# Patient Record
Sex: Female | Born: 1965
Health system: Southern US, Community
[De-identification: ages and names within clinical notes are randomized; demographics above are authoritative.]

## PROBLEM LIST (undated history)

## (undated) DIAGNOSIS — E039 Hypothyroidism, unspecified: Secondary | ICD-10-CM

## (undated) DIAGNOSIS — K219 Gastro-esophageal reflux disease without esophagitis: Secondary | ICD-10-CM

## (undated) HISTORY — DX: Hypothyroidism, unspecified: E03.9

---

## 1988-07-07 HISTORY — PX: CERVICAL CONE BIOPSY: SUR198

## 1997-07-07 HISTORY — PX: TUBAL LIGATION: SHX77

## 2003-07-08 HISTORY — PX: BREAST BIOPSY: SHX20

## 2007-01-06 ENCOUNTER — Ambulatory Visit: Payer: Self-pay | Admitting: Obstetrics and Gynecology

## 2009-01-22 LAB — HM COLONOSCOPY: HM Colonoscopy: NORMAL

## 2009-03-29 ENCOUNTER — Ambulatory Visit: Payer: Self-pay | Admitting: Sports Medicine

## 2009-04-10 ENCOUNTER — Ambulatory Visit: Payer: Self-pay | Admitting: Obstetrics and Gynecology

## 2009-11-13 ENCOUNTER — Ambulatory Visit: Payer: Self-pay | Admitting: Obstetrics and Gynecology

## 2009-11-16 ENCOUNTER — Ambulatory Visit: Payer: Self-pay | Admitting: Obstetrics and Gynecology

## 2010-07-07 HISTORY — PX: NOVASURE ABLATION: SHX5394

## 2011-02-10 ENCOUNTER — Ambulatory Visit: Payer: Self-pay | Admitting: Obstetrics and Gynecology

## 2012-02-17 ENCOUNTER — Ambulatory Visit: Payer: Self-pay | Admitting: Obstetrics and Gynecology

## 2013-05-12 LAB — TSH: TSH: 2.19 u[IU]/mL (ref 0.41–5.90)

## 2013-05-25 ENCOUNTER — Ambulatory Visit: Payer: Self-pay | Admitting: Gastroenterology

## 2013-11-14 ENCOUNTER — Encounter: Payer: Self-pay | Admitting: Podiatry

## 2013-11-14 ENCOUNTER — Ambulatory Visit (INDEPENDENT_AMBULATORY_CARE_PROVIDER_SITE_OTHER): Payer: BC Managed Care – PPO

## 2013-11-14 ENCOUNTER — Ambulatory Visit (INDEPENDENT_AMBULATORY_CARE_PROVIDER_SITE_OTHER): Payer: BC Managed Care – PPO | Admitting: Podiatry

## 2013-11-14 DIAGNOSIS — M79609 Pain in unspecified limb: Secondary | ICD-10-CM

## 2013-11-14 DIAGNOSIS — M79673 Pain in unspecified foot: Secondary | ICD-10-CM

## 2013-11-14 DIAGNOSIS — M6740 Ganglion, unspecified site: Secondary | ICD-10-CM

## 2013-11-14 DIAGNOSIS — M722 Plantar fascial fibromatosis: Secondary | ICD-10-CM

## 2013-11-14 DIAGNOSIS — M67472 Ganglion, left ankle and foot: Secondary | ICD-10-CM

## 2013-11-14 NOTE — Progress Notes (Signed)
   Subjective:    Patient ID: Bethany Bailey, female    DOB: Mar 11, 1966, 48 y.o.   MRN: [id number]  HPI Comments: i did have pain on the top of my left foot. It doesn't hurt today. The pain in random. The pain has been off and on for 3 weeks. Its basically stopped hurting. i dont do anything for my foot   Foot Pain      Review of Systems  All other systems reviewed and are negative.      Objective:   Physical Exam: I reviewed her past medical history medications allergies surgeries social history and review of systems. Pulses are strongly palpable bilateral. Deep tendon reflexes are intact bilateral muscle strength is 5 over 5 dorsiflexors plantar flexors inverters everters all intrinsic musculature is intact. Orthopedic evaluation demonstrates all joints distal to the ankle a full range of motion without crepitus. Has a small dorsal exostosis to the base of the first metatarsal with an overlying ganglion cyst. This is more than likely associated with past trauma and irritation due to shoe gear. She also has a history of plantar fasciitis with mild tenderness on palpation. Radiographic evaluation does demonstrate dorsal spurring and soft tissue increase in density overlying the area in question.        Assessment & Plan:  Assessment: Plantar fasciitis bilateral. Dorsal tarsal exostosis with overlying ganglion left foot.  Plan: Scant today for orthotics. Injection dexamethasone and local anesthetic to the point of maximal tenderness left foot.

## 2013-11-15 DIAGNOSIS — K2970 Gastritis, unspecified, without bleeding: Secondary | ICD-10-CM | POA: Insufficient documentation

## 2013-11-15 DIAGNOSIS — R12 Heartburn: Secondary | ICD-10-CM | POA: Insufficient documentation

## 2013-11-15 DIAGNOSIS — K449 Diaphragmatic hernia without obstruction or gangrene: Secondary | ICD-10-CM | POA: Insufficient documentation

## 2013-11-22 ENCOUNTER — Encounter: Payer: Self-pay | Admitting: General Surgery

## 2013-11-24 ENCOUNTER — Encounter: Payer: Self-pay | Admitting: *Deleted

## 2013-11-24 NOTE — Progress Notes (Signed)
Sent pt post card letting her know orthotics are here. 

## 2013-12-12 ENCOUNTER — Encounter: Payer: Self-pay | Admitting: General Surgery

## 2013-12-12 ENCOUNTER — Ambulatory Visit (INDEPENDENT_AMBULATORY_CARE_PROVIDER_SITE_OTHER): Payer: BC Managed Care – PPO | Admitting: General Surgery

## 2013-12-12 VITALS — BP 126/76 | HR 76 | Resp 12 | Ht 65.0 in | Wt 193.0 lb

## 2013-12-12 DIAGNOSIS — R1031 Right lower quadrant pain: Secondary | ICD-10-CM

## 2013-12-12 NOTE — Patient Instructions (Addendum)
Patient may take three 200mg  ibuprofen  three times daily. Patient to call and let us know how you are doing in two weeks.

## 2013-12-12 NOTE — Progress Notes (Signed)
aPatient ID: Verne Grain, female   DOB: August 16, 1965, 48 y.o.   MRN: [id number]  Chief Complaint  Patient presents with  . Other    inguinal hernia    HPI Bethany Bailey is a 48 y.o. female here today for a evaluation of an bilateral inguinal hernia. Patient states she lifted some heavy material on about one year ago. She states she feel sharp burning pain occasionally in her left groin area. In the right inguinal area she states she feel pain daily.   The patient reports since  August 2014 she's had a constant throbbing pain in the right groin, especially when she lifts her leg. Once it develops it willing her throughout the day. On rare occasions the discomfort has been present when arising in the morning. She is not appreciated an increase in frequency or severity. This does not interfere with her daily activities. No triggering event is recalled.  She occasionally has some burning discomfort in the left groin, but this is intermittent and does not appear to be related to those symptoms in the right groin. No difficulty with bowel bladder function. No change in appetite.  HPI  Past Medical History  Diagnosis Date  . Thyroid disease   . Gastritis   . Hypothyroid     Past Surgical History  Procedure Laterality Date  . Tubal ligation  1999  . Novasure ablation  2012  . Colonoscopy      more than five years ago    No family history on file.  Social History History  Substance Use Topics  . Smoking status: Never Smoker   . Smokeless tobacco: Never Used  . Alcohol Use: Yes    Allergies  Allergen Reactions  . Aspirin Other (See Comments)    Ears ring  . Erythromycin Nausea And Vomiting  . Sulfa Antibiotics Rash    Current Outpatient Prescriptions  Medication Sig Dispense Refill  . Black Cohosh 175 MG CAPS Take by mouth.      . cholecalciferol (VITAMIN D) 400 UNITS TABS tablet Take 400 Units by mouth daily.      . cyclobenzaprine (FLEXERIL) 10 MG tablet Take 1 tablet by mouth  daily.      Marland Kitchen ESTRACE VAGINAL 0.1 MG/GM vaginal cream       . levothyroxine (SYNTHROID, LEVOTHROID) 100 MCG tablet Take 100 mcg by mouth daily before breakfast.      . Omega-3 Fatty Acids (FISH OIL) 1000 MG CAPS Take 1 capsule by mouth daily.      . Ranitidine HCl (ZANTAC PO) Take by mouth 2 (two) times daily.       No current facility-administered medications for this visit.    Review of Systems Review of Systems  Constitutional: Negative.   Respiratory: Negative.   Cardiovascular: Negative.     Blood pressure 126/76, pulse 76, resp. rate 12, height 5\' 5"  (1.651 m), weight 193 lb (87.544 kg), last menstrual period 12/11/2013.  Physical Exam Physical Exam  Constitutional: She is oriented to person, place, and time. She appears well-developed and well-nourished.  Eyes: Conjunctivae are normal. No scleral icterus.  Neck: Neck supple.  Cardiovascular: Normal rate, regular rhythm and normal heart sounds.   Pulmonary/Chest: Effort normal and breath sounds normal.  Abdominal: Soft. Normal appearance and bowel sounds are normal. There is no tenderness. No hernia.  Neurological: She is alert and oriented to person, place, and time.  Skin: Skin is warm and dry.  Inguinal/femoral exam was completed in the supine and standing position.  No weakness with cough or Valsalva maneuver was identified. No thickening along the round ligament. Tenderness is appreciated along the pubic tubercle right more so than left.  Data Reviewed GYN notes of Nov 22, 2013.  Assessment    Likely musculoskeletal strain rather than hernia.     Plan    The patient has been asked to make use of a trial of ibuprofen 600 mg p.o. T.i.d. For a two-week trial. A phone followup has been requested. Further assessment will be made based on her response to this trial period the need to make use of a small amount of food to minimize GI upset with the ibuprofen was reviewed.     PCP: Faythe Casa Ref. Dr. Erven Colla Burman [place] 12/13/2013, 6:54 PM

## 2013-12-13 DIAGNOSIS — R1031 Right lower quadrant pain: Secondary | ICD-10-CM | POA: Insufficient documentation

## 2013-12-15 ENCOUNTER — Ambulatory Visit (INDEPENDENT_AMBULATORY_CARE_PROVIDER_SITE_OTHER): Payer: BC Managed Care – PPO | Admitting: Podiatry

## 2013-12-15 VITALS — BP 128/88 | HR 79 | Resp 16

## 2013-12-15 DIAGNOSIS — M722 Plantar fascial fibromatosis: Secondary | ICD-10-CM

## 2013-12-15 DIAGNOSIS — M7750 Other enthesopathy of unspecified foot: Secondary | ICD-10-CM

## 2013-12-15 DIAGNOSIS — M779 Enthesopathy, unspecified: Secondary | ICD-10-CM

## 2013-12-15 DIAGNOSIS — M778 Other enthesopathies, not elsewhere classified: Secondary | ICD-10-CM

## 2013-12-15 MED ORDER — METHYLPREDNISOLONE (PAK) 4 MG PO TABS: ORAL_TABLET | ORAL | Status: DC

## 2013-12-16 NOTE — Progress Notes (Signed)
She presents today to pick up orthotics.  Objective: She tried the orthotics on a like to have the top cover changed. We discussed the pain in her left foot.  Assessment: Chronic plantar fasciitis and capsulitis.  Plan: Send back her orthotics and top cover changed followup with her once those come in.

## 2013-12-29 ENCOUNTER — Telehealth: Payer: Self-pay | Admitting: *Deleted

## 2013-12-29 NOTE — Telephone Encounter (Signed)
Called and told pt orthotics are here and told pt she can come by and pick them up, no appt needed.

## 2014-01-25 ENCOUNTER — Telehealth: Payer: Self-pay | Admitting: *Deleted

## 2014-01-25 NOTE — Telephone Encounter (Signed)
Message copied by Dierdre Searles on Wed Jan 25, 2014  2:56 PM ------      Message from: Valda Favia L      Created: Wed Jan 25, 2014  8:19 AM      Regarding: Medicine refill      Contact: [phone]       Patient stated that she is still in a lot of pain and would like to know if Dr. Milinda Pointer would give her a refill on the Medrol dospack before her appointment on 02/09/14. Please call ------

## 2014-01-25 NOTE — Telephone Encounter (Signed)
Spoke to patient and explained that dr Milinda Pointer was not in office that she should schedule an appointment with him regarding the pain she is having

## 2014-01-30 ENCOUNTER — Ambulatory Visit: Payer: Self-pay | Admitting: Podiatry

## 2014-02-09 ENCOUNTER — Ambulatory Visit (INDEPENDENT_AMBULATORY_CARE_PROVIDER_SITE_OTHER): Payer: BC Managed Care – PPO | Admitting: Podiatry

## 2014-02-09 VITALS — BP 146/90 | HR 93 | Resp 16

## 2014-02-09 DIAGNOSIS — M722 Plantar fascial fibromatosis: Secondary | ICD-10-CM

## 2014-02-09 NOTE — Progress Notes (Signed)
She presents today for followup of her orthotics. She states the right orthotic feels much better however the left foot which is walking on the outer edge. It is making her foot rolled to the side.  Objective: I evaluated the orthotics today and agreed that it is inverted.  Assessment: Plantar fasciitis treatment with orthotics.  Plan: We're sending the left orthotic back to have inverting heels noted.

## 2014-03-14 ENCOUNTER — Telehealth: Payer: Self-pay | Admitting: *Deleted

## 2014-03-14 DIAGNOSIS — M722 Plantar fascial fibromatosis: Secondary | ICD-10-CM

## 2014-03-14 NOTE — Telephone Encounter (Signed)
Pt wants to order 2nd pair of orthotics just like last pair and with the adjustment made to the left orthotic. Spoke with alex at [facility] and ordered 2nd pair with instructions abt adjustment. Told pt ins should cover 2nd pair and if not will be a charge of 199.00. Pt understood.

## 2014-03-31 ENCOUNTER — Telehealth: Payer: Self-pay | Admitting: *Deleted

## 2014-03-31 NOTE — Telephone Encounter (Signed)
Called and spoke with Bethany Bailey regarding 2nd pair of orthotics. Said production had not made 2nd pair yet and he will put in another request for the 2nd pair to be made.

## 2014-06-14 ENCOUNTER — Ambulatory Visit (INDEPENDENT_AMBULATORY_CARE_PROVIDER_SITE_OTHER): Payer: BC Managed Care – PPO | Admitting: Podiatry

## 2014-06-14 ENCOUNTER — Encounter: Payer: Self-pay | Admitting: Podiatry

## 2014-06-14 DIAGNOSIS — M722 Plantar fascial fibromatosis: Secondary | ICD-10-CM

## 2014-06-14 NOTE — Progress Notes (Signed)
Bethany Bailey presents today for follow-up of her orthotic she states they were doing great for a while and then started to make many of my foot and everything else start her I evaluated the orthotics for her today and it appears that the arch maybe a little bit high due to the density of the top cover. I sent this back to have the arch drop in the density removed and I will follow-up with her once those come in.

## 2014-09-11 ENCOUNTER — Ambulatory Visit (INDEPENDENT_AMBULATORY_CARE_PROVIDER_SITE_OTHER): Payer: BLUE CROSS/BLUE SHIELD | Admitting: Podiatry

## 2014-09-11 ENCOUNTER — Encounter: Payer: Self-pay | Admitting: Podiatry

## 2014-09-11 DIAGNOSIS — M722 Plantar fascial fibromatosis: Secondary | ICD-10-CM

## 2014-09-11 NOTE — Progress Notes (Signed)
She presents today for follow-up of her orthotics for her history of plantar fasciitis and is still unhappy with her new orthotics. She states she just does not get the heel control and then do not feel as comfortable.  Objective: Vital signs are stable she's alert and oriented 3. The orthotics appear to be exactly the same as her previous orthotics with exception of an eighth inch heel lift.  Assessment: Continued pain.  Plan: Bethany Bailey going to request a pair of orthotics be made is likely orthotics from her previous provider. We will also request these orthotics be tweaked.

## 2014-11-10 ENCOUNTER — Telehealth: Payer: Self-pay | Admitting: Podiatry

## 2014-11-10 NOTE — Telephone Encounter (Signed)
This pt called wanting to order more orthotics from [facility]

## 2014-11-10 NOTE — Telephone Encounter (Signed)
[phone] need two more Ever                    Please call her about ordering 2 pair [facility]

## 2014-11-23 LAB — HM MAMMOGRAPHY: HM MAMMO: NORMAL

## 2014-11-27 NOTE — Telephone Encounter (Signed)
This is a [place] Patient 

## 2014-11-27 NOTE — Telephone Encounter (Signed)
Sent [facility] an email stating patient wanted to order another pair of orthotics. Will contact patient when we receive them in the office.

## 2014-12-11 ENCOUNTER — Telehealth: Payer: Self-pay | Admitting: *Deleted

## 2014-12-11 NOTE — Telephone Encounter (Signed)
Called patient to let her know that her orthotics are here and ready for pick up

## 2015-01-11 ENCOUNTER — Ambulatory Visit: Payer: Self-pay | Admitting: Nurse Practitioner

## 2015-01-23 ENCOUNTER — Ambulatory Visit (INDEPENDENT_AMBULATORY_CARE_PROVIDER_SITE_OTHER): Payer: BLUE CROSS/BLUE SHIELD | Admitting: Nurse Practitioner

## 2015-01-23 ENCOUNTER — Encounter: Payer: Self-pay | Admitting: Nurse Practitioner

## 2015-01-23 ENCOUNTER — Encounter (INDEPENDENT_AMBULATORY_CARE_PROVIDER_SITE_OTHER): Payer: Self-pay

## 2015-01-23 VITALS — BP 130/80 | HR 80 | Temp 98.5°F | Resp 16 | Ht 64.0 in | Wt 178.2 lb

## 2015-01-23 DIAGNOSIS — Z7689 Persons encountering health services in other specified circumstances: Secondary | ICD-10-CM | POA: Insufficient documentation

## 2015-01-23 DIAGNOSIS — Z7251 High risk heterosexual behavior: Secondary | ICD-10-CM | POA: Diagnosis not present

## 2015-01-23 DIAGNOSIS — Z1239 Encounter for other screening for malignant neoplasm of breast: Secondary | ICD-10-CM

## 2015-01-23 DIAGNOSIS — Z7189 Other specified counseling: Secondary | ICD-10-CM

## 2015-01-23 LAB — HM PAP SMEAR: HM Pap smear: NORMAL

## 2015-01-23 NOTE — Assessment & Plan Note (Signed)
Discussed acute and chronic issues. Reviewed health maintenance measures, PFSHx, and immunizations. Obtain records from [place]

## 2015-01-23 NOTE — Assessment & Plan Note (Signed)
Pt reports multiple female partners and multiple orifices involved in sexual activity. Will obtain GC/Chlamydia and STD panel. No specific concerns, contacts, or symptoms.

## 2015-01-23 NOTE — Progress Notes (Signed)
Subjective:    Patient ID: Bethany Bailey, female    DOB: 31-May-1966, 49 y.o.   MRN: [id number]  HPI  [name] is a 49 yo female establishing care today and CC of requesting STD testing and PAP.   1)New pt info:   Immunizations-   Mammogram- Past year   Pap- 2015  Bone Density- N/A  Colonoscopy- 5-6 years   Eye Exam- UTD  Dental Exam- UTD  LMP- 01/07/15- 2-3 days 2) Chronic Problems-  Hypothyroidism- 100 mcg daily, Dr. Eddie Dibbles for endocrinology  Gastritis- Resolved   3) Acute Problems-  Multiple partners   Review of Systems  Constitutional: Negative for fever, chills, diaphoresis and fatigue.  Respiratory: Negative for chest tightness, shortness of breath and wheezing.   Cardiovascular: Negative for chest pain, palpitations and leg swelling.  Gastrointestinal: Negative for nausea, vomiting, diarrhea and constipation.  Genitourinary: Negative for hematuria, decreased urine volume, vaginal bleeding, vaginal discharge, difficulty urinating, genital sores, vaginal pain, menstrual problem and pelvic pain.  Musculoskeletal: Negative for back pain and neck pain.  Skin: Negative for rash.  Neurological: Negative for dizziness, weakness, numbness and headaches.  Hematological: Does not bruise/bleed easily.  Psychiatric/Behavioral: Negative for suicidal ideas and sleep disturbance. The patient is nervous/anxious.        Stable on Lexapro   Past Medical History  Diagnosis Date  . Gastritis   . Thyroid disease   . Hypothyroid     History   Social History  . Marital Status: Married    Spouse Name: N/A  . Number of Children: N/A  . Years of Education: N/A   Occupational History  . Not on file.   Social History Main Topics  . Smoking status: Never Smoker   . Smokeless tobacco: Never Used  . Alcohol Use: 0.0 oz/week    0 Standard drinks or equivalent per week     Comment: Occasional   . Drug Use: No  . Sexual Activity:    Partners: Male     Comment: Multiple    Other  Topics Concern  . 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 Tubal ligation  1999  . Novasure ablation  2012  . Colonoscopy      more than five years ago    Family History  Problem Relation Age of Onset  . Diabetes Mother   . Hypothyroidism Mother   . Hypothyroidism Son   . Diabetes Maternal Uncle   . Dementia Maternal Grandmother   . Stroke Maternal Grandmother   . Diabetes Maternal Grandfather   . Alzheimer's disease Paternal Grandmother     Allergies  Allergen Reactions  . Aspirin Other (See Comments)    Ears ring  . Erythromycin Nausea And Vomiting  . Sulfa Antibiotics Rash    Current Outpatient Prescriptions on File Prior to Visit  Medication Sig Dispense Refill  . Black Cohosh 175 MG CAPS Take by mouth.    . cholecalciferol (VITAMIN D) 400 UNITS TABS tablet Take 400 Units by mouth daily.    . cyclobenzaprine (FLEXERIL) 10 MG tablet Take 1 tablet by mouth daily.    Marland Kitchen levothyroxine (SYNTHROID, LEVOTHROID) 100 MCG tablet Take 100 mcg by mouth daily before breakfast.    . Omega-3 Fatty Acids (FISH OIL)  1000 MG CAPS Take 1 capsule by mouth daily.     No current facility-administered medications on file prior to visit.       Objective:   Physical Exam  Constitutional: She is oriented to person, place, and time. She appears well-developed and well-nourished. No distress.  BP 130/80 mmHg  Pulse 80  Temp(Src) 98.5 F (36.9 C)  Resp 16  Ht 5\' 4"  (1.626 m)  Wt 178 lb 3.2 oz (80.831 kg)  BMI 30.57 kg/m2  SpO2 96%   HENT:  Head: Normocephalic and atraumatic.  Right Ear: External ear normal.  Left Ear: External ear normal.  TM's clear bilaterally  Eyes: EOM are normal. Pupils are equal, round, and reactive to light. Right eye exhibits no discharge. Left eye exhibits no discharge. No scleral  icterus.  Neck: Normal range of motion. Neck supple. No thyromegaly present.  Cardiovascular: Normal rate, regular rhythm and normal heart sounds.   Pulmonary/Chest: Effort normal and breath sounds normal. No respiratory distress. She has no wheezes. She has no rales. She exhibits no tenderness.  Abdominal: Soft. Bowel sounds are normal. She exhibits no distension and no mass. There is no tenderness. There is no rebound and no guarding.  Musculoskeletal: Normal range of motion. She exhibits no edema or tenderness.  Lymphadenopathy:    She has no cervical adenopathy.  Neurological: She is alert and oriented to person, place, and time. No cranial nerve deficit. She exhibits normal muscle tone. Coordination normal.  Skin: Skin is warm and dry. No rash noted. She is not diaphoretic.  Psychiatric: She has a normal mood and affect. Her behavior is normal. Judgment and thought content normal.      Assessment & Plan:

## 2015-01-23 NOTE — Patient Instructions (Signed)
Please visit the lab before leaving today and we will contact you with your results.   Welcome to [facility]!

## 2015-01-23 NOTE — Progress Notes (Signed)
Pre visit review using our clinic review tool, if applicable. No additional management support is needed unless otherwise documented below in the visit note. 

## 2015-01-24 LAB — STD PANEL
HEP B S AG: NEGATIVE
HIV 1&2 Ab, 4th Generation: NONREACTIVE

## 2015-01-24 LAB — GC/CHLAMYDIA PROBE AMP, URINE
Chlamydia, Swab/Urine, PCR: NEGATIVE
GC PROBE AMP, URINE: NEGATIVE

## 2015-09-17 LAB — HM MAMMOGRAPHY

## 2015-09-19 ENCOUNTER — Encounter: Payer: Self-pay | Admitting: Nurse Practitioner

## 2015-10-30 DIAGNOSIS — R6882 Decreased libido: Secondary | ICD-10-CM | POA: Diagnosis not present

## 2015-10-30 DIAGNOSIS — N943 Premenstrual tension syndrome: Secondary | ICD-10-CM | POA: Diagnosis not present

## 2015-10-30 DIAGNOSIS — Z6833 Body mass index (BMI) 33.0-33.9, adult: Secondary | ICD-10-CM | POA: Diagnosis not present

## 2016-01-01 DIAGNOSIS — N943 Premenstrual tension syndrome: Secondary | ICD-10-CM | POA: Diagnosis not present

## 2016-01-01 DIAGNOSIS — F5231 Female orgasmic disorder: Secondary | ICD-10-CM | POA: Diagnosis not present

## 2016-01-01 DIAGNOSIS — N926 Irregular menstruation, unspecified: Secondary | ICD-10-CM | POA: Diagnosis not present

## 2016-02-06 ENCOUNTER — Encounter: Payer: BLUE CROSS/BLUE SHIELD | Admitting: Family Medicine

## 2016-02-07 ENCOUNTER — Encounter: Payer: Self-pay | Admitting: Family Medicine

## 2016-02-07 ENCOUNTER — Other Ambulatory Visit (HOSPITAL_COMMUNITY)
Admission: RE | Admit: 2016-02-07 | Discharge: 2016-02-07 | Disposition: A | Discharge status: Home / Self Care | Payer: BLUE CROSS/BLUE SHIELD | Source: Ambulatory Visit | Attending: Family Medicine | Admitting: Family Medicine

## 2016-02-07 ENCOUNTER — Ambulatory Visit (INDEPENDENT_AMBULATORY_CARE_PROVIDER_SITE_OTHER): Payer: BLUE CROSS/BLUE SHIELD | Admitting: Family Medicine

## 2016-02-07 VITALS — BP 132/86 | HR 80 | Temp 98.7°F | Ht 66.0 in | Wt 193.5 lb

## 2016-02-07 DIAGNOSIS — Z01419 Encounter for gynecological examination (general) (routine) without abnormal findings: Secondary | ICD-10-CM

## 2016-02-07 DIAGNOSIS — Z13220 Encounter for screening for lipoid disorders: Secondary | ICD-10-CM | POA: Diagnosis not present

## 2016-02-07 DIAGNOSIS — E039 Hypothyroidism, unspecified: Secondary | ICD-10-CM

## 2016-02-07 DIAGNOSIS — Z113 Encounter for screening for infections with a predominantly sexual mode of transmission: Secondary | ICD-10-CM | POA: Insufficient documentation

## 2016-02-07 DIAGNOSIS — Z130 Encounter for screening for diseases of the blood and blood-forming organs and certain disorders involving the immune mechanism: Secondary | ICD-10-CM

## 2016-02-07 DIAGNOSIS — Z7251 High risk heterosexual behavior: Secondary | ICD-10-CM | POA: Diagnosis not present

## 2016-02-07 LAB — CBC
HCT: 36.7 % (ref 36.0–46.0)
Hemoglobin: 12.4 g/dL (ref 12.0–15.0)
MCHC: 33.8 g/dL (ref 30.0–36.0)
MCV: 88.9 fl (ref 78.0–100.0)
Platelets: 259 10*3/uL (ref 150.0–400.0)
RBC: 4.13 Mil/uL (ref 3.87–5.11)
RDW: 13.9 % (ref 11.5–15.5)
WBC: 8.1 10*3/uL (ref 4.0–10.5)

## 2016-02-07 LAB — COMPREHENSIVE METABOLIC PANEL
ALK PHOS: 36 U/L — AB (ref 39–117)
ALT: 26 U/L (ref 0–35)
AST: 16 U/L (ref 0–37)
Albumin: 4.2 g/dL (ref 3.5–5.2)
BILIRUBIN TOTAL: 0.3 mg/dL (ref 0.2–1.2)
BUN: 17 mg/dL (ref 6–23)
CO2: 29 meq/L (ref 19–32)
Calcium: 9.3 mg/dL (ref 8.4–10.5)
Chloride: 105 mEq/L (ref 96–112)
Creatinine, Ser: 0.86 mg/dL (ref 0.40–1.20)
GFR: 74.7 mL/min (ref >60.00)
GLUCOSE: 87 mg/dL (ref 70–99)
Potassium: 4.1 mEq/L (ref 3.5–5.1)
SODIUM: 140 meq/L (ref 135–145)
TOTAL PROTEIN: 7.2 g/dL (ref 6.0–8.3)

## 2016-02-07 LAB — LIPID PANEL
CHOL/HDL RATIO: 3
Cholesterol: 199 mg/dL (ref 0–200)
HDL: 61.1 mg/dL (ref >39.00)
LDL Cholesterol: 100 mg/dL — ABNORMAL HIGH (ref 0–99)
NONHDL: 137.72
Triglycerides: 190 mg/dL — ABNORMAL HIGH (ref 0.0–149.0)
VLDL: 38 mg/dL (ref 0.0–40.0)

## 2016-02-07 LAB — TSH: TSH: 2.24 u[IU]/mL (ref 0.35–4.50)

## 2016-02-07 NOTE — Progress Notes (Signed)
Pre visit review using our clinic review tool, if applicable. No additional management support is needed unless otherwise documented below in the visit note. 

## 2016-02-07 NOTE — Progress Notes (Signed)
Subjective:  Patient ID: KORTLYN KOLTZ, female    DOB: 11-Jun-1966  Age: 50 y.o. MRN: [id number]  CC: Annual physical/pap smear  HPI RANDA RISS is a 50 y.o. female presents to the clinic today for an annual physical.  Preventative Healthcare  Pap smear: States that she is in need of. States last one was in 2014 or 2015.  Mammogram: Up-to-date.  Colonoscopy: Not indicated.   Immunizations  Tetanus - Up to date.   Labs: Screening labs today.  Alcohol use: Occ.  Smoking/tobacco use: Nonsmoker.  STD/HIV testing: Reports unprotected sexual intercourse. Desires testing today.  PMH, Surgical Hx, Family Hx, Social History reviewed and updated as below.  Past Medical History:  Diagnosis Date  . Hypothyroid    Past Surgical History:  Procedure Laterality Date  . [place]  2012  . TUBAL LIGATION  1999   Family History  Problem Relation Age of Onset  . Diabetes Mother   . Hypothyroidism Mother   . Diabetes Maternal Uncle   . Dementia Maternal Grandmother   . Stroke Maternal Grandmother   . Diabetes Maternal Grandfather   . Alzheimer's disease Paternal Grandmother   . Hypothyroidism Son    Social History  Substance Use Topics  . Smoking status: Never Smoker  . Smokeless tobacco: Never Used  . Alcohol use 0.0 oz/week     Comment: Occasional    Review of Systems  Genitourinary:       Sexual difficulty - difficulty obtaining orgasm.  All other systems reviewed and are negative.  Objective:   Today's Vitals: BP 132/86 (BP Location: Left Arm, Patient Position: Sitting, Cuff Size: Normal)   Pulse 80   Temp 98.7 F (37.1 C) (Oral)   Ht 5' 6"  (1.676 m)   Wt 193 lb 8 oz (87.8 kg)   SpO2 95%   BMI 31.23 kg/m   Physical Exam  Constitutional: She is oriented to person, place, and time. She appears well-developed and well-nourished. No distress.  HENT:  Head: Normocephalic and atraumatic.  Mouth/Throat: Oropharynx is clear and moist. No  oropharyngeal exudate.  Normal TM's bilaterally.   Eyes: Conjunctivae are normal. No scleral icterus.  Neck: Neck supple.  Cardiovascular: Normal rate and regular rhythm.   No murmur heard. Pulmonary/Chest: Effort normal and breath sounds normal. She has no wheezes. She has no rales.  Abdominal: Soft. She exhibits no distension. There is no tenderness. There is no rebound and no guarding.  Genitourinary:  Genitourinary Comments: Pelvic Exam: External: normal female genitalia without lesions or masses Vagina: green discharge noted. Cervix: normal without lesions or masses. Pap smear: performed   Musculoskeletal: Normal range of motion. She exhibits no edema.  Lymphadenopathy:    She has no cervical adenopathy.  Neurological: She is alert and oriented to person, place, and time.  Skin: Skin is warm and dry. No rash noted.  Psychiatric:  Flat affect.  Vitals reviewed.  Assessment & Plan:   Problem List Items Addressed This Visit    Hypothyroidism   Relevant Orders   Comp Met (CMET)   TSH   Well woman exam with routine gynecological exam - Primary    Pap smear performed today. Mammogram up-to-date. Labs today. STD testing given multiple partners and unprotected sexual intercourse. Advised use of condoms.       Other Visit Diagnoses    Screening for deficiency anemia       Relevant Orders   CBC   Screening, lipid  Relevant Orders   Lipid Profile   Unprotected sexual intercourse       Relevant Orders   HIV antibody (with reflex)   RPR   Cytology - PAP      Outpatient Encounter Prescriptions as of 02/07/2016  Medication Sig  . Black Cohosh 175 MG CAPS Take by mouth.  . cholecalciferol (VITAMIN D) 400 UNITS TABS tablet Take 400 Units by mouth daily.  . cyclobenzaprine (FLEXERIL) 10 MG tablet Take 1 tablet by mouth as needed.   Marland Kitchen DHEA 10 MG CAPS Take 10 mg by mouth daily.  Marland Kitchen levothyroxine (SYNTHROID, LEVOTHROID) 100 MCG tablet Take 100 mcg by mouth daily before  breakfast.  . Omega-3 Fatty Acids (FISH OIL) 1000 MG CAPS Take 1 capsule by mouth daily.  . Progesterone Micronized (PROGESTERONE PO) Take by mouth daily.   No facility-administered encounter medications on file as of 02/07/2016.     Follow-up: Annually.  [place]

## 2016-02-07 NOTE — Patient Instructions (Signed)
Follow up annually. We will call with your lab results  Take care  Dr. Lacinda Axon   Health Maintenance, Female Adopting a healthy lifestyle and getting preventive care can go a long way to promote health and wellness. Talk with your health care provider about what schedule of regular examinations is right for you. This is a good chance for you to check in with your provider about disease prevention and staying healthy. In between checkups, there are plenty of things you can do on your own. Experts have done a lot of research about which lifestyle changes and preventive measures are most likely to keep you healthy. Ask your health care provider for more information. WEIGHT AND DIET  Eat a healthy diet  Be sure to include plenty of vegetables, fruits, low-fat dairy products, and lean protein.  Do not eat a lot of foods high in solid fats, added sugars, or salt.  Get regular exercise. This is one of the most important things you can do for your health.  Most adults should exercise for at least 150 minutes each week. The exercise should increase your heart rate and make you sweat (moderate-intensity exercise).  Most adults should also do strengthening exercises at least twice a week. This is in addition to the moderate-intensity exercise.  Maintain a healthy weight  Body mass index (BMI) is a measurement that can be used to identify possible weight problems. It estimates body fat based on height and weight. Your health care provider can help determine your BMI and help you achieve or maintain a healthy weight.  For females 15 years of age and older:   A BMI below 18.5 is considered underweight.  A BMI of 18.5 to 24.9 is normal.  A BMI of 25 to 29.9 is considered overweight.  A BMI of 30 and above is considered obese.  Watch levels of cholesterol and blood lipids  You should start having your blood tested for lipids and cholesterol at 48 years of age, then have this test every 5  years.  You may need to have your cholesterol levels checked more often if:  Your lipid or cholesterol levels are high.  You are older than 48 years of age.  You are at high risk for heart disease.  CANCER SCREENING   Lung Cancer  Lung cancer screening is recommended for adults 5-76 years old who are at high risk for lung cancer because of a history of smoking.  A yearly low-dose CT scan of the lungs is recommended for people who:  Currently smoke.  Have quit within the past 15 years.  Have at least a 30-pack-year history of smoking. A pack year is smoking an average of one pack of cigarettes a day for 1 year.  Yearly screening should continue until it has been 15 years since you quit.  Yearly screening should stop if you develop a health problem that would prevent you from having lung cancer treatment.  Breast Cancer  Practice breast self-awareness. This means understanding how your breasts normally appear and feel.  It also means doing regular breast self-exams. Let your health care provider know about any changes, no matter how small.  If you are in your 20s or 30s, you should have a clinical breast exam (CBE) by a health care provider every 1-3 years as part of a regular health exam.  If you are 28 or older, have a CBE every year. Also consider having a breast X-ray (mammogram) every year.  If you have  a family history of breast cancer, talk to your health care provider about genetic screening.  If you are at high risk for breast cancer, talk to your health care provider about having an MRI and a mammogram every year.  Breast cancer gene (BRCA) assessment is recommended for women who have family members with BRCA-related cancers. BRCA-related cancers include:  Breast.  Ovarian.  Tubal.  Peritoneal cancers.  Results of the assessment will determine the need for genetic counseling and BRCA1 and BRCA2 testing. Cervical Cancer Your health care provider may  recommend that you be screened regularly for cancer of the pelvic organs (ovaries, uterus, and vagina). This screening involves a pelvic examination, including checking for microscopic changes to the surface of your cervix (Pap test). You may be encouraged to have this screening done every 3 years, beginning at age 21.  For women ages 30-65, health care providers may recommend pelvic exams and Pap testing every 3 years, or they may recommend the Pap and pelvic exam, combined with testing for human papilloma virus (HPV), every 5 years. Some types of HPV increase your risk of cervical cancer. Testing for HPV may also be done on women of any age with unclear Pap test results.  Other health care providers may not recommend any screening for nonpregnant women who are considered low risk for pelvic cancer and who do not have symptoms. Ask your health care provider if a screening pelvic exam is right for you.  If you have had past treatment for cervical cancer or a condition that could lead to cancer, you need Pap tests and screening for cancer for at least 20 years after your treatment. If Pap tests have been discontinued, your risk factors (such as having a new sexual partner) need to be reassessed to determine if screening should resume. Some women have medical problems that increase the chance of getting cervical cancer. In these cases, your health care provider may recommend more frequent screening and Pap tests. Colorectal Cancer  This type of cancer can be detected and often prevented.  Routine colorectal cancer screening usually begins at 48 years of age and continues through 48 years of age.  Your health care provider may recommend screening at an earlier age if you have risk factors for colon cancer.  Your health care provider may also recommend using home test kits to check for hidden blood in the stool.  A small camera at the end of a tube can be used to examine your colon directly  (sigmoidoscopy or colonoscopy). This is done to check for the earliest forms of colorectal cancer.  Routine screening usually begins at age 50.  Direct examination of the colon should be repeated every 5-10 years through 48 years of age. However, you may need to be screened more often if early forms of precancerous polyps or small growths are found. Skin Cancer  Check your skin from head to toe regularly.  Tell your health care provider about any new moles or changes in moles, especially if there is a change in a mole's shape or color.  Also tell your health care provider if you have a mole that is larger than the size of a pencil eraser.  Always use sunscreen. Apply sunscreen liberally and repeatedly throughout the day.  Protect yourself by wearing long sleeves, pants, a wide-brimmed hat, and sunglasses whenever you are outside. HEART DISEASE, DIABETES, AND HIGH BLOOD PRESSURE   High blood pressure causes heart disease and increases the risk of stroke. High   blood pressure is more likely to develop in:  People who have blood pressure in the high end of the normal range (130-139/85-89 mm Hg).  People who are overweight or obese.  People who are African American.  If you are 18-39 years of age, have your blood pressure checked every 3-5 years. If you are 40 years of age or older, have your blood pressure checked every year. You should have your blood pressure measured twice--once when you are at a hospital or clinic, and once when you are not at a hospital or clinic. Record the average of the two measurements. To check your blood pressure when you are not at a hospital or clinic, you can use:  An automated blood pressure machine at a pharmacy.  A home blood pressure monitor.  If you are between 55 years and 79 years old, ask your health care provider if you should take aspirin to prevent strokes.  Have regular diabetes screenings. This involves taking a blood sample to check your  fasting blood sugar level.  If you are at a normal weight and have a low risk for diabetes, have this test once every three years after 48 years of age.  If you are overweight and have a high risk for diabetes, consider being tested at a younger age or more often. PREVENTING INFECTION  Hepatitis B  If you have a higher risk for hepatitis B, you should be screened for this virus. You are considered at high risk for hepatitis B if:  You were born in a country where hepatitis B is common. Ask your health care provider which countries are considered high risk.  Your parents were born in a high-risk country, and you have not been immunized against hepatitis B (hepatitis B vaccine).  You have HIV or AIDS.  You use needles to inject street drugs.  You live with someone who has hepatitis B.  You have had sex with someone who has hepatitis B.  You get hemodialysis treatment.  You take certain medicines for conditions, including cancer, organ transplantation, and autoimmune conditions. Hepatitis C  Blood testing is recommended for:  Everyone born from 1945 through 1965.  Anyone with known risk factors for hepatitis C. Sexually transmitted infections (STIs)  You should be screened for sexually transmitted infections (STIs) including gonorrhea and chlamydia if:  You are sexually active and are younger than 48 years of age.  You are older than 48 years of age and your health care provider tells you that you are at risk for this type of infection.  Your sexual activity has changed since you were last screened and you are at an increased risk for chlamydia or gonorrhea. Ask your health care provider if you are at risk.  If you do not have HIV, but are at risk, it may be recommended that you take a prescription medicine daily to prevent HIV infection. This is called pre-exposure prophylaxis (PrEP). You are considered at risk if:  You are sexually active and do not regularly use condoms or  know the HIV status of your partner(s).  You take drugs by injection.  You are sexually active with a partner who has HIV. Talk with your health care provider about whether you are at high risk of being infected with HIV. If you choose to begin PrEP, you should first be tested for HIV. You should then be tested every 3 months for as long as you are taking PrEP.  PREGNANCY   If you are   If you are premenopausal and you may become pregnant, ask your health care provider about preconception counseling.  If you may become pregnant, take 400 to 800 micrograms (mcg) of folic acid every day.  If you want to prevent pregnancy, talk to your health care provider about birth control (contraception). OSTEOPOROSIS AND MENOPAUSE   Osteoporosis is a disease in which the bones lose minerals and strength with aging. This can result in serious bone fractures. Your risk for osteoporosis can be identified using a bone density scan.  If you are 65 years of age or older, or if you are at risk for osteoporosis and fractures, ask your health care provider if you should be screened.  Ask your health care provider whether you should take a calcium or vitamin D supplement to lower your risk for osteoporosis.  Menopause may have certain physical symptoms and risks.  Hormone replacement therapy may reduce some of these symptoms and risks. Talk to your health care provider about whether hormone replacement therapy is right for you.  HOME CARE INSTRUCTIONS   Schedule regular health, dental, and eye exams.  Stay current with your immunizations.   Do not use any tobacco products including cigarettes, chewing tobacco, or electronic cigarettes.  If you are pregnant, do not drink alcohol.  If you are breastfeeding, limit how much and how often you drink alcohol.  Limit alcohol intake to no more than 1 drink per day for nonpregnant women. One drink equals 12 ounces of beer, 5 ounces of wine, or 1 ounces of hard  liquor.  Do not use street drugs.  Do not share needles.  Ask your health care provider for help if you need support or information about quitting drugs.  Tell your health care provider if you often feel depressed.  Tell your health care provider if you have ever been abused or do not feel safe at home.   This information is not intended to replace advice given to you by your health care provider. Make sure you discuss any questions you have with your health care provider.   Document Released: 01/06/2011 Document Revised: 07/14/2014 Document Reviewed: 05/25/2013 Elsevier Interactive Patient Education 2016 [employer]  

## 2016-02-07 NOTE — Assessment & Plan Note (Signed)
Pap smear performed today. Mammogram up-to-date. Labs today. STD testing given multiple partners and unprotected sexual intercourse. Advised use of condoms.

## 2016-02-08 LAB — HIV ANTIBODY (ROUTINE TESTING W REFLEX): HIV: NONREACTIVE

## 2016-02-08 LAB — CYTOLOGY - PAP

## 2016-02-08 LAB — RPR

## 2016-02-11 ENCOUNTER — Telehealth: Payer: Self-pay | Admitting: Nurse Practitioner

## 2016-02-11 NOTE — Telephone Encounter (Signed)
Pt called returning your call regarding lab results. Thank you!

## 2016-02-11 NOTE — Telephone Encounter (Signed)
Labs were given

## 2016-04-02 DIAGNOSIS — M545 Low back pain: Secondary | ICD-10-CM | POA: Diagnosis not present

## 2016-04-02 DIAGNOSIS — M5441 Lumbago with sciatica, right side: Secondary | ICD-10-CM | POA: Diagnosis not present

## 2016-04-09 DIAGNOSIS — G8929 Other chronic pain: Secondary | ICD-10-CM | POA: Diagnosis not present

## 2016-04-09 DIAGNOSIS — M545 Low back pain: Secondary | ICD-10-CM | POA: Diagnosis not present

## 2016-04-15 DIAGNOSIS — M545 Low back pain: Secondary | ICD-10-CM | POA: Diagnosis not present

## 2016-04-15 DIAGNOSIS — G8929 Other chronic pain: Secondary | ICD-10-CM | POA: Diagnosis not present

## 2016-04-17 DIAGNOSIS — G8929 Other chronic pain: Secondary | ICD-10-CM | POA: Diagnosis not present

## 2016-04-17 DIAGNOSIS — M545 Low back pain: Secondary | ICD-10-CM | POA: Diagnosis not present

## 2016-04-22 DIAGNOSIS — G8929 Other chronic pain: Secondary | ICD-10-CM | POA: Diagnosis not present

## 2016-04-22 DIAGNOSIS — M545 Low back pain: Secondary | ICD-10-CM | POA: Diagnosis not present

## 2016-04-24 DIAGNOSIS — G8929 Other chronic pain: Secondary | ICD-10-CM | POA: Diagnosis not present

## 2016-04-24 DIAGNOSIS — M545 Low back pain: Secondary | ICD-10-CM | POA: Diagnosis not present

## 2016-04-29 DIAGNOSIS — G8929 Other chronic pain: Secondary | ICD-10-CM | POA: Diagnosis not present

## 2016-04-29 DIAGNOSIS — M545 Low back pain: Secondary | ICD-10-CM | POA: Diagnosis not present

## 2016-05-12 DIAGNOSIS — J069 Acute upper respiratory infection, unspecified: Secondary | ICD-10-CM | POA: Diagnosis not present

## 2016-05-20 DIAGNOSIS — J069 Acute upper respiratory infection, unspecified: Secondary | ICD-10-CM | POA: Diagnosis not present

## 2016-05-20 DIAGNOSIS — R6889 Other general symptoms and signs: Secondary | ICD-10-CM | POA: Diagnosis not present

## 2016-06-24 DIAGNOSIS — G8929 Other chronic pain: Secondary | ICD-10-CM | POA: Diagnosis not present

## 2016-06-24 DIAGNOSIS — R6882 Decreased libido: Secondary | ICD-10-CM | POA: Diagnosis not present

## 2016-06-24 DIAGNOSIS — E039 Hypothyroidism, unspecified: Secondary | ICD-10-CM | POA: Diagnosis not present

## 2016-06-24 DIAGNOSIS — M545 Low back pain: Secondary | ICD-10-CM | POA: Diagnosis not present

## 2016-08-05 DIAGNOSIS — H1089 Other conjunctivitis: Secondary | ICD-10-CM | POA: Diagnosis not present

## 2016-09-09 ENCOUNTER — Encounter: Payer: Self-pay | Admitting: Family

## 2016-09-09 ENCOUNTER — Ambulatory Visit (INDEPENDENT_AMBULATORY_CARE_PROVIDER_SITE_OTHER): Payer: BLUE CROSS/BLUE SHIELD | Admitting: Family

## 2016-09-09 VITALS — BP 130/78 | HR 89 | Temp 98.4°F | Wt 190.6 lb

## 2016-09-09 DIAGNOSIS — M799 Soft tissue disorder, unspecified: Secondary | ICD-10-CM | POA: Diagnosis not present

## 2016-09-09 DIAGNOSIS — M7989 Other specified soft tissue disorders: Secondary | ICD-10-CM

## 2016-09-09 NOTE — Progress Notes (Signed)
Pre visit review using our clinic review tool, if applicable. No additional management support is needed unless otherwise documented below in the visit note. 

## 2016-09-09 NOTE — Patient Instructions (Signed)
[place] of abdomen.   Let me know if changes in size or new symptoms present

## 2016-09-09 NOTE — Progress Notes (Signed)
Subjective:    Patient ID: Bethany Bailey, female    DOB: 1965/08/20, 51 y.o.   MRN: [id number]  CC: Bethany Bailey is a 51 y.o. female who presents today for an acute visit.    HPI: CC: 'knot in stomach' below belly button x 3 days, unchanged. Nontender.   First noticed when rubbing abdomen over the weekend.  No fever, consitpation. Having regular BMs.        HISTORY:  Past Medical History:  Diagnosis Date  . Hypothyroid    Past Surgical History:  Procedure Laterality Date  . [place]  2012  . TUBAL LIGATION  1999   Family History  Problem Relation Age of Onset  . Diabetes Mother   . Hypothyroidism Mother   . Diabetes Maternal Uncle   . Dementia Maternal Grandmother   . Stroke Maternal Grandmother   . Diabetes Maternal Grandfather   . Alzheimer's disease Paternal Grandmother   . Hypothyroidism Son     Allergies: Aspirin; Erythromycin; and Sulfa antibiotics Current Outpatient Prescriptions on File Prior to Visit  Medication Sig Dispense Refill  . Black Cohosh 175 MG CAPS Take by mouth.    . cholecalciferol (VITAMIN D) 400 UNITS TABS tablet Take 400 Units by mouth daily.    . cyclobenzaprine (FLEXERIL) 10 MG tablet Take 1 tablet by mouth as needed.     Marland Kitchen DHEA 10 MG CAPS Take 10 mg by mouth daily.    Marland Kitchen levothyroxine (SYNTHROID, LEVOTHROID) 100 MCG tablet Take 100 mcg by mouth daily before breakfast.    . Progesterone Micronized (PROGESTERONE PO) Take by mouth daily.     No current facility-administered medications on file prior to visit.     Social History  Substance Use Topics  . Smoking status: Never Smoker  . Smokeless tobacco: Never Used  . Alcohol use 0.0 oz/week     Comment: Occasional     Review of Systems  Constitutional: Negative for chills, fever and unexpected weight change.  Respiratory: Negative for cough.   Cardiovascular: Negative for chest pain and palpitations.  Gastrointestinal: Negative for abdominal distention, constipation,  nausea and vomiting.  Genitourinary: Negative for dysuria.      Objective:    BP 130/78 (BP Location: Left Arm, Patient Position: Sitting, Cuff Size: Large)   Pulse 89   Temp 98.4 F (36.9 C) (Oral)   Wt 190 lb 9.6 oz (86.5 kg)   SpO2 99%   BMI 30.76 kg/m    Physical Exam  Constitutional: She appears well-developed and well-nourished.  Eyes: Conjunctivae are normal.  Cardiovascular: Normal rate, regular rhythm, normal heart sounds and normal pulses.   Pulmonary/Chest: Effort normal and breath sounds normal. She has no wheezes. She has no rhonchi. She has no rales.  Abdominal: Soft. Normal appearance and bowel sounds are normal. She exhibits no distension, no fluid wave, no ascites and no mass. There is no tenderness. There is no rigidity, no rebound, no guarding and no CVA tenderness.    Palpable soft tissue mass as noted on diagram. Nontender. Non fluctuant. No erythema, streaking.  Neurological: She is alert.  Skin: Skin is warm and dry.  Psychiatric: She has a normal mood and affect. Her speech is normal and behavior is normal. Thought content normal.  Vitals reviewed.      Assessment & Plan:   1. Soft tissue mass Working diagnosis of lipoma. Pending soft tissue ultrasound to further evaluate. Reassured by no systemic features.  - [place] Misc Soft Tissue; Future  I have discontinued Ms. Drinkard's Fish Oil. I am also having her maintain her levothyroxine, cyclobenzaprine, Black Cohosh, cholecalciferol, DHEA, and Progesterone Micronized (PROGESTERONE PO).   No orders of the defined types were placed in this encounter.   Return precautions given.   Risks, benefits, and alternatives of the medications and treatment plan prescribed today were discussed, and patient expressed understanding.   Education regarding symptom management and diagnosis given to patient on AVS.  Continue to follow with Mable Paris, FNP for routine health maintenance.   Merilyn Baba and  I agreed with plan.   Mable Paris, FNP

## 2016-09-16 ENCOUNTER — Ambulatory Visit
Admission: RE | Admit: 2016-09-16 | Discharge: 2016-09-16 | Disposition: A | Discharge status: Home / Self Care | Payer: BLUE CROSS/BLUE SHIELD | Source: Ambulatory Visit | Attending: Family | Admitting: Family

## 2016-09-16 DIAGNOSIS — D259 Leiomyoma of uterus, unspecified: Secondary | ICD-10-CM | POA: Diagnosis not present

## 2016-09-16 DIAGNOSIS — R1905 Periumbilic swelling, mass or lump: Secondary | ICD-10-CM | POA: Diagnosis not present

## 2016-09-16 DIAGNOSIS — M799 Soft tissue disorder, unspecified: Secondary | ICD-10-CM | POA: Diagnosis not present

## 2016-09-16 DIAGNOSIS — M7989 Other specified soft tissue disorders: Secondary | ICD-10-CM

## 2016-09-17 DIAGNOSIS — Z79890 Hormone replacement therapy (postmenopausal): Secondary | ICD-10-CM | POA: Diagnosis not present

## 2016-09-17 DIAGNOSIS — N926 Irregular menstruation, unspecified: Secondary | ICD-10-CM | POA: Diagnosis not present

## 2016-09-17 DIAGNOSIS — E039 Hypothyroidism, unspecified: Secondary | ICD-10-CM | POA: Diagnosis not present

## 2016-09-17 DIAGNOSIS — Z6832 Body mass index (BMI) 32.0-32.9, adult: Secondary | ICD-10-CM | POA: Diagnosis not present

## 2016-09-17 DIAGNOSIS — D259 Leiomyoma of uterus, unspecified: Secondary | ICD-10-CM | POA: Diagnosis not present

## 2016-09-18 ENCOUNTER — Ambulatory Visit (INDEPENDENT_AMBULATORY_CARE_PROVIDER_SITE_OTHER): Payer: BLUE CROSS/BLUE SHIELD | Admitting: Obstetrics and Gynecology

## 2016-09-18 ENCOUNTER — Encounter: Payer: Self-pay | Admitting: Obstetrics and Gynecology

## 2016-09-18 VITALS — BP 161/88 | HR 88 | Ht 66.0 in | Wt 188.4 lb

## 2016-09-18 DIAGNOSIS — D251 Intramural leiomyoma of uterus: Secondary | ICD-10-CM | POA: Diagnosis not present

## 2016-09-18 DIAGNOSIS — R102 Pelvic and perineal pain: Secondary | ICD-10-CM

## 2016-09-18 NOTE — Progress Notes (Signed)
HPI:      [name] is a 51 y.o. G0P0000 who Bethany Bailey was Patient's last menstrual period was 08/27/2016 (exact date).  Subjective:   She presents today  With complaint of abdominal pain and palpable abdominal mass. She discovered this a few weeks ago. She has had an ultrasound revealing a very large uterus with uterine fibroids. She is having regular normal menses at this time. Her menses are controlled because she underwent an endometrial ablation 6 years ago for menorrhagia. No family history of breast colon or ovarian cancer. The pelvic pain is affecting her adenopathy that she is requesting definitive management.    Hx: The following portions of the patient's history were reviewed and updated as appropriate:              She  has a past medical history of Hypothyroid. She  does not have any pertinent problems on file. She  has a past surgical history that includes Tubal ligation (1999); Novasure ablation (2012); and Cervical cone biopsy (1990). Her family history includes Alzheimer's disease in her paternal grandmother; Dementia in her maternal grandmother; Diabetes in her maternal grandfather, maternal uncle, and mother; Hypothyroidism in her mother and son; Stroke in her maternal grandmother. She  reports that she has never smoked. She has never used smokeless tobacco. She reports that she drinks alcohol. She reports that she does not use drugs. Current Outpatient Prescriptions on File Prior to Visit  Medication Sig Dispense Refill  . Black Cohosh 175 MG CAPS Take by mouth.    . cholecalciferol (VITAMIN D) 400 UNITS TABS tablet Take 400 Units by mouth daily.    . cyclobenzaprine (FLEXERIL) 10 MG tablet Take 1 tablet by mouth as needed.     Marland Kitchen levothyroxine (SYNTHROID, LEVOTHROID) 100 MCG tablet Take 100 mcg by mouth daily before breakfast.    . Progesterone Micronized (PROGESTERONE PO) Take by mouth daily.    Marland Kitchen DHEA 10 MG CAPS Take 10 mg by mouth daily.     No current  facility-administered medications on file prior to visit.          Review of Systems:  Review of Systems  Constitutional: Denied constitutional symptoms, night sweats, recent illness, fatigue, fever, insomnia and weight loss.  Eyes: Denied eye symptoms, eye pain, photophobia, vision change and visual disturbance.  Ears/Nose/Throat/Neck: Denied ear, nose, throat or neck symptoms, hearing loss, nasal discharge, sinus congestion and sore throat.  Cardiovascular: Denied cardiovascular symptoms, arrhythmia, chest pain/pressure, edema, exercise intolerance, orthopnea and palpitations.  Respiratory: Denied pulmonary symptoms, asthma, pleuritic pain, productive sputum, cough, dyspnea and wheezing.  Gastrointestinal: Denied, gastro-esophageal reflux, melena, nausea and vomiting.  Genitourinary: See HPI for additional information.  Musculoskeletal: Denied musculoskeletal symptoms, stiffness, swelling, muscle weakness and myalgia.  Dermatologic: Denied dermatology symptoms, rash and scar.  Neurologic: Denied neurology symptoms, dizziness, headache, neck pain and syncope.  Psychiatric: Denied psychiatric symptoms, anxiety and depression.  Endocrine: Denied endocrine symptoms including hot flashes and night sweats.   Meds:   Current Outpatient Prescriptions on File Prior to Visit  Medication Sig Dispense Refill  . Black Cohosh 175 MG CAPS Take by mouth.    . cholecalciferol (VITAMIN D) 400 UNITS TABS tablet Take 400 Units by mouth daily.    . cyclobenzaprine (FLEXERIL) 10 MG tablet Take 1 tablet by mouth as needed.     Marland Kitchen levothyroxine (SYNTHROID, LEVOTHROID) 100 MCG tablet Take 100 mcg by mouth daily before breakfast.    . Progesterone Micronized (PROGESTERONE PO) Take  by mouth daily.    Marland Kitchen DHEA 10 MG CAPS Take 10 mg by mouth daily.     No current facility-administered medications on file prior to visit.     Objective:     Vitals:   09/18/16 0824  BP: (!) 161/88  Pulse: 88               Abdominal examination reveals a palpable mass at the level of the umbilicus. This is consistent with ultrasound findings. Ultrasound findings reviewed with the patient.  Assessment:    G0P0000 Patient Active Problem List   Diagnosis Date Noted  . Hypothyroidism 02/07/2016  . Well woman exam with routine gynecological exam 02/07/2016  . High risk heterosexual behavior 01/23/2015     1. Fibroids, intramural   2. Pelvic pain in female     Fibroids causing pelvic pain. Multiple options discussed with the patient including the possibility of delaying until menopause with the realization that uterine fibroids will decrease in size. Patient to uncomfortable for this.   Plan:            1.  Schedule for surgery. TAH/BSO.       F/U  Return in about 2 weeks (around 10/02/2016). I spent 32 minutes with this patient of which greater than 50% was spent discussing surgery, uterine fibroids, pelvic pain, hormone replacement.  Finis Bud, M.D. 09/18/2016 9:23 AM

## 2016-09-23 ENCOUNTER — Telehealth: Payer: Self-pay

## 2016-09-23 ENCOUNTER — Encounter: Payer: BLUE CROSS/BLUE SHIELD | Admitting: Obstetrics and Gynecology

## 2016-09-23 NOTE — Telephone Encounter (Signed)
Left a message on patients voice mail- disability papers are ready for pickup. Up front in call center.

## 2016-09-30 ENCOUNTER — Encounter: Payer: Self-pay | Admitting: Obstetrics and Gynecology

## 2016-10-01 ENCOUNTER — Ambulatory Visit (INDEPENDENT_AMBULATORY_CARE_PROVIDER_SITE_OTHER): Payer: BLUE CROSS/BLUE SHIELD | Admitting: Obstetrics and Gynecology

## 2016-10-01 ENCOUNTER — Telehealth: Payer: Self-pay | Admitting: *Deleted

## 2016-10-01 ENCOUNTER — Encounter: Payer: Self-pay | Admitting: Obstetrics and Gynecology

## 2016-10-01 VITALS — BP 126/73 | HR 98 | Wt 189.2 lb

## 2016-10-01 DIAGNOSIS — Z01818 Encounter for other preprocedural examination: Secondary | ICD-10-CM | POA: Diagnosis not present

## 2016-10-01 DIAGNOSIS — R102 Pelvic and perineal pain: Secondary | ICD-10-CM

## 2016-10-01 DIAGNOSIS — D251 Intramural leiomyoma of uterus: Secondary | ICD-10-CM

## 2016-10-01 DIAGNOSIS — N760 Acute vaginitis: Secondary | ICD-10-CM

## 2016-10-01 DIAGNOSIS — B9689 Other specified bacterial agents as the cause of diseases classified elsewhere: Secondary | ICD-10-CM

## 2016-10-01 MED ORDER — METRONIDAZOLE 500 MG PO TABS: 500.0000 mg | ORAL_TABLET | Freq: Two times a day (BID) | ORAL | 0 refills | Status: AC

## 2016-10-01 NOTE — H&P (Signed)
PRE-OPERATIVE HISTORY AND PHYSICAL EXAM  PCP:  Mable Paris, FNP Subjective:   HPI:  Bethany Bailey is a 51 y.o. G0P0000.  Patient's last menstrual period was 09/23/2016 (exact date).  She presents today for a pre-op discussion and PE.  She has the following symptoms:  Pelvic pain, large uterine fibroids.  Review of Systems:   Constitutional: Denied constitutional symptoms, night sweats, recent illness, fatigue, fever, insomnia and weight loss.  Eyes: Denied eye symptoms, eye pain, photophobia, vision change and visual disturbance.  Ears/Nose/Throat/Neck: Denied ear, nose, throat or neck symptoms, hearing loss, nasal discharge, sinus congestion and sore throat.  Cardiovascular: Denied cardiovascular symptoms, arrhythmia, chest pain/pressure, edema, exercise intolerance, orthopnea and palpitations.  Respiratory: Denied pulmonary symptoms, asthma, pleuritic pain, productive sputum, cough, dyspnea and wheezing.  Gastrointestinal: Denied, gastro-esophageal reflux, melena, nausea and vomiting.  Genitourinary: See history of present illness and office notes  Musculoskeletal: Denied musculoskeletal symptoms, stiffness, swelling, muscle weakness and myalgia.  Dermatologic: Denied dermatology symptoms, rash and scar.  Neurologic: Denied neurology symptoms, dizziness, headache, neck pain and syncope.  Psychiatric: Denied psychiatric symptoms, anxiety and depression.  Endocrine: Denied endocrine symptoms including hot flashes and night sweats.   OB History  Gravida Para Term Preterm AB Living  0 0 0 0 0 0  SAB TAB Ectopic Multiple Live Births  0 0 0 0          Past Medical History:  Diagnosis Date  . Hypothyroid     Past Surgical History:  Procedure Laterality Date  . CERVICAL CONE BIOPSY  1990  . [place]  2012  . TUBAL LIGATION  1999      SOCIAL HISTORY: History  Smoking Status  . Never Smoker  Smokeless Tobacco  . Never Used   History  Alcohol Use  .  0.0 oz/week    Comment: Occasional    History  Drug Use No    Family History  Problem Relation Age of Onset  . Diabetes Mother   . Hypothyroidism Mother   . Diabetes Maternal Uncle   . Dementia Maternal Grandmother   . Stroke Maternal Grandmother   . Diabetes Maternal Grandfather   . Alzheimer's disease Paternal Grandmother   . Hypothyroidism Son     ALLERGIES:  Aspirin; Erythromycin; Food; and Sulfa antibiotics  MEDS:   Current Outpatient Prescriptions on File Prior to Visit  Medication Sig Dispense Refill  . cyclobenzaprine (FLEXERIL) 10 MG tablet Take 10 mg by mouth 3 (three) times daily as needed for muscle spasms.     Marland Kitchen levothyroxine (SYNTHROID, LEVOTHROID) 100 MCG tablet Take 100 mcg by mouth daily before breakfast.    . BLACK COHOSH PO Take 1 tablet by mouth 2 (two) times daily.    . Cholecalciferol (VITAMIN D-3) 5000 units TABS Take 5,000 Units by mouth daily with supper.    Marland Kitchen ibuprofen (ADVIL,MOTRIN) 200 MG tablet Take 400 mg by mouth every 8 (eight) hours as needed (for pain/headache.).     No current facility-administered medications on file prior to visit.     Meds ordered this encounter  Medications  . metroNIDAZOLE (FLAGYL) 500 MG tablet    Sig: Take 1 tablet (500 mg total) by mouth 2 (two) times daily.    Dispense:  14 tablet    Refill:  0     Physical examination BP 126/73   Pulse 98   Wt 189 lb 4 oz (85.8 kg)   LMP 09/23/2016 (Exact Date)  BMI 30.55 kg/m   General NAD, Conversant  HEENT Atraumatic; Op clear with mmm.  Normo-cephalic. Pupils reactive. Anicteric sclerae  Thyroid/Neck Smooth without nodularity or enlargement. Normal ROM.  Neck Supple.  Skin No rashes, lesions or ulceration. Normal palpated skin turgor. No nodularity.  Breasts: No masses or discharge.  Symmetric.  No axillary adenopathy.  Lungs: Clear to auscultation.No rales or wheezes. Normal Respiratory effort, no retractions.  Heart: NSR.  No murmurs or rubs appreciated. No  periferal edema  Abdomen: Soft.  Non-tender.  Large abdominal mass consistent with uterine fibroids  No HSM. No hernia  Extremities: Moves all appropriately.  Normal ROM for age. No lymphadenopathy.  Neuro: Oriented to PPT.  Normal mood. Normal affect.     Pelvic:   Vulva: Normal appearance.  No lesions.  Vagina: No lesions or abnormalities noted. Copious vaginal discharge   Support: Normal pelvic support.  Urethra No masses tenderness or scarring.  Meatus Normal size without lesions or prolapse.  Cervix: Normal ectropion.  No lesions.  Anus: Normal exam.  No lesions.  Perineum: Normal exam.  No lesions.        Bimanual   Uterus: 19 weeks size.  Non-tender.  Mobile.  AV.  Adnexae: No masses.  Non-tender to palpation.  Cul-de-sac: Negative for abnormality.   Assessment:   G0P0000 Patient Active Problem List   Diagnosis Date Noted  . Hypothyroidism 02/07/2016  . Well woman exam with routine gynecological exam 02/07/2016  . High risk heterosexual behavior 01/23/2015    1. Preop examination   2. Fibroids, intramural   3. Pelvic pain in female   4. Bacterial vulvovaginitis      Plan:   1.  total abdominal hysterectomy, bilateral oophorectomy and bilateral salpingectomy    Finis Bud, M.D. 10/01/2016 10:27 AM

## 2016-10-01 NOTE — Progress Notes (Signed)
HPI:      [name] is a 51 y.o. G0P0000 who LMP was Patient's last menstrual period was 09/23/2016 (exact date).  Subjective:   She presents today For preop H&P for surgery. She has large uterine fibroids is experiencing pelvic pain. She has decided upon oophorectomy as well.    Hx: The following portions of the patient's history were reviewed and updated as appropriate:              She  has a past medical history of Hypothyroid. She  does not have any pertinent problems on file. She  has a past surgical history that includes Tubal ligation (1999); Novasure ablation (2012); and Cervical cone biopsy (1990). Her family history includes Alzheimer's disease in her paternal grandmother; Dementia in her maternal grandmother; Diabetes in her maternal grandfather, maternal uncle, and mother; Hypothyroidism in her mother and son; Stroke in her maternal grandmother. She  reports that she has never smoked. She has never used smokeless tobacco. She reports that she drinks alcohol. She reports that she does not use drugs. Current Outpatient Prescriptions on File Prior to Visit  Medication Sig Dispense Refill  . cyclobenzaprine (FLEXERIL) 10 MG tablet Take 10 mg by mouth 3 (three) times daily as needed for muscle spasms.     Marland Kitchen levothyroxine (SYNTHROID, LEVOTHROID) 100 MCG tablet Take 100 mcg by mouth daily before breakfast.    . BLACK COHOSH PO Take 1 tablet by mouth 2 (two) times daily.    . Cholecalciferol (VITAMIN D-3) 5000 units TABS Take 5,000 Units by mouth daily with supper.    Marland Kitchen ibuprofen (ADVIL,MOTRIN) 200 MG tablet Take 400 mg by mouth every 8 (eight) hours as needed (for pain/headache.).     No current facility-administered medications on file prior to visit.          Review of Systems:  Review of Systems  Constitutional: Denied constitutional symptoms, night sweats, recent illness, fatigue, fever, insomnia and weight loss.  Eyes: Denied eye symptoms, eye pain, photophobia,  vision change and visual disturbance.  Ears/Nose/Throat/Neck: Denied ear, nose, throat or neck symptoms, hearing loss, nasal discharge, sinus congestion and sore throat.  Cardiovascular: Denied cardiovascular symptoms, arrhythmia, chest pain/pressure, edema, exercise intolerance, orthopnea and palpitations.  Respiratory: Denied pulmonary symptoms, asthma, pleuritic pain, productive sputum, cough, dyspnea and wheezing.  Gastrointestinal: Denied, gastro-esophageal reflux, melena, nausea and vomiting.  Genitourinary: See HPI for additional information.  Musculoskeletal: Denied musculoskeletal symptoms, stiffness, swelling, muscle weakness and myalgia.  Dermatologic: Denied dermatology symptoms, rash and scar.  Neurologic: Denied neurology symptoms, dizziness, headache, neck pain and syncope.  Psychiatric: Denied psychiatric symptoms, anxiety and depression.  Endocrine: Denied endocrine symptoms including hot flashes and night sweats.   Meds:   Current Outpatient Prescriptions on File Prior to Visit  Medication Sig Dispense Refill  . cyclobenzaprine (FLEXERIL) 10 MG tablet Take 10 mg by mouth 3 (three) times daily as needed for muscle spasms.     Marland Kitchen levothyroxine (SYNTHROID, LEVOTHROID) 100 MCG tablet Take 100 mcg by mouth daily before breakfast.    . BLACK COHOSH PO Take 1 tablet by mouth 2 (two) times daily.    . Cholecalciferol (VITAMIN D-3) 5000 units TABS Take 5,000 Units by mouth daily with supper.    Marland Kitchen ibuprofen (ADVIL,MOTRIN) 200 MG tablet Take 400 mg by mouth every 8 (eight) hours as needed (for pain/headache.).     No current facility-administered medications on file prior to visit.     Objective:  Vitals:   10/01/16 0919  BP: 126/73  Pulse: 98            Physical examination General NAD, Conversant  HEENT Atraumatic; Op clear with mmm.  Normo-cephalic. Pupils reactive. Anicteric sclerae  Thyroid/Neck Smooth without nodularity or enlargement. Normal ROM.  Neck Supple.   Skin No rashes, lesions or ulceration. Normal palpated skin turgor. No nodularity.  Breasts: No masses or discharge.  Symmetric.  No axillary adenopathy.  Lungs: Clear to auscultation.No rales or wheezes. Normal Respiratory effort, no retractions.  Heart: NSR.  No murmurs or rubs appreciated. No periferal edema  Abdomen: Soft.  Non-tender.  Large infraumbilical mass consistent with uterine fibroids  No HSM. No hernia  Extremities: Moves all appropriately.  Normal ROM for age. No lymphadenopathy.  Neuro: Oriented to PPT.  Normal mood. Normal affect.     Pelvic:   Vulva: Normal appearance.  No lesions.  Vagina: No lesions or abnormalities noted. Copious discharge   Support: Normal pelvic support.  Urethra No masses tenderness or scarring.  Meatus Normal size without lesions or prolapse.  Cervix: Normal appearance.  No lesions.  Obviously status post procedure   Anus: Normal exam.  No lesions.  Perineum: Normal exam.  No lesions.        Bimanual   Uterus: 19 weeks size Non-tender.  Mobile.  AV.  Adnexae: No masses.  Non-tender to palpation.  Cul-de-sac: Negative for abnormality.   WET PREP: clue cells: present, KOH (yeast): negative, odor: present and trichomoniasis: negative Ph:  > 4.5    Assessment:    G0P0000 Patient Active Problem List   Diagnosis Date Noted  . Hypothyroidism 02/07/2016  . Well woman exam with routine gynecological exam 02/07/2016  . High risk heterosexual behavior 01/23/2015     1. Preop examination   2. Fibroids, intramural   3. Pelvic pain in female   4. Bacterial vulvovaginitis      Plan:            1.  TAH/BSO  TAH The procedure of Total Abdominal Hysterectomy was described to the patient in detail.  We reviewed the rationale for Hysterectomy and the patient was again informed of other non-surgical management possibilities for her condition.  She has considered these other options, and desires a Hysterectomy.  We have reviewed the fact that  Hysterectomy is permanent and that following the procedure she will not be able to become pregnant or bear children.  We have discussed the following risk factors specifically, and the patient has also been informed that additional complications not mentioned may develop;  damage to bowel, bladder, ureters, or to other internal organs, bleeding, infection and the risk from anesthesia.  We have discussed the procedure itself in detail and she has an informed understanding of this surgery.  We have also discussed the recovery period in which physical and sexual activity will be restricted for a varying degree of time, often 3 - 6 weeks.  I have answered all of her questions and I believe she has an informed understanding of Abdominal Hysterectomy. Oophorectomy The option of Oophorectomy has been discussed with the patient.  Detailed risk/benefits have been reviewed.  The risks discussed include, but are not limited to, hemorrhage, infection, damage to ureter or other internal organ, and Ovarian Remnant Syndrome.  The benefits include a significant decrease in the risk of Ovarian Cancer and in benign Ovarian disease.  The risk of Ovarian CA has been estimated at 1 in 46.  This is  a relatively small risk.  However, should Ovarian CA develop, it is often found late in the course of the disease.  We have also discussed the role of inheritance in the development of Ovarian disease.  Some women, who have close relatives with Ovarian CA, have a higher than 1 in 70 risk of Ovarian CA.  The benefits of Estrogen replacement therapy following Oophorectomy has been stressed.  If she is premenopausal, we have discussed the fact that this procedure will make her permanently sterile and that premature menopause will result if no ERT is begun.  I have answered all of her questions, and I believe that she has an adequate and informed understanding of the risks and benefits of Oophorectomy. Patient will like ovaries  removed. Special Emphasis regarding large fibroids Special emphasis was given to the patients increased risk for complications during surgery.  The following factors increase her surgical risk -- especially bleeding, infection and damage to other organs.  Each was discussed in detail.     Meds ordered this encounter  Medications  . metroNIDAZOLE (FLAGYL) 500 MG tablet    Sig: Take 1 tablet (500 mg total) by mouth 2 (two) times daily.    Dispense:  14 tablet    Refill:  0        F/U  Return for As Scheduled Post-op.  Finis Bud, M.D. 10/01/2016 10:20 AM

## 2016-10-01 NOTE — Telephone Encounter (Signed)
error 

## 2016-10-03 ENCOUNTER — Inpatient Hospital Stay: Admission: RE | Admit: 2016-10-03 | Payer: BLUE CROSS/BLUE SHIELD | Source: Ambulatory Visit

## 2016-10-06 ENCOUNTER — Encounter
Admission: RE | Admit: 2016-10-06 | Discharge: 2016-10-06 | Disposition: A | Discharge status: Home / Self Care | Payer: BLUE CROSS/BLUE SHIELD | Source: Ambulatory Visit | Attending: Obstetrics and Gynecology | Admitting: Obstetrics and Gynecology

## 2016-10-06 DIAGNOSIS — Z01812 Encounter for preprocedural laboratory examination: Secondary | ICD-10-CM | POA: Diagnosis not present

## 2016-10-06 DIAGNOSIS — D259 Leiomyoma of uterus, unspecified: Secondary | ICD-10-CM | POA: Insufficient documentation

## 2016-10-06 DIAGNOSIS — R102 Pelvic and perineal pain: Secondary | ICD-10-CM | POA: Diagnosis not present

## 2016-10-06 HISTORY — DX: Gastro-esophageal reflux disease without esophagitis: K21.9

## 2016-10-06 LAB — CBC
HEMATOCRIT: 40 % (ref 35.0–47.0)
Hemoglobin: 13.5 g/dL (ref 12.0–16.0)
MCH: 30 pg (ref 26.0–34.0)
MCHC: 33.9 g/dL (ref 32.0–36.0)
MCV: 88.7 fL (ref 80.0–100.0)
PLATELETS: 238 10*3/uL (ref 150–440)
RBC: 4.51 MIL/uL (ref 3.80–5.20)
RDW: 13.6 % (ref 11.5–14.5)
WBC: 7.1 10*3/uL (ref 3.6–11.0)

## 2016-10-06 LAB — TYPE AND SCREEN
ABO/RH(D): O NEG
Antibody Screen: NEGATIVE

## 2016-10-06 NOTE — Patient Instructions (Signed)
Your procedure is scheduled on: 10/10/16 Fri Report to Same Day Surgery 2nd floor medical mall [facility] Entrance-take elevator on left to 2nd floor.  Check in with surgery information desk.) To find out your arrival time please call [phone] between 1PM - 3PM on 10/09/16 Thurs  Remember: Instructions that are not followed completely may result in serious medical risk, up to and including death, or upon the discretion of your surgeon and anesthesiologist your surgery may need to be rescheduled.    _x___ 1. Do not eat food or drink liquids after midnight. No gum chewing or                              hard candies.     __x__ 2. No Alcohol for 24 hours before or after surgery.   __x__3. No Smoking for 24 prior to surgery.   ____  4. Bring all medications with you on the day of surgery if instructed.    __x__ 5. Notify your doctor if there is any change in your medical condition     (cold, fever, infections).     Do not wear jewelry, make-up, hairpins, clips or nail polish.  Do not wear lotions, powders, or perfumes. You may wear deodorant.  Do not shave 48 hours prior to surgery. Men may shave face and neck.  Do not bring valuables to the hospital.    [facility] is not responsible for any belongings or valuables.               Contacts, dentures or bridgework may not be worn into surgery.  Leave your suitcase in the car. After surgery it may be brought to your room.  For patients admitted to the hospital, discharge time is determined by your                       treatment team.   Patients discharged the day of surgery will not be allowed to drive home.  You will need someone to drive you home and stay with you the night of your procedure.    Please read over the following fact sheets that you were given:   [facility] Preparing for Surgery and or MRSA Information   _x___ Take anti-hypertensive (unless it includes a diuretic), cardiac, seizure, asthma,     anti-reflux and  psychiatric medicines. These include:  1. levothyroxine (SYNTHROID  2.ranitidine (ZANTAC) 150 MG tablet  3.  4.  5.  6.  ____Fleets enema or Magnesium Citrate as directed.   _x___ Use CHG Soap or sage wipes as directed on instruction sheet   ____ Use inhalers on the day of surgery and bring to hospital day of surgery  ____ Stop Metformin and Janumet 2 days prior to surgery.    ____ Take 1/2 of usual insulin dose the night before surgery and none on the morning     surgery.   _x___ Follow recommendations from Cardiologist, Pulmonologist or PCP regarding          stopping Aspirin, Coumadin, Pllavix ,Eliquis, Effient, or Pradaxa, and Pletal.  X____Stop Anti-inflammatories such as Advil, Aleve, Ibuprofen, Motrin, Naproxen, Naprosyn, Goodies powders or aspirin products. OK to take Tylenol and                          Celebrex.   _x___ Stop supplements until after surgery.  But may  continue Vitamin D, Vitamin B,       and multivitamin.   ____ Bring C-Pap to the hospital.

## 2016-10-09 MED ORDER — CEFAZOLIN SODIUM-DEXTROSE 2-4 GM/100ML-% IV SOLN
2.0000 g | INTRAVENOUS | Status: AC
  Administered 2016-10-10: 2 g via INTRAVENOUS

## 2016-10-10 ENCOUNTER — Inpatient Hospital Stay
Admission: RE | Admit: 2016-10-10 | Discharge: 2016-10-12 | DRG: 743 | Disposition: A | Discharge status: Home / Self Care | Payer: BLUE CROSS/BLUE SHIELD | Source: Ambulatory Visit | Attending: Obstetrics and Gynecology | Admitting: Obstetrics and Gynecology

## 2016-10-10 ENCOUNTER — Inpatient Hospital Stay: Payer: BLUE CROSS/BLUE SHIELD | Admitting: Anesthesiology

## 2016-10-10 ENCOUNTER — Encounter
Admission: RE | Disposition: A | Discharge status: Home / Self Care | Payer: Self-pay | Source: Ambulatory Visit | Attending: Obstetrics and Gynecology

## 2016-10-10 ENCOUNTER — Encounter: Payer: Self-pay | Admitting: *Deleted

## 2016-10-10 DIAGNOSIS — B9689 Other specified bacterial agents as the cause of diseases classified elsewhere: Secondary | ICD-10-CM | POA: Diagnosis present

## 2016-10-10 DIAGNOSIS — R140 Abdominal distension (gaseous): Secondary | ICD-10-CM | POA: Diagnosis not present

## 2016-10-10 DIAGNOSIS — R102 Pelvic and perineal pain: Secondary | ICD-10-CM | POA: Diagnosis not present

## 2016-10-10 DIAGNOSIS — N760 Acute vaginitis: Secondary | ICD-10-CM | POA: Diagnosis present

## 2016-10-10 DIAGNOSIS — K219 Gastro-esophageal reflux disease without esophagitis: Secondary | ICD-10-CM | POA: Diagnosis present

## 2016-10-10 DIAGNOSIS — N831 Corpus luteum cyst of ovary, unspecified side: Secondary | ICD-10-CM | POA: Diagnosis not present

## 2016-10-10 DIAGNOSIS — D251 Intramural leiomyoma of uterus: Secondary | ICD-10-CM | POA: Diagnosis not present

## 2016-10-10 DIAGNOSIS — D259 Leiomyoma of uterus, unspecified: Secondary | ICD-10-CM | POA: Diagnosis not present

## 2016-10-10 DIAGNOSIS — E039 Hypothyroidism, unspecified: Secondary | ICD-10-CM | POA: Diagnosis not present

## 2016-10-10 DIAGNOSIS — Z9889 Other specified postprocedural states: Secondary | ICD-10-CM

## 2016-10-10 DIAGNOSIS — N838 Other noninflammatory disorders of ovary, fallopian tube and broad ligament: Secondary | ICD-10-CM | POA: Diagnosis not present

## 2016-10-10 DIAGNOSIS — N8300 Follicular cyst of ovary, unspecified side: Secondary | ICD-10-CM | POA: Diagnosis not present

## 2016-10-10 HISTORY — PX: ABDOMINAL HYSTERECTOMY: SHX81

## 2016-10-10 LAB — ABO/RH: ABO/RH(D): O NEG

## 2016-10-10 LAB — HCG, QUANTITATIVE, PREGNANCY: hCG, Beta Chain, Quant, S: <1 m[IU]/mL (ref <5)

## 2016-10-10 LAB — POCT PREGNANCY, URINE: Preg Test, Ur: NEGATIVE

## 2016-10-10 SURGERY — HYSTERECTOMY, ABDOMINAL
Anesthesia: General | Laterality: Bilateral

## 2016-10-10 MED ORDER — LACTATED RINGERS IV SOLN: INTRAVENOUS | Status: DC

## 2016-10-10 MED ORDER — OXYCODONE HCL 5 MG/5ML PO SOLN: 5.0000 mg | Freq: Once | ORAL | Status: DC | PRN

## 2016-10-10 MED ORDER — KETOROLAC TROMETHAMINE 30 MG/ML IJ SOLN
INTRAMUSCULAR | Status: AC
  Filled 2016-10-10: qty 1

## 2016-10-10 MED ORDER — DEXTROSE IN LACTATED RINGERS 5 % IV SOLN
INTRAVENOUS | Status: DC
  Administered 2016-10-10 – 2016-10-11 (×5): via INTRAVENOUS

## 2016-10-10 MED ORDER — ONDANSETRON HCL 4 MG/2ML IJ SOLN
INTRAMUSCULAR | Status: DC | PRN
  Administered 2016-10-10: 4 mg via INTRAVENOUS

## 2016-10-10 MED ORDER — PROPOFOL 10 MG/ML IV BOLUS
INTRAVENOUS | Status: DC | PRN
  Administered 2016-10-10: 170 mg via INTRAVENOUS

## 2016-10-10 MED ORDER — OXYCODONE HCL 5 MG PO TABS: 5.0000 mg | ORAL_TABLET | Freq: Once | ORAL | Status: DC | PRN

## 2016-10-10 MED ORDER — ONDANSETRON HCL 4 MG PO TABS: 4.0000 mg | ORAL_TABLET | Freq: Four times a day (QID) | ORAL | Status: DC | PRN

## 2016-10-10 MED ORDER — PROPOFOL 10 MG/ML IV BOLUS
INTRAVENOUS | Status: AC
  Filled 2016-10-10: qty 20

## 2016-10-10 MED ORDER — DEXAMETHASONE SODIUM PHOSPHATE 10 MG/ML IJ SOLN
INTRAMUSCULAR | Status: AC
  Filled 2016-10-10: qty 1

## 2016-10-10 MED ORDER — DIPHENHYDRAMINE HCL 50 MG/ML IJ SOLN: 12.5000 mg | Freq: Four times a day (QID) | INTRAMUSCULAR | Status: DC | PRN

## 2016-10-10 MED ORDER — FENTANYL CITRATE (PF) 100 MCG/2ML IJ SOLN
INTRAMUSCULAR | Status: DC | PRN
  Administered 2016-10-10 (×2): 50 ug via INTRAVENOUS
  Administered 2016-10-10: 100 ug via INTRAVENOUS

## 2016-10-10 MED ORDER — LIDOCAINE HCL (PF) 2 % IJ SOLN
INTRAMUSCULAR | Status: AC
  Filled 2016-10-10: qty 2

## 2016-10-10 MED ORDER — ONDANSETRON HCL 4 MG/2ML IJ SOLN
4.0000 mg | Freq: Four times a day (QID) | INTRAMUSCULAR | Status: DC | PRN
  Administered 2016-10-10: 4 mg via INTRAVENOUS
  Filled 2016-10-10: qty 2

## 2016-10-10 MED ORDER — FENTANYL CITRATE (PF) 100 MCG/2ML IJ SOLN
INTRAMUSCULAR | Status: AC
  Filled 2016-10-10: qty 2

## 2016-10-10 MED ORDER — SUGAMMADEX SODIUM 200 MG/2ML IV SOLN
INTRAVENOUS | Status: DC | PRN
  Administered 2016-10-10: 160 mg via INTRAVENOUS

## 2016-10-10 MED ORDER — SIMETHICONE 80 MG PO CHEW
80.0000 mg | CHEWABLE_TABLET | Freq: Four times a day (QID) | ORAL | Status: DC | PRN
  Administered 2016-10-11 – 2016-10-12 (×4): 80 mg via ORAL
  Filled 2016-10-10 (×4): qty 1

## 2016-10-10 MED ORDER — LACTATED RINGERS IV SOLN
INTRAVENOUS | Status: DC
  Administered 2016-10-10: 07:00:00 via INTRAVENOUS

## 2016-10-10 MED ORDER — KETOROLAC TROMETHAMINE 30 MG/ML IJ SOLN
INTRAMUSCULAR | Status: DC | PRN
  Administered 2016-10-10: 30 mg via INTRAVENOUS

## 2016-10-10 MED ORDER — IBUPROFEN 600 MG PO TABS
600.0000 mg | ORAL_TABLET | Freq: Four times a day (QID) | ORAL | Status: DC | PRN
Start: 2016-10-10 — End: 2016-10-12

## 2016-10-10 MED ORDER — CEFAZOLIN SODIUM-DEXTROSE 2-4 GM/100ML-% IV SOLN
INTRAVENOUS | Status: AC
  Filled 2016-10-10: qty 100

## 2016-10-10 MED ORDER — DEXAMETHASONE SODIUM PHOSPHATE 10 MG/ML IJ SOLN
INTRAMUSCULAR | Status: DC | PRN
  Administered 2016-10-10: 10 mg via INTRAVENOUS

## 2016-10-10 MED ORDER — ROCURONIUM BROMIDE 100 MG/10ML IV SOLN
INTRAVENOUS | Status: DC | PRN
  Administered 2016-10-10: 10 mg via INTRAVENOUS
  Administered 2016-10-10: 40 mg via INTRAVENOUS
  Administered 2016-10-10: 20 mg via INTRAVENOUS
  Administered 2016-10-10 (×2): 10 mg via INTRAVENOUS

## 2016-10-10 MED ORDER — MORPHINE SULFATE 2 MG/ML IV SOLN
INTRAVENOUS | Status: DC
  Administered 2016-10-10: 1.5 mg via INTRAVENOUS
  Administered 2016-10-10: 7.5 mg via INTRAVENOUS
  Administered 2016-10-10: 11:00:00 via INTRAVENOUS
  Administered 2016-10-10: 16.5 mg via INTRAVENOUS
  Administered 2016-10-11: 4.5 mg via INTRAVENOUS
  Administered 2016-10-11: 1.5 mg via INTRAVENOUS
  Administered 2016-10-11: 4.5 mg via INTRAVENOUS
  Administered 2016-10-11: 1.5 mg via INTRAVENOUS
  Filled 2016-10-10: qty 30

## 2016-10-10 MED ORDER — FENTANYL CITRATE (PF) 100 MCG/2ML IJ SOLN
INTRAMUSCULAR | Status: AC
Start: 2016-10-10 — End: 2016-10-10
  Filled 2016-10-10: qty 2

## 2016-10-10 MED ORDER — MIDAZOLAM HCL 2 MG/2ML IJ SOLN
INTRAMUSCULAR | Status: DC | PRN
  Administered 2016-10-10: 2 mg via INTRAVENOUS

## 2016-10-10 MED ORDER — DIPHENHYDRAMINE HCL 12.5 MG/5ML PO ELIX
12.5000 mg | ORAL_SOLUTION | Freq: Four times a day (QID) | ORAL | Status: DC | PRN
  Filled 2016-10-10: qty 5

## 2016-10-10 MED ORDER — SODIUM CHLORIDE 0.9 % IJ SOLN
INTRAMUSCULAR | Status: AC
  Filled 2016-10-10: qty 10

## 2016-10-10 MED ORDER — NALOXONE HCL 0.4 MG/ML IJ SOLN: 0.4000 mg | INTRAMUSCULAR | Status: DC | PRN

## 2016-10-10 MED ORDER — ACETAMINOPHEN 10 MG/ML IV SOLN
INTRAVENOUS | Status: DC | PRN
  Administered 2016-10-10: 1000 mg via INTRAVENOUS

## 2016-10-10 MED ORDER — SODIUM CHLORIDE 0.9% FLUSH: 9.0000 mL | INTRAVENOUS | Status: DC | PRN

## 2016-10-10 MED ORDER — LIDOCAINE HCL (CARDIAC) 20 MG/ML IV SOLN
INTRAVENOUS | Status: DC | PRN
  Administered 2016-10-10: 80 mg via INTRAVENOUS

## 2016-10-10 MED ORDER — FENTANYL CITRATE (PF) 100 MCG/2ML IJ SOLN
INTRAMUSCULAR | Status: AC
  Administered 2016-10-10: 50 ug via INTRAVENOUS
  Filled 2016-10-10: qty 2

## 2016-10-10 MED ORDER — ACETAMINOPHEN 10 MG/ML IV SOLN
INTRAVENOUS | Status: AC
  Filled 2016-10-10: qty 100

## 2016-10-10 MED ORDER — FENTANYL CITRATE (PF) 100 MCG/2ML IJ SOLN
25.0000 ug | INTRAMUSCULAR | Status: DC | PRN
  Administered 2016-10-10 (×2): 50 ug via INTRAVENOUS

## 2016-10-10 MED ORDER — LEVOTHYROXINE SODIUM 100 MCG PO TABS
100.0000 ug | ORAL_TABLET | Freq: Every day | ORAL | Status: DC
  Administered 2016-10-11 – 2016-10-12 (×2): 100 ug via ORAL
  Filled 2016-10-10 (×2): qty 1

## 2016-10-10 MED ORDER — OXYCODONE-ACETAMINOPHEN 5-325 MG PO TABS
1.0000 | ORAL_TABLET | ORAL | Status: DC | PRN
  Administered 2016-10-11 (×2): 1 via ORAL
  Administered 2016-10-11 – 2016-10-12 (×4): 2 via ORAL
  Filled 2016-10-10: qty 1
  Filled 2016-10-10: qty 2
  Filled 2016-10-10: qty 1
  Filled 2016-10-10 (×3): qty 2

## 2016-10-10 MED ORDER — ROCURONIUM BROMIDE 50 MG/5ML IV SOLN
INTRAVENOUS | Status: AC
  Filled 2016-10-10: qty 1

## 2016-10-10 MED ORDER — MIDAZOLAM HCL 2 MG/2ML IJ SOLN
INTRAMUSCULAR | Status: AC
  Filled 2016-10-10: qty 2

## 2016-10-10 MED ORDER — ROCURONIUM BROMIDE 50 MG/5ML IV SOLN
INTRAVENOUS | Status: AC
Start: 2016-10-10 — End: 2016-10-10
  Filled 2016-10-10: qty 1

## 2016-10-10 MED ORDER — SUGAMMADEX SODIUM 200 MG/2ML IV SOLN
INTRAVENOUS | Status: AC
Start: 2016-10-10 — End: 2016-10-10
  Filled 2016-10-10: qty 2

## 2016-10-10 MED ORDER — ONDANSETRON HCL 4 MG/2ML IJ SOLN: 4.0000 mg | Freq: Four times a day (QID) | INTRAMUSCULAR | Status: DC | PRN

## 2016-10-10 MED ORDER — HYDROMORPHONE HCL 1 MG/ML IJ SOLN
INTRAMUSCULAR | Status: AC
  Filled 2016-10-10: qty 1

## 2016-10-10 MED ORDER — HYDROMORPHONE HCL 1 MG/ML IJ SOLN
INTRAMUSCULAR | Status: DC | PRN
  Administered 2016-10-10: .4 mg via INTRAVENOUS
  Administered 2016-10-10: .2 mg via INTRAVENOUS
  Administered 2016-10-10: .4 mg via INTRAVENOUS

## 2016-10-10 MED ORDER — ONDANSETRON HCL 4 MG/2ML IJ SOLN
INTRAMUSCULAR | Status: AC
  Filled 2016-10-10: qty 2

## 2016-10-10 SURGICAL SUPPLY — 37 items
ADHESIVE MASTISOL STRL (MISCELLANEOUS) ×2 IMPLANT
BLADE SURG SZ10 CARB STEEL (BLADE) ×2 IMPLANT
CANISTER SUCT 1200ML W/VALVE (MISCELLANEOUS) ×2 IMPLANT
CATH TRAY 16F METER LATEX (MISCELLANEOUS) ×2 IMPLANT
CELL SAVER LIPIGURD (MISCELLANEOUS) IMPLANT
CHLORAPREP W/TINT 26ML (MISCELLANEOUS) ×2 IMPLANT
DRAPE LAPAROTOMY 100X77 ABD (DRAPES) IMPLANT
DRAPE LAPAROTOMY TRNSV 106X77 (MISCELLANEOUS) ×2 IMPLANT
DRSG TELFA 3X8 NADH (GAUZE/BANDAGES/DRESSINGS) ×2 IMPLANT
ELECT BLADE 6 FLAT ULTRCLN (ELECTRODE) ×2 IMPLANT
ELECT BLADE 6.5 EXT (BLADE) IMPLANT
ELECT CAUTERY BLADE 6.4 (BLADE) ×2 IMPLANT
ELECT REM PT RETURN 9FT ADLT (ELECTROSURGICAL) ×2
ELECTRODE REM PT RTRN 9FT ADLT (ELECTROSURGICAL) ×1 IMPLANT
EXTRT SYSTEM ALEXIS 14CM (MISCELLANEOUS)
GAUZE SPONGE 4X4 12PLY STRL (GAUZE/BANDAGES/DRESSINGS) ×2 IMPLANT
GLOVE INDICATOR 8.0 STRL GRN (GLOVE) ×12 IMPLANT
GLOVE ORTHO TXT STRL SZ7.5 (GLOVE) ×4 IMPLANT
GOWN STRL REUS W/ TWL LRG LVL3 (GOWN DISPOSABLE) ×2 IMPLANT
GOWN STRL REUS W/ TWL XL LVL3 (GOWN DISPOSABLE) ×1 IMPLANT
GOWN STRL REUS W/TWL LRG LVL3 (GOWN DISPOSABLE) ×2
GOWN STRL REUS W/TWL XL LVL3 (GOWN DISPOSABLE) ×1
KIT RM TURNOVER STRD PROC AR (KITS) ×2 IMPLANT
LABEL OR SOLS (LABEL) ×2 IMPLANT
LIGASURE IMPACT 36 18CM CVD LR (INSTRUMENTS) IMPLANT
NS IRRIG 1000ML POUR BTL (IV SOLUTION) ×2 IMPLANT
PACK BASIN MAJOR ARMC (MISCELLANEOUS) ×2 IMPLANT
PAD OB MATERNITY 4.3X12.25 (PERSONAL CARE ITEMS) ×2 IMPLANT
SPONGE LAP 18X18 5 PK (GAUZE/BANDAGES/DRESSINGS) ×4 IMPLANT
SPONGE XRAY 4X4 16PLY STRL (MISCELLANEOUS) ×2 IMPLANT
STAPLER SKIN PROX 35W (STAPLE) IMPLANT
STRIP CLOSURE SKIN 1/2X4 (GAUZE/BANDAGES/DRESSINGS) ×2 IMPLANT
SUT VIC AB 1 CT1 36 (SUTURE) ×6 IMPLANT
SUT VICRYL AB 3-0 FS1 BRD 27IN (SUTURE) ×2 IMPLANT
SUT VICRYL+ 3-0 36IN CT-1 (SUTURE) ×2 IMPLANT
SUTURE VIC 1-0 (SUTURE) ×2 IMPLANT
TOWEL OR 17X26 4PK STRL BLUE (TOWEL DISPOSABLE) ×2 IMPLANT

## 2016-10-10 NOTE — Anesthesia Procedure Notes (Signed)
Procedure Name: Intubation Date/Time: 10/10/2016 7:28 AM Performed by: Hedda Slade Pre-anesthesia Checklist: Patient identified, Patient being monitored, Timeout performed, Emergency Drugs available and Suction available Patient Re-evaluated:Patient Re-evaluated prior to inductionOxygen Delivery Method: Circle system utilized Preoxygenation: Pre-oxygenation with 100% oxygen Intubation Type: IV induction Ventilation: Mask ventilation without difficulty Laryngoscope Size: Mac and 3 Grade View: Grade I Tube type: Oral Tube size: 7.0 mm Number of attempts: 1 Airway Equipment and Method: Stylet Placement Confirmation: ETT inserted through vocal cords under direct vision,  positive ETCO2 and breath sounds checked- equal and bilateral Secured at: 21 cm Tube secured with: Tape Dental Injury: Teeth and Oropharynx as per pre-operative assessment

## 2016-10-10 NOTE — Anesthesia Preprocedure Evaluation (Signed)
Anesthesia Evaluation  Patient identified by MRN, date of birth, ID band Patient awake    Reviewed: Allergy & Precautions, H&P , NPO status , Patient's Chart, lab work & pertinent test results  History of Anesthesia Complications Negative for: history of anesthetic complications  Airway Mallampati: II  TM Distance: <3 FB Neck ROM: full    Dental  (+) Poor Dentition, Chipped   Pulmonary neg pulmonary ROS, neg shortness of breath,    Pulmonary exam normal breath sounds clear to auscultation       Cardiovascular Exercise Tolerance: Good (-) angina(-) Past MI and (-) DOE negative cardio ROS Normal cardiovascular exam Rhythm:regular Rate:Normal     Neuro/Psych negative neurological ROS  negative psych ROS   GI/Hepatic Neg liver ROS, GERD  Medicated and Controlled,  Endo/Other  Hypothyroidism   Renal/GU      Musculoskeletal   Abdominal   Peds  Hematology negative hematology ROS (+)   Anesthesia Other Findings Past Medical History: No date: GERD (gastroesophageal reflux disease) No date: Hypothyroid  Past Surgical History: 1990: CERVICAL CONE BIOPSY 2012: [place]: TUBAL LIGATION  BMI    Body Mass Index:  31.45 kg/m      Reproductive/Obstetrics negative OB ROS                             Anesthesia Physical Anesthesia Plan  ASA: III  Anesthesia Plan: General ETT   Post-op Pain Management:    Induction:   Airway Management Planned:   Additional Equipment:   Intra-op Plan:   Post-operative Plan:   Informed Consent: I have reviewed the patients History and Physical, chart, labs and discussed the procedure including the risks, benefits and alternatives for the proposed anesthesia with the patient or authorized representative who has indicated his/her understanding and acceptance.   Dental Advisory Given  Plan Discussed with: Anesthesiologist, CRNA and  Surgeon  Anesthesia Plan Comments:         Anesthesia Quick Evaluation

## 2016-10-10 NOTE — Anesthesia Postprocedure Evaluation (Signed)
Anesthesia Post Note  Patient: Bethany Bailey  Procedure(s) Performed: Procedure(s) (LRB): HYSTERECTOMY ABDOMINAL WITH BILATERAL SALPINGO OOPHERECTOMY (Bilateral)  Patient location during evaluation: PACU Anesthesia Type: General Level of consciousness: awake and alert Pain management: pain level controlled Vital Signs Assessment: post-procedure vital signs reviewed and stable Respiratory status: spontaneous breathing, nonlabored ventilation, respiratory function stable and patient connected to nasal cannula oxygen Cardiovascular status: blood pressure returned to baseline and stable Postop Assessment: no signs of nausea or vomiting Anesthetic complications: no     Last Vitals:  Vitals:   10/10/16 1110 10/10/16 1122  BP: (!) 145/74   Pulse: 81   Resp: 15 15  Temp: 37.1 C     Last Pain:  Vitals:   10/10/16 1122  TempSrc:   PainSc: 8                  Joseph K Piscitello

## 2016-10-10 NOTE — Interval H&P Note (Signed)
History and Physical Interval Note:  10/10/2016 7:15 AM  Bethany Bailey  has presented today for surgery, with the diagnosis of FIBROIDS, PELVIC PAIN  The various methods of treatment have been discussed with the patient and family. After consideration of risks, benefits and other options for treatment, the patient has consented to  Procedure(s): HYSTERECTOMY ABDOMINAL WITH BILATERAL SALPINGO OOPHERECTOMY (Bilateral) as a surgical intervention .  The patient's history has been reviewed, patient examined, no change in status, stable for surgery.  I have reviewed the patient's chart and labs.  Questions were answered to the patient's satisfaction.     Jeannie Fend

## 2016-10-10 NOTE — Anesthesia Post-op Follow-up Note (Cosign Needed)
Anesthesia QCDR form completed.        

## 2016-10-10 NOTE — Transfer of Care (Signed)
Immediate Anesthesia Transfer of Care Note  Patient: Bethany Bailey  Procedure(s) Performed: Procedure(s): HYSTERECTOMY ABDOMINAL WITH BILATERAL SALPINGO OOPHERECTOMY (Bilateral)  Patient Location: PACU  Anesthesia Type:General  Level of Consciousness: awake, alert  and oriented  Airway & Oxygen Therapy: Patient Spontanous Breathing and Patient connected to face mask oxygen  Post-op Assessment: Report given to RN and Post -op Vital signs reviewed and stable  Post vital signs: Reviewed and stable  Last Vitals:  Vitals:   10/10/16 0616 10/10/16 0952  BP: (!) 142/89 136/69  Pulse: 78 81  Resp: 18 19  Temp: 37 C 36.7 C    Last Pain:  Vitals:   10/10/16 0616  TempSrc: Oral         Complications: No apparent anesthesia complications

## 2016-10-10 NOTE — H&P (View-Only) (Signed)
PRE-OPERATIVE HISTORY AND PHYSICAL EXAM  PCP:  Mable Paris, FNP Subjective:   HPI:  Bethany Bailey is a 51 y.o. G0P0000.  Patient's last menstrual period was 09/23/2016 (exact date).  She presents today for a pre-op discussion and PE.  She has the following symptoms:  Pelvic pain, large uterine fibroids.  Review of Systems:   Constitutional: Denied constitutional symptoms, night sweats, recent illness, fatigue, fever, insomnia and weight loss.  Eyes: Denied eye symptoms, eye pain, photophobia, vision change and visual disturbance.  Ears/Nose/Throat/Neck: Denied ear, nose, throat or neck symptoms, hearing loss, nasal discharge, sinus congestion and sore throat.  Cardiovascular: Denied cardiovascular symptoms, arrhythmia, chest pain/pressure, edema, exercise intolerance, orthopnea and palpitations.  Respiratory: Denied pulmonary symptoms, asthma, pleuritic pain, productive sputum, cough, dyspnea and wheezing.  Gastrointestinal: Denied, gastro-esophageal reflux, melena, nausea and vomiting.  Genitourinary: See history of present illness and office notes  Musculoskeletal: Denied musculoskeletal symptoms, stiffness, swelling, muscle weakness and myalgia.  Dermatologic: Denied dermatology symptoms, rash and scar.  Neurologic: Denied neurology symptoms, dizziness, headache, neck pain and syncope.  Psychiatric: Denied psychiatric symptoms, anxiety and depression.  Endocrine: Denied endocrine symptoms including hot flashes and night sweats.   OB History  Gravida Para Term Preterm AB Living  0 0 0 0 0 0  SAB TAB Ectopic Multiple Live Births  0 0 0 0          Past Medical History:  Diagnosis Date  . Hypothyroid     Past Surgical History:  Procedure Laterality Date  . CERVICAL [place] BIOPSY  1990  . [place]  2012  . TUBAL LIGATION  1999      SOCIAL HISTORY: History  Smoking Status  . Never Smoker  Smokeless Tobacco  . Never Used   History  Alcohol Use  .  0.0 oz/week    Comment: Occasional    History  Drug Use No    Family History  Problem Relation Age of Onset  . Diabetes Mother   . Hypothyroidism Mother   . Diabetes Maternal Uncle   . Dementia Maternal Grandmother   . Stroke Maternal Grandmother   . Diabetes Maternal Grandfather   . Alzheimer's disease Paternal Grandmother   . Hypothyroidism Son     ALLERGIES:  Aspirin; Erythromycin; Food; and Sulfa antibiotics  MEDS:   Current Outpatient Prescriptions on File Prior to Visit  Medication Sig Dispense Refill  . cyclobenzaprine (FLEXERIL) 10 MG tablet Take 10 mg by mouth 3 (three) times daily as needed for muscle spasms.     Marland Kitchen levothyroxine (SYNTHROID, LEVOTHROID) 100 MCG tablet Take 100 mcg by mouth daily before breakfast.    . BLACK COHOSH PO Take 1 tablet by mouth 2 (two) times daily.    . Cholecalciferol (VITAMIN D-3) 5000 units TABS Take 5,000 Units by mouth daily with supper.    Marland Kitchen ibuprofen (ADVIL,MOTRIN) 200 MG tablet Take 400 mg by mouth every 8 (eight) hours as needed (for pain/headache.).     No current facility-administered medications on file prior to visit.     Meds ordered this encounter  Medications  . metroNIDAZOLE (FLAGYL) 500 MG tablet    Sig: Take 1 tablet (500 mg total) by mouth 2 (two) times daily.    Dispense:  14 tablet    Refill:  0     Physical examination BP 126/73   Pulse 98   Wt 189 lb 4 oz (85.8 kg)   LMP 09/23/2016 (Exact Date)  BMI 30.55 kg/m   General NAD, Conversant  HEENT Atraumatic; Op clear with mmm.  Normo-cephalic. Pupils reactive. Anicteric sclerae  Thyroid/Neck Smooth without nodularity or enlargement. Normal ROM.  Neck Supple.  Skin No rashes, lesions or ulceration. Normal palpated skin turgor. No nodularity.  Breasts: No masses or discharge.  Symmetric.  No axillary adenopathy.  Lungs: Clear to auscultation.No rales or wheezes. Normal Respiratory effort, no retractions.  Heart: NSR.  No murmurs or rubs appreciated. No  periferal edema  Abdomen: Soft.  Non-tender.  Large abdominal mass consistent with uterine fibroids  No HSM. No hernia  Extremities: Moves all appropriately.  Normal ROM for age. No lymphadenopathy.  Neuro: Oriented to PPT.  Normal mood. Normal affect.     Pelvic:   Vulva: Normal appearance.  No lesions.  Vagina: No lesions or abnormalities noted. Copious vaginal discharge   Support: Normal pelvic support.  Urethra No masses tenderness or scarring.  Meatus Normal size without lesions or prolapse.  Cervix: Normal ectropion.  No lesions.  Anus: Normal exam.  No lesions.  Perineum: Normal exam.  No lesions.        Bimanual   Uterus: 19 weeks size.  Non-tender.  Mobile.  AV.  Adnexae: No masses.  Non-tender to palpation.  Cul-de-sac: Negative for abnormality.   Assessment:   G0P0000 Patient Active Problem List   Diagnosis Date Noted  . Hypothyroidism 02/07/2016  . Well woman exam with routine gynecological exam 02/07/2016  . High risk heterosexual behavior 01/23/2015    1. Preop examination   2. Fibroids, intramural   3. Pelvic pain in female   4. Bacterial vulvovaginitis      Plan:   1.  total abdominal hysterectomy, bilateral oophorectomy and bilateral salpingectomy    Finis Bud, M.D. 10/01/2016 10:27 AM

## 2016-10-10 NOTE — Op Note (Signed)
@  LOGO@    OPERATIVE NOTE 10/10/2016 9:52 AM  PRE-OPERATIVE DIAGNOSIS:  1) FIBROIDS, PELVIC PAIN  POST-OPERATIVE DIAGNOSIS:  1) Same with pelvic adhesions  OPERATION: Total Abdominal Hysterectomy with Bilateral Salpingectomy  SURGEON(S): Surgeon(s) and Role:    * Harlin Heys, MD - Primary   ANESTHESIA: General  ESTIMATED BLOOD LOSS: 276ml   OPERATIVE FINDINGS:  Very large irregular uterus with fibroids and posterior adhesions.  SPECIMEN:  ID Type Source Tests Collected by Time Destination  1 : uterus with cervix Tissue [facility] benign resection SURGICAL PATHOLOGY Harlin Heys, MD 12/06/3760 [id number]     COMPLICATIONS: None  DRAINS: Foley to gravity  DISPOSITION: Stable to recovery room  DESCRIPTION OF PROCEDURE: The patient was prepped and draped in the supine position and placed under spinal anesthesia.  A transverse incision was made across the abdomen in a Pfannenstiel manner.    We carried the dissection down to the level of the fascia.  The fascia was incised in a curvilinear manner.  The fascia was then elevated from the rectus muscles with blunt and sharp dissection.  The rectus muscles were separated laterally exposing the peritoneum.  The peritoneum was carefully entered with care being taken to avoid bowel and bladder.  Adhesions were encountered to the posterior uterus and adnexa.  These were systematically lysed.  Multiple large uterine fibroids were encountered. The uterus was grasped and elevated outside the abdomen.  The left and right round ligaments were identified tied and fulgurated.  The anterior portion of the broad ligament was then incised and the bladder was taken down along the cervix mobilizing the bladder from the cervix.  A window was made in the broad ligament and the infundibulopelvic ligaments were carefully identified. The ligaments were clamped divided and doubly suture ligated. Hemostasis of the pedicles was noted.  The uterine arteries were  skeletonized. The uterine arteries were then clamped the pedicle was divided and suture ligatures were placed. The uterine arteries were double tied with the next pedicle. To create more surgical room to work, the fundus of the uterus was carefully cut free of the remaining cervix at the level of the internal cervical os.  The cervical stump was grasped.  We proceeded down the lateral aspects of the cervix clamping dividing and suture ligating all pedicles to the level of the external cervical os. The uterosacral ligaments were clamped divided and suture ligated. A stab incision was made into the vagina and the cervix was cut free of the vaginal mucosa in a circumferential manner using the angled Jergensen scissors. The vaginal cuff was identified on Coker clamps. Richardson angle sutures were placed in the usual manner and the vaginal cuff was closed with interrupted sutures of Vicryl. Hemostasis was noted. The remainder of the pedicles were carefully inspected and found to be hemostatic. The self-retaining retaining retractor was removed and the sponges were removed from the abdomen and pelvis. Hemostasis was again noted. The fascia was then closed with a running suture of #1 Vicryl.  Hemostasis of the subcutaneous tissues was obtained using the Bovie.  The subcutaneous tissues were closed with a running suture of 000 Vicryl.  A subcuticular suture was placed.  Steri-Strips were applied in the usual manner.  A pressure dressing was placed.  The patient tolerated the procedure well.  All counts were correct.  The patient was taken to the recovery room in stable condition.  Finis Bud, M.D. 10/10/2016 9:52 AM

## 2016-10-11 LAB — CBC
HCT: 33 % — ABNORMAL LOW (ref 35.0–47.0)
Hemoglobin: 11.1 g/dL — ABNORMAL LOW (ref 12.0–16.0)
MCH: 29.8 pg (ref 26.0–34.0)
MCHC: 33.6 g/dL (ref 32.0–36.0)
MCV: 88.8 fL (ref 80.0–100.0)
Platelets: 212 10*3/uL (ref 150–440)
RBC: 3.71 MIL/uL — AB (ref 3.80–5.20)
RDW: 13.4 % (ref 11.5–14.5)
WBC: 14.2 10*3/uL — AB (ref 3.6–11.0)

## 2016-10-11 MED ORDER — MAGNESIUM HYDROXIDE 400 MG/5ML PO SUSP
30.0000 mL | Freq: Two times a day (BID) | ORAL | Status: DC | PRN
  Administered 2016-10-11 – 2016-10-12 (×2): 30 mL via ORAL
  Filled 2016-10-11 (×2): qty 30

## 2016-10-11 NOTE — Progress Notes (Addendum)
Post-Operative Day #1 s/p TAH with BSO  Subjective: up ad lib, voiding, tolerating PO and + flatus.  Patient still noting bloating despite passage of flatus (has only passed once or twice).   Objective: Temp:  [98.1 F (36.7 C)-99.2 F (37.3 C)] 98.1 F (36.7 C) (04/07 0729) Pulse Rate:  [70-91] 79 (04/07 0729) Resp:  [14-20] 14 (04/07 1116) BP: (121-147)/(62-74) 129/70 (04/07 0729) SpO2:  [96 %-100 %] 100 % (04/07 1116)  Physical Exam:  General: alert and no distress  Lungs: clear to auscultation bilaterally Heart: regular rate and rhythm, S1, S2 normal, no murmur, click, rub or gallop  Abdomen: soft, non-tender, no masses palpable. Bowel sounds present. Incision site bandage c/d/I. Pelvis: : scant vaginal bleeding.  Extremities: DVT Evaluation: No evidence of DVT seen on physical exam. Negative Homan's sign. No cords or calf tenderness. No significant calf/ankle edema.   Recent Labs:  CBC Latest Ref Rng & Units 10/11/2016 10/06/2016  WBC 3.6 - 11.0 K/uL 14.2(H) 7.1  Hemoglobin 12.0 - 16.0 g/dL 11.1(L) 13.5  Hematocrit 35.0 - 47.0 % 33.0(L) 40.0  Platelets 150 - 440 K/uL 212 238     Lab Results  Component Value Date   CREATININE 0.86 02/07/2016     Assessment/Plan: Doing well post-operatively s/p TAH with BSO. Bloating, partially relieved with simethicone, will treat with milk of magnesia.  Continue to encourage ambulation Advance diet as tolerated.  D/c IVF once tolerating regular diet. Continue home meds (synthroid for thyroid disease) Dispo: d/c home in 1-2 days.     Rubie Maid  [facility]

## 2016-10-12 DIAGNOSIS — Z9079 Acquired absence of other genital organ(s): Secondary | ICD-10-CM

## 2016-10-12 DIAGNOSIS — Z90722 Acquired absence of ovaries, bilateral: Secondary | ICD-10-CM

## 2016-10-12 DIAGNOSIS — Z9889 Other specified postprocedural states: Secondary | ICD-10-CM

## 2016-10-12 DIAGNOSIS — Z90710 Acquired absence of both cervix and uterus: Secondary | ICD-10-CM

## 2016-10-12 MED ORDER — SIMETHICONE 80 MG PO CHEW: 80.0000 mg | CHEWABLE_TABLET | Freq: Four times a day (QID) | ORAL | 0 refills | Status: DC | PRN

## 2016-10-12 MED ORDER — OXYCODONE-ACETAMINOPHEN 5-325 MG PO TABS: 1.0000 | ORAL_TABLET | Freq: Four times a day (QID) | ORAL | 0 refills | Status: DC | PRN

## 2016-10-12 MED ORDER — DOCUSATE SODIUM 100 MG PO CAPS: 100.0000 mg | ORAL_CAPSULE | Freq: Two times a day (BID) | ORAL | 2 refills | Status: DC | PRN

## 2016-10-12 MED ORDER — IBUPROFEN 200 MG PO TABS: 600.0000 mg | ORAL_TABLET | Freq: Four times a day (QID) | ORAL | 0 refills | Status: DC | PRN

## 2016-10-12 MED ORDER — LIDOCAINE 5 % EX PTCH
1.0000 | MEDICATED_PATCH | CUTANEOUS | Status: DC
  Administered 2016-10-12: 1 via TRANSDERMAL
  Filled 2016-10-12: qty 1

## 2016-10-12 NOTE — Progress Notes (Signed)
Post-Operative Day #2 s/p TAH with BSO  Subjective: up ad lib, voiding, tolerating PO and + flatus.  Patient still noting midchest gas paindespite passage of flatus.  Has not had BM.    Objective: Temp:  [98.1 F (36.7 C)-99.2 F (37.3 C)] 98.1 F (36.7 C) (04/08 0740) Pulse Rate:  [79-95] 79 (04/08 0740) Resp:  [14-20] 18 (04/08 0740) BP: (113-137)/(53-77) 113/59 (04/08 0740) SpO2:  [95 %-100 %] 99 % (04/08 0740)  Physical Exam:  General: alert and no distress  Lungs: clear to auscultation bilaterally Heart: regular rate and rhythm, S1, S2 normal, no murmur, click, rub or gallop  Abdomen: soft, no masses palpable. Bowel sounds present. Incision site c/d/I, some swelling noted near incision site, appropriately tender. Pelvis: : no vaginal bleeding.  Extremities: DVT Evaluation: No evidence of DVT seen on physical exam. Negative Homan's sign. No cords or calf tenderness. No significant calf/ankle edema.   Recent Labs:  CBC Latest Ref Rng & Units 10/11/2016 10/06/2016  WBC 3.6 - 11.0 K/uL 14.2(H) 7.1  Hemoglobin 12.0 - 16.0 g/dL 11.1(L) 13.5  Hematocrit 35.0 - 47.0 % 33.0(L) 40.0  Platelets 150 - 440 K/uL 212 238     Lab Results  Component Value Date   CREATININE 0.86 02/07/2016     Assessment/Plan: Doing well post-operatively s/p TAH with BSO. Bloating, partially relieved with simethicone, will continue to treat with milk of magnesia, can also give Fleet's enema.  Continue to encourage ambulation Regular diet as tolerated.  Continue home meds (synthroid for thyroid disease) Will prescribe lidoderm patch for incision site.   Dispo: d/c home either later today or in a.m.    Rubie Maid, MD [facility]

## 2016-10-12 NOTE — Discharge Summary (Signed)
Gynecology Physician Postoperative Discharge Summary  Patient ID: Bethany Bailey MRN: [id number] DOB/AGE: 01-04-1966 51 y.o.  Admit Date: 10/10/2016 Discharge Date: 10/12/2016  Preoperative Diagnoses: FIBROIDS, PELVIC PAIN  Procedures: Procedure(s) (LRB): HYSTERECTOMY ABDOMINAL WITH BILATERAL SALPINGO OOPHERECTOMY (Bilateral)  Hospital Course:  Bethany Bailey is a 51 y.o. G0P0  admitted for scheduled surgery.  She underwent the procedures as mentioned above, her operation was uncomplicated. For further details about surgery, please refer to the operative report. Patient had an uncomplicated postoperative course. By time of discharge on POD#2, her pain was controlled on oral pain medications; she was ambulating, voiding without difficulty, tolerating regular diet and passing flatus. She was deemed stable for discharge to home.   Significant Labs: CBC Latest Ref Rng & Units 10/11/2016 10/06/2016 02/07/2016  WBC 3.6 - 11.0 K/uL 14.2(H) 7.1 8.1  Hemoglobin 12.0 - 16.0 g/dL 11.1(L) 13.5 12.4  Hematocrit 35.0 - 47.0 % 33.0(L) 40.0 36.7  Platelets 150 - 440 K/uL 212 238 259.0    Discharge Exam: Blood pressure (!) 113/59, pulse 79, temperature 98.1 F (36.7 C), temperature source Oral, resp. rate 18, height 5\' 5"  (1.651 m), weight 189 lb (85.7 kg), last menstrual period 09/23/2016, SpO2 99 %. General appearance: alert and no distress  Resp: clear to auscultation bilaterally  Cardio: regular rate and rhythm  GI: soft, non-tender; bowel sounds normal; no masses, no organomegaly.  Incision: C/D/I, no erythema, no drainage noted Pelvic: scant blood on pad  Extremities: extremities normal, atraumatic, no cyanosis or edema and Homans sign is negative, no sign of DVT  Discharged Condition: Stable  Disposition: Final discharge disposition not confirmed   Allergies as of 10/12/2016      Reactions   Aspirin Other (See Comments)   Ears ring   Erythromycin Nausea And Vomiting   Food    Wheat-coughing/sneezing & joint pain.   Sulfa Antibiotics Rash      Medication List    TAKE these medications   BLACK COHOSH PO Take 1 tablet by mouth 2 (two) times daily.   cyclobenzaprine 10 MG tablet Commonly known as:  FLEXERIL Take 10 mg by mouth 3 (three) times daily as needed for muscle spasms.   docusate sodium 100 MG capsule Commonly known as:  COLACE Take 1 capsule (100 mg total) by mouth 2 (two) times daily as needed.   ibuprofen 200 MG tablet Commonly known as:  ADVIL,MOTRIN Take 3 tablets (600 mg total) by mouth every 6 (six) hours as needed for mild pain (for pain/headache.). What changed:  how much to take  when to take this  reasons to take this   levothyroxine 100 MCG tablet Commonly known as:  SYNTHROID, LEVOTHROID Take 100 mcg by mouth daily before breakfast.   oxyCODONE-acetaminophen 5-325 MG tablet Commonly known as:  PERCOCET/ROXICET Take 1-2 tablets by mouth every 6 (six) hours as needed (moderate to severe pain (when tolerating fluids)).   ranitidine 150 MG tablet Commonly known as:  ZANTAC Take 150 mg by mouth 2 (two) times daily.   simethicone 80 MG chewable tablet Commonly known as:  MYLICON Chew 1 tablet (80 mg total) by mouth 4 (four) times daily as needed for flatulence.   Vitamin D-3 5000 units Tabs Take 5,000 Units by mouth daily with supper.      Follow-up Information    Jeannie Fend, MD. Schedule an appointment as soon as possible for a visit in 1 week(s).   Specialty:  Obstetrics and Gynecology Why:  Incision check Contact information: [address] [place] [address] [phone]           Signed:  Rubie Maid, MD [facility]

## 2016-10-12 NOTE — Progress Notes (Signed)
D/C instructions provided, pt states understanding, aware of follow up appt. Prescriptions given to pt.

## 2016-10-12 NOTE — Progress Notes (Signed)
D/C home to car via auxiliary in wheelchair.  

## 2016-10-12 NOTE — Discharge Instructions (Signed)
Abdominal Hysterectomy, Care After °This sheet gives you information about how to care for yourself after your procedure. Your health care provider may also give you more specific instructions. If you have problems or questions, contact your health care provider. °What can I expect after the procedure? °After your procedure, it is common to have: °· Pain. °· Fatigue. °· Poor appetite. °· Less interest in sex. °· Vaginal bleeding and discharge. You may need to use a sanitary napkin after this procedure. ° °Follow these instructions at home: °Bathing °· Do not take baths, swim, or use a hot tub until your health care provider approves. Ask your health care provider if you can take showers. You may only be allowed to take sponge baths for bathing. °· Keep the bandage (dressing) dry until your health care provider says it can be removed. °Incision care °· Follow instructions from your health care provider about how to take care of your incision. Make sure you: °? Wash your hands with soap and water before you change your bandage (dressing). If soap and water are not available, use hand sanitizer. °? Change your dressing as told by your health care provider. °? Leave stitches (sutures), skin glue, or adhesive strips in place. These skin closures may need to stay in place for 2 weeks or longer. If adhesive strip edges start to loosen and curl up, you may trim the loose edges. Do not remove adhesive strips completely unless your health care provider tells you to do that. °· Check your incision area every day for signs of infection. Check for: °? Redness, swelling, or pain. °? Fluid or blood. °? Warmth. °? Pus or a bad smell. °Activity °· Do gentle, daily exercises as told by your health care provider. You may be told to take short walks every day and go farther each time. °· Do not lift anything that is heavier than 10 lb (4.5 kg), or the limit that your health care provider tells you, until he or she says that it is  safe. °· Do not drive or use heavy machinery while taking prescription pain medicine. °· Do not drive for 24 hours if you were given a medicine to help you relax (sedative). °· Follow your health care provider's instructions about exercise, driving, and general activities. Ask your health care provider what activities are safe for you. °Lifestyle °· Do not douche, use tampons, or have sex for at least 6 weeks or as told by your health care provider. °· Do not drink alcohol until your health care provider approves. °· Drink enough fluid to keep your urine clear or pale yellow. °· Try to have someone at home with you for the first 1-2 weeks to help. °· Do not use any products that contain nicotine or tobacco, such as cigarettes and e-cigarettes. These can delay healing. If you need help quitting, ask your health care provider. °General instructions °· Take over-the-counter and prescription medicines only as told by your health care provider. °· Do not take aspirin or ibuprofen. These medicines can cause bleeding. °· To prevent or treat constipation while you are taking prescription pain medicine, your health care provider may recommend that you: °? Drink enough fluid to keep your urine clear or pale yellow. °? Take over-the-counter or prescription medicines. °? Eat foods that are high in fiber, such as fresh fruits and vegetables, whole grains, and beans. °? Limit foods that are high in fat and processed sugars, such as fried and sweet foods. °· Keep all   follow-up visits as told by your health care provider. This is important. °Contact a health care provider if: °· You have chills or fever. °· You have redness, swelling, or pain around your incision. °· You have fluid or blood coming from your incision. °· Your incision feels warm to the touch. °· You have pus or a bad smell coming from your incision. °· Your incision breaks open. °· You feel dizzy or light-headed. °· You have pain or bleeding when you urinate. °· You  have persistent diarrhea. °· You have persistent nausea and vomiting. °· You have abnormal vaginal discharge. °· You have a rash. °· You have any type of abnormal reaction or you develop an allergy to your medicine. °· Your pain medicine does not help. °Get help right away if: °· You have a fever and your symptoms suddenly get worse. °· You have severe abdominal pain. °· You have shortness of breath. °· You faint. °· You have pain, swelling, or redness in your leg. °· You have heavy vaginal bleeding with blood clots. °Summary °· After your procedure, it is common to have pain, fatigue and vaginal discharge. °· Do not take baths, swim, or use a hot tub until your health care provider approves. Ask your health care provider if you can take showers. You may only be allowed to take sponge baths for bathing. °· Follow your health care provider's instructions about exercise, driving, and general activities. Ask your health care provider what activities are safe for you. °· Do not lift anything that is heavier than 10 lb (4.5 kg), or the limit that your health care provider tells you, until he or she says that it is safe. °· Try to have someone at home with you for the first 1-2 weeks to help. °This information is not intended to replace advice given to you by your health care provider. Make sure you discuss any questions you have with your health care provider. °Document Released: 01/10/2005 Document Revised: 06/11/2016 Document Reviewed: 06/11/2016 °[employer] Interactive Patient Education © 2017 [employer] ° °

## 2016-10-14 LAB — SURGICAL PATHOLOGY

## 2016-10-17 ENCOUNTER — Ambulatory Visit (INDEPENDENT_AMBULATORY_CARE_PROVIDER_SITE_OTHER): Payer: BLUE CROSS/BLUE SHIELD | Admitting: Obstetrics and Gynecology

## 2016-10-17 ENCOUNTER — Encounter: Payer: Self-pay | Admitting: Obstetrics and Gynecology

## 2016-10-17 VITALS — BP 148/81 | HR 94 | Ht 65.0 in | Wt 182.3 lb

## 2016-10-17 DIAGNOSIS — D251 Intramural leiomyoma of uterus: Secondary | ICD-10-CM

## 2016-10-17 DIAGNOSIS — N951 Menopausal and female climacteric states: Secondary | ICD-10-CM

## 2016-10-17 DIAGNOSIS — Z9889 Other specified postprocedural states: Secondary | ICD-10-CM

## 2016-10-17 DIAGNOSIS — Z79890 Hormone replacement therapy (postmenopausal): Secondary | ICD-10-CM

## 2016-10-17 MED ORDER — ESTRADIOL 0.05 MG/24HR TD PTWK: 0.0500 mg | MEDICATED_PATCH | TRANSDERMAL | 2 refills | Status: DC

## 2016-10-17 NOTE — Progress Notes (Signed)
HPI:      [name] is a 51 y.o. G0P0000 who LMP was Patient's last menstrual period was 09/23/2016 (exact date).  Subjective:   She presents today 1 week from TAH/BSO. She reports that she is doing well without problem. She is not taking pain medication. She has started to have hot flashes and she says they are occurring regularly and every day.    Hx: The following portions of the patient's history were reviewed and updated as appropriate:              She  has a past medical history of GERD (gastroesophageal reflux disease) and Hypothyroid. She  has a past surgical history that includes Tubal ligation (1999); Novasure ablation (2012); Cervical cone biopsy (1990); and Abdominal hysterectomy (Bilateral, 10/10/2016). Current Outpatient Prescriptions on File Prior to Visit  Medication Sig Dispense Refill  . BLACK COHOSH PO Take 1 tablet by mouth 2 (two) times daily.    . Cholecalciferol (VITAMIN D-3) 5000 units TABS Take 5,000 Units by mouth daily with supper.    Marland Kitchen ibuprofen (ADVIL,MOTRIN) 200 MG tablet Take 3 tablets (600 mg total) by mouth every 6 (six) hours as needed for mild pain (for pain/headache.). 30 tablet 0  . levothyroxine (SYNTHROID, LEVOTHROID) 100 MCG tablet Take 100 mcg by mouth daily before breakfast.    . cyclobenzaprine (FLEXERIL) 10 MG tablet Take 10 mg by mouth 3 (three) times daily as needed for muscle spasms.     Marland Kitchen oxyCODONE-acetaminophen (PERCOCET/ROXICET) 5-325 MG tablet Take 1-2 tablets by mouth every 6 (six) hours as needed (moderate to severe pain (when tolerating fluids)). (Patient not taking: Reported on 10/17/2016) 30 tablet 0  . ranitidine (ZANTAC) 150 MG tablet Take 150 mg by mouth 2 (two) times daily.     No current facility-administered medications on file prior to visit.          Review of Systems:  Review of Systems  Constitutional: Denied constitutional symptoms, night sweats, recent illness, fatigue, fever, insomnia and weight loss.  Eyes:  Denied eye symptoms, eye pain, photophobia, vision change and visual disturbance.  Ears/Nose/Throat/Neck: Denied ear, nose, throat or neck symptoms, hearing loss, nasal discharge, sinus congestion and sore throat.  Cardiovascular: Denied cardiovascular symptoms, arrhythmia, chest pain/pressure, edema, exercise intolerance, orthopnea and palpitations.  Respiratory: Denied pulmonary symptoms, asthma, pleuritic pain, productive sputum, cough, dyspnea and wheezing.  Gastrointestinal: Denied, gastro-esophageal reflux, melena, nausea and vomiting.  Genitourinary: Denied genitourinary symptoms including symptomatic vaginal discharge, pelvic relaxation issues, and urinary complaints.  Musculoskeletal: Denied musculoskeletal symptoms, stiffness, swelling, muscle weakness and myalgia.  Dermatologic: Denied dermatology symptoms, rash and scar.  Neurologic: Denied neurology symptoms, dizziness, headache, neck pain and syncope.  Psychiatric: Denied psychiatric symptoms, anxiety and depression.  Endocrine: Daily and nightly hot flashes   Meds:   Current Outpatient Prescriptions on File Prior to Visit  Medication Sig Dispense Refill  . BLACK COHOSH PO Take 1 tablet by mouth 2 (two) times daily.    . Cholecalciferol (VITAMIN D-3) 5000 units TABS Take 5,000 Units by mouth daily with supper.    Marland Kitchen ibuprofen (ADVIL,MOTRIN) 200 MG tablet Take 3 tablets (600 mg total) by mouth every 6 (six) hours as needed for mild pain (for pain/headache.). 30 tablet 0  . levothyroxine (SYNTHROID, LEVOTHROID) 100 MCG tablet Take 100 mcg by mouth daily before breakfast.    . cyclobenzaprine (FLEXERIL) 10 MG tablet Take 10 mg by mouth 3 (three) times daily as needed for muscle spasms.     Marland Kitchen  oxyCODONE-acetaminophen (PERCOCET/ROXICET) 5-325 MG tablet Take 1-2 tablets by mouth every 6 (six) hours as needed (moderate to severe pain (when tolerating fluids)). (Patient not taking: Reported on 10/17/2016) 30 tablet 0  . ranitidine (ZANTAC)  150 MG tablet Take 150 mg by mouth 2 (two) times daily.     No current facility-administered medications on file prior to visit.     Objective:     Vitals:   10/17/16 0901  BP: (!) 148/81  Pulse: 94               Abdomen: Soft.  Non-tender.  No masses.  No HSM.  Incision/s: Intact.  Healing well.  No erythema.  No drainage.      Assessment:    G0P0000 Patient Active Problem List   Diagnosis Date Noted  . Post-operative state 10/10/2016  . Hypothyroidism 02/07/2016  . Well woman exam with routine gynecological exam 02/07/2016  . High risk heterosexual behavior 01/23/2015     1. Post-operative state   2. Fibroids, intramural   3. Symptomatic menopausal or female climacteric states   4. Postmenopausal hormone therapy     Patient doing very well postoperatively. Surgical menopause and now having symptoms.   Plan:            1.  HRT I have discussed HRT with the patient in detail.  The risk/benefits of it were reviewed.  She understands that during menopause Estrogen decreases dramatically and that this results in an increased risk of cardiovascular disease as well as osteoporosis.  We have also discussed the fact that hot flashes often result from a decrease in Estrogen, and that by replacing Estrogen, they can often be alleviated.  We have discussed skin, vaginal and urinary tract changes that may also take place from this drop in Estrogen.  Emotional changes have also been linked to Estrogen and we have briefly discussed this.  The benefits of HRT including decrease in hot flashes, vaginal dryness, and osteoporosis were discussed.  The emotional benefit and a possible change in her cardiovascular risk profile was also reviewed.  The risks associated with Hormone Replacement Therapy were also reviewed.  The use of unopposed Estrogen and its relationship to endometrial cancer was discussed.  The addition of Progesterone and its beneficial effect on endometrial cancer was also  noted.  The fact that there has been no consistent definitive studies showing an increase in breast cancer in women who use HRT was discussed with the patient.  The possible side effects including breast tenderness, fluid retention, mood changes and vaginal bleeding were discussed.  The patient was informed that this is an elective medication and that she may choose not to take Hormone Replacement Therapy.  Literature on HRT was given, and I believe that after answering all of the patient's questions, she has an adequate and informed understanding of HRT.  Special emphasis on the WHI study, as well as several studies since that pertaining to the risks and benefits of estrogen replacement therapy were compared.  The possible limitations of these studies were discussed including the age stratification of the WHI study.  The possible role of Progesterone in these studies was discussed in detail.  I believe that the patient has an informed knowledge of the risks and benefits of HRT.  I have specifically discussed WHI findings and current updates.  Different type of hormone formulation and methods of taking hormone replacement therapy discussed.  She has elected to begin Cl[place]ara patch. 2.  Patient may slowly  resume normal activities with the exception of heavy lifting or vaginal intercourse. Orders No orders of the defined types were placed in this encounter.    Meds ordered this encounter  Medications  . estradiol (CLIMARA - DOSED IN MG/24 HR) 0.05 mg/24hr patch    Sig: Place 1 patch (0.05 mg total) onto the skin once a week.    Dispense:  4 patch    Refill:  2        F/U  Return in about 5 weeks (around 11/21/2016).  Finis Bud, M.D. 10/17/2016 10:10 AM

## 2016-10-22 ENCOUNTER — Encounter: Payer: Self-pay | Admitting: Family

## 2016-10-22 ENCOUNTER — Ambulatory Visit (INDEPENDENT_AMBULATORY_CARE_PROVIDER_SITE_OTHER): Payer: BLUE CROSS/BLUE SHIELD

## 2016-10-22 ENCOUNTER — Ambulatory Visit (INDEPENDENT_AMBULATORY_CARE_PROVIDER_SITE_OTHER): Payer: BLUE CROSS/BLUE SHIELD | Admitting: Family

## 2016-10-22 VITALS — BP 128/84 | HR 88 | Temp 98.2°F | Ht 65.0 in | Wt 181.6 lb

## 2016-10-22 DIAGNOSIS — M79602 Pain in left arm: Secondary | ICD-10-CM | POA: Diagnosis not present

## 2016-10-22 MED ORDER — MELOXICAM 7.5 MG PO TABS: 7.5000 mg | ORAL_TABLET | Freq: Every day | ORAL | 1 refills | Status: DC

## 2016-10-22 NOTE — Progress Notes (Signed)
Pre visit review using our clinic review tool, if applicable. No additional management support is needed unless otherwise documented below in the visit note. 

## 2016-10-22 NOTE — Progress Notes (Signed)
Subjective:    Patient ID: Bethany Bailey, female    DOB: October 05, 1965, 51 y.o.   MRN: [id number]  CC: Bethany Bailey is a 51 y.o. female who presents today for an acute visit.    HPI: Upper Left arm pain x 5 months, worsening.  thinks injured it when 'pulling cabinent' Pain with moving arm to back or putting on coat, pulling on lawn mower. Intermittent numbness on deltoid. No rash, N, V, F, CP, SOB. Hasn't tried any medication. No pain when doesn't move it   Works in [employer]    HISTORY:  Past Medical History:  Diagnosis Date  . GERD (gastroesophageal reflux disease)   . Hypothyroid    Past Surgical History:  Procedure Laterality Date  . ABDOMINAL HYSTERECTOMY Bilateral 10/10/2016   Procedure: HYSTERECTOMY ABDOMINAL WITH BILATERAL SALPINGO OOPHERECTOMY;  Surgeon: Harlin Heys, MD;  Location: [facility] ORS;  Service: Gynecology;  Laterality: Bilateral;  . CERVICAL CONE BIOPSY  1990  . [place]  2012  . TUBAL LIGATION  1999   Family History  Problem Relation Age of Onset  . Diabetes Mother   . Hypothyroidism Mother   . Diabetes Maternal Uncle   . Dementia Maternal Grandmother   . Stroke Maternal Grandmother   . Diabetes Maternal Grandfather   . Alzheimer's disease Paternal Grandmother   . Hypothyroidism Son     Allergies: Aspirin; Erythromycin; Food; and Sulfa antibiotics Current Outpatient Prescriptions on File Prior to Visit  Medication Sig Dispense Refill  . BLACK COHOSH PO Take 1 tablet by mouth 2 (two) times daily.    . Cholecalciferol (VITAMIN D-3) 5000 units TABS Take 5,000 Units by mouth daily with supper.    . cyclobenzaprine (FLEXERIL) 10 MG tablet Take 10 mg by mouth 3 (three) times daily as needed for muscle spasms.     Marland Kitchen estradiol (CLIMARA - DOSED IN MG/24 HR) 0.05 mg/24hr patch Place 1 patch (0.05 mg total) onto the skin once a week. 4 patch 2  . ibuprofen (ADVIL,MOTRIN) 200 MG tablet Take 3 tablets (600 mg total) by mouth every 6 (six) hours as  needed for mild pain (for pain/headache.). 30 tablet 0  . levothyroxine (SYNTHROID, LEVOTHROID) 100 MCG tablet Take 100 mcg by mouth daily before breakfast.    . oxyCODONE-acetaminophen (PERCOCET/ROXICET) 5-325 MG tablet Take 1-2 tablets by mouth every 6 (six) hours as needed (moderate to severe pain (when tolerating fluids)). 30 tablet 0  . ranitidine (ZANTAC) 150 MG tablet Take 150 mg by mouth 2 (two) times daily.     No current facility-administered medications on file prior to visit.     Social History  Substance Use Topics  . Smoking status: Never Smoker  . Smokeless tobacco: Never Used  . Alcohol use 0.0 oz/week     Comment: Occasional     Review of Systems  Constitutional: Negative for chills and fever.  Respiratory: Negative for cough.   Cardiovascular: Negative for chest pain and palpitations.  Gastrointestinal: Negative for nausea and vomiting.  Musculoskeletal: Negative for back pain and neck pain.      Objective:    BP 128/84   Pulse 88   Temp 98.2 F (36.8 C) (Oral)   Ht 5\' 5"  (1.651 m)   Wt 181 lb 9.6 oz (82.4 kg)   LMP 09/23/2016 (Exact Date)   SpO2 98%   BMI 30.22 kg/m    Physical Exam  Constitutional: She appears well-developed and well-nourished.  Eyes: Conjunctivae are normal.  Cardiovascular: Normal  rate, regular rhythm, normal heart sounds and normal pulses.   Pulmonary/Chest: Effort normal and breath sounds normal. She has no wheezes. She has no rhonchi. She has no rales.  Musculoskeletal:       Left shoulder: She exhibits normal range of motion, no tenderness, no bony tenderness and no spasm.       Arms: Left Shoulder:   No asymmetry of shoulders when comparing right and left.No pain with palpation over glenohumeral joint lines, [place] joint, AC joint, or bicipital groove. No pain with internal and external rotation. No pain with resisted lateral extension .   Negative active painful arc sign. Negative passive arc ( Neer's). Negative drop arm. No  pain, swelling, or ecchymosis noted over long head of biceps.  Mild  Tenderness noted over lateral aspect of deltoid with palpation as noted on diagram. No swelling, ecchymosis.   Strength and sensation normal BUE's.   Neurological: She is alert.  Skin: Skin is warm and dry.  Psychiatric: She has a normal mood and affect. Her speech is normal and behavior is normal. Thought content normal.  Vitals reviewed.      Assessment & Plan:  1. Left arm pain Working diagnosis of muscle strain from pulling cabinet.  Pending x-rays to ensure no acute fracture. No gross abnormalities on exam. Patient and I jointly agreed on conservative therapy at this time and if no resolve we will consult orthopedics.  - meloxicam (MOBIC) 7.5 MG tablet; Take 1 tablet (7.5 mg total) by mouth daily.  Dispense: 30 tablet; Refill: 1 - DG Shoulder Left - DG Humerus Left     I am having Bethany Bailey maintain her levothyroxine, cyclobenzaprine, BLACK COHOSH PO, Vitamin D-3, ranitidine, ibuprofen, oxyCODONE-acetaminophen, and estradiol.   No orders of the defined types were placed in this encounter.   Return precautions given.   Risks, benefits, and alternatives of the medications and treatment plan prescribed today were discussed, and patient expressed understanding.   Education regarding symptom management and diagnosis given to patient on AVS.  Continue to follow with Mable Paris, FNP for routine health maintenance.   Bethany Bailey and I agreed with plan.   Mable Paris, FNP

## 2016-10-22 NOTE — Patient Instructions (Signed)
Xray  Trial mobic  Alternate heat and ice.   Suspect muscle strain  Let me know if not better and we will consult ortho

## 2016-10-23 ENCOUNTER — Encounter: Payer: Self-pay | Admitting: Family

## 2016-11-05 ENCOUNTER — Ambulatory Visit (INDEPENDENT_AMBULATORY_CARE_PROVIDER_SITE_OTHER): Payer: BLUE CROSS/BLUE SHIELD | Admitting: *Deleted

## 2016-11-05 DIAGNOSIS — S46002A Unspecified injury of muscle(s) and tendon(s) of the rotator cuff of left shoulder, initial encounter: Secondary | ICD-10-CM | POA: Diagnosis not present

## 2016-11-05 DIAGNOSIS — M722 Plantar fascial fibromatosis: Secondary | ICD-10-CM | POA: Diagnosis not present

## 2016-11-05 DIAGNOSIS — M25312 Other instability, left shoulder: Secondary | ICD-10-CM | POA: Diagnosis not present

## 2016-11-05 NOTE — Progress Notes (Signed)
Patient in for 3 new pair of FO.  Very pleased with Everfeet product.Hulen Skains Ever and ordered.Called patient and patient will come back and p/up in three weeks.

## 2016-11-06 ENCOUNTER — Other Ambulatory Visit: Payer: Self-pay | Admitting: Orthopedic Surgery

## 2016-11-06 ENCOUNTER — Ambulatory Visit: Payer: BLUE CROSS/BLUE SHIELD

## 2016-11-06 DIAGNOSIS — M25312 Other instability, left shoulder: Secondary | ICD-10-CM

## 2016-11-06 DIAGNOSIS — S46002A Unspecified injury of muscle(s) and tendon(s) of the rotator cuff of left shoulder, initial encounter: Secondary | ICD-10-CM

## 2016-11-20 ENCOUNTER — Ambulatory Visit
Admission: RE | Admit: 2016-11-20 | Discharge: 2016-11-20 | Disposition: A | Discharge status: Home / Self Care | Payer: BLUE CROSS/BLUE SHIELD | Source: Ambulatory Visit | Attending: Orthopedic Surgery | Admitting: Orthopedic Surgery

## 2016-11-20 DIAGNOSIS — M75112 Incomplete rotator cuff tear or rupture of left shoulder, not specified as traumatic: Secondary | ICD-10-CM | POA: Diagnosis not present

## 2016-11-20 DIAGNOSIS — M19012 Primary osteoarthritis, left shoulder: Secondary | ICD-10-CM | POA: Diagnosis not present

## 2016-11-20 DIAGNOSIS — M75102 Unspecified rotator cuff tear or rupture of left shoulder, not specified as traumatic: Secondary | ICD-10-CM | POA: Diagnosis not present

## 2016-11-20 DIAGNOSIS — M7582 Other shoulder lesions, left shoulder: Secondary | ICD-10-CM | POA: Insufficient documentation

## 2016-11-20 DIAGNOSIS — M25312 Other instability, left shoulder: Secondary | ICD-10-CM

## 2016-11-20 DIAGNOSIS — S46002A Unspecified injury of muscle(s) and tendon(s) of the rotator cuff of left shoulder, initial encounter: Secondary | ICD-10-CM | POA: Diagnosis present

## 2016-11-21 ENCOUNTER — Encounter: Payer: Self-pay | Admitting: Obstetrics and Gynecology

## 2016-11-21 ENCOUNTER — Ambulatory Visit (INDEPENDENT_AMBULATORY_CARE_PROVIDER_SITE_OTHER): Payer: BLUE CROSS/BLUE SHIELD | Admitting: Obstetrics and Gynecology

## 2016-11-21 VITALS — BP 120/74 | HR 80 | Ht 65.0 in | Wt 182.3 lb

## 2016-11-21 DIAGNOSIS — Z9889 Other specified postprocedural states: Secondary | ICD-10-CM

## 2016-11-21 DIAGNOSIS — Z79890 Hormone replacement therapy (postmenopausal): Secondary | ICD-10-CM

## 2016-11-21 MED ORDER — ESTRADIOL 0.075 MG/24HR TD PTWK: 0.0750 mg | MEDICATED_PATCH | TRANSDERMAL | 12 refills | Status: DC

## 2016-11-21 NOTE — Progress Notes (Addendum)
HPI:      [name] is a 51 y.o. G0P0000 who LMP was Patient's last menstrual period was 09/23/2016 (exact date).  Subjective:   She presents today 6 weeks postop. She is doing well. She is feeling much better. She has occasional hot flashes and would like to try a higher dose of estrogen patch. No issues with activity, bowel movements, urination or eating. Does complain of occasional vaginal odor.    Hx: The following portions of the patient's history were reviewed and updated as appropriate:             She  has a past medical history of GERD (gastroesophageal reflux disease) and Hypothyroid. She  does not have any pertinent problems on file. She  has a past surgical history that includes Tubal ligation (1999); Novasure ablation (2012); Cervical cone biopsy (1990); and Abdominal hysterectomy (Bilateral, 10/10/2016). Her family history includes Alzheimer's disease in her paternal grandmother; Dementia in her maternal grandmother; Diabetes in her maternal grandfather, maternal uncle, and mother; Hypothyroidism in her mother and son; Stroke in her maternal grandmother. She  reports that she has never smoked. She has never used smokeless tobacco. She reports that she drinks alcohol. She reports that she does not use drugs. She is allergic to aspirin; erythromycin; food; and sulfa antibiotics.       Review of Systems:  Review of Systems  Constitutional: Denied constitutional symptoms, night sweats, recent illness, fatigue, fever, insomnia and weight loss.  Eyes: Denied eye symptoms, eye pain, photophobia, vision change and visual disturbance.  Ears/Nose/Throat/Neck: Denied ear, nose, throat or neck symptoms, hearing loss, nasal discharge, sinus congestion and sore throat.  Cardiovascular: Denied cardiovascular symptoms, arrhythmia, chest pain/pressure, edema, exercise intolerance, orthopnea and palpitations.  Respiratory: Denied pulmonary symptoms, asthma, pleuritic pain,  productive sputum, cough, dyspnea and wheezing.  Gastrointestinal: Denied, gastro-esophageal reflux, melena, nausea and vomiting.  Genitourinary: Denied genitourinary symptoms including symptomatic vaginal discharge, pelvic relaxation issues, and urinary complaints.  Musculoskeletal: Denied musculoskeletal symptoms, stiffness, swelling, muscle weakness and myalgia.  Dermatologic: Denied dermatology symptoms, rash and scar.  Neurologic: Denied neurology symptoms, dizziness, headache, neck pain and syncope.  Psychiatric: Denied psychiatric symptoms, anxiety and depression.  Endocrine: See HPI for additional information.   Meds:   Current Outpatient Prescriptions on File Prior to Visit  Medication Sig Dispense Refill  . cyclobenzaprine (FLEXERIL) 10 MG tablet Take 10 mg by mouth 3 (three) times daily as needed for muscle spasms.     Marland Kitchen ibuprofen (ADVIL,MOTRIN) 200 MG tablet Take 3 tablets (600 mg total) by mouth every 6 (six) hours as needed for mild pain (for pain/headache.). 30 tablet 0  . levothyroxine (SYNTHROID, LEVOTHROID) 100 MCG tablet Take 100 mcg by mouth daily before breakfast.    . ranitidine (ZANTAC) 150 MG tablet Take 150 mg by mouth 2 (two) times daily.    . Cholecalciferol (VITAMIN D-3) 5000 units TABS Take 5,000 Units by mouth daily with supper.     No current facility-administered medications on file prior to visit.     Objective:     Vitals:   11/21/16 0933  BP: 120/74  Pulse: 80   Abdomen: Soft.  Non-tender.  No masses.  No HSM.  Incision/s: Intact.  Healing well.  No erythema.  No drainage.    Pelvic:   Vulva: Normal appearance.  No lesions.  Vagina: No lesions or abnormalities noted.  No abnormal discharge noted   Support: Normal pelvic support.  Urethra No masses tenderness  or scarring.  Meatus Normal size without lesions or prolapse  Vag Cuff: Intact.  No lesions.  Anus: Normal exam.  No lesions.  Perineum: Normal exam.  No lesions.        Bimanual    Adnexae: No masses.  Non-tender to palpation.  Cuff: Negative for abnormality.         Assessment:    G0P0000 Patient Active Problem List   Diagnosis Date Noted  . Post-operative state 10/10/2016  . Hypothyroidism 02/07/2016  . Well woman exam with routine gynecological exam 02/07/2016  . High risk heterosexual behavior 01/23/2015     1. Post-operative state   2. Postmenopausal hormone therapy     Because patient still has occasional hot flushes will increase her dose of estrogen. She is otherwise doing very well postoperatively.   Plan:            1.  Increase E2 patch  2.  Patient may resume normal activities  3.  Patient to schedule annual exam and mammography. At that time we will recheck for vaginal infection if she is still complaining of vaginal odor. We will also follow-up on her increased dose of E2.   Meds ordered this encounter  Medications  . DISCONTD: estradiol (CLIMARA - DOSED IN MG/24 HR) 0.05 mg/24hr patch    Sig: Place onto the skin.  Marland Kitchen estradiol (CLIMARA - DOSED IN MG/24 HR) 0.075 mg/24hr patch    Sig: Place 1 patch (0.075 mg total) onto the skin once a week.    Dispense:  4 patch    Refill:  12        F/U  Return in about 3 weeks (around 12/12/2016).  Finis Bud, M.D. 11/21/2016 10:05 AM

## 2016-12-05 ENCOUNTER — Telehealth: Payer: Self-pay | Admitting: Obstetrics and Gynecology

## 2016-12-05 DIAGNOSIS — M7541 Impingement syndrome of right shoulder: Secondary | ICD-10-CM | POA: Diagnosis not present

## 2016-12-05 NOTE — Telephone Encounter (Signed)
Patient called requesting a script for progesterone. Thanks

## 2016-12-08 ENCOUNTER — Telehealth: Payer: Self-pay

## 2016-12-10 ENCOUNTER — Ambulatory Visit (INDEPENDENT_AMBULATORY_CARE_PROVIDER_SITE_OTHER): Payer: BLUE CROSS/BLUE SHIELD | Admitting: Obstetrics and Gynecology

## 2016-12-10 ENCOUNTER — Encounter: Payer: Self-pay | Admitting: Obstetrics and Gynecology

## 2016-12-10 VITALS — BP 129/87 | HR 114 | Ht 65.0 in | Wt 186.4 lb

## 2016-12-10 DIAGNOSIS — N951 Menopausal and female climacteric states: Secondary | ICD-10-CM | POA: Diagnosis not present

## 2016-12-10 DIAGNOSIS — Z0000 Encounter for general adult medical examination without abnormal findings: Secondary | ICD-10-CM

## 2016-12-10 DIAGNOSIS — Z79890 Hormone replacement therapy (postmenopausal): Secondary | ICD-10-CM

## 2016-12-10 MED ORDER — ESTROGENS CONJUGATED 0.9 MG PO TABS: 0.9000 mg | ORAL_TABLET | Freq: Every day | ORAL | 1 refills | Status: DC

## 2016-12-10 NOTE — Progress Notes (Signed)
HPI:      Bethany Bailey is a 51 y.o. G0P0000 who LMP was Patient's last menstrual period was 09/23/2016 (exact date).  Subjective:   She presents today Requesting her annual examination and would like to discuss HRT. She continues to complain of multiple issues which include: Occasional hot flashes, difficulty sleeping, increased acne, dry eyes, change in vision, generalized fatigue, vaginal dryness, lack of sex drive. She did not like the increased estrogen dose found in the patch and she discontinued it and went back to her other days. She also began taking some progesterone that she had. She has been searching for a combination of hormones that make her feel better. She reports that her thyroid has been recently checked was dosed appropriately.    Hx: The following portions of the patient's history were reviewed and updated as appropriate:             She  has a past medical history of GERD (gastroesophageal reflux disease) and Hypothyroid. She  does not have any pertinent problems on file. She  has a past surgical history that includes Tubal ligation (1999); Novasure ablation (2012); Cervical cone biopsy (1990); and Abdominal hysterectomy (Bilateral, 10/10/2016). Her family history includes Alzheimer's disease in her paternal grandmother; Dementia in her maternal grandmother; Diabetes in her maternal grandfather, maternal uncle, and mother; Hypothyroidism in her mother and son; Stroke in her maternal grandmother. She  reports that she has never smoked. She has never used smokeless tobacco. She reports that she drinks alcohol. She reports that she does not use drugs. She is allergic to aspirin; erythromycin; food; and sulfa antibiotics.       Review of Systems:  Review of Systems  Constitutional: Denied constitutional symptoms, night sweats, recent illness, fatigue, fever, insomnia and weight loss.  Eyes: Complains of dry eyes and change in vision.  Ears/Nose/Throat/Neck: Denied ear,  nose, throat or neck symptoms, hearing loss, nasal discharge, sinus congestion and sore throat.  Cardiovascular: Denied cardiovascular symptoms, arrhythmia, chest pain/pressure, edema, exercise intolerance, orthopnea and palpitations.  Respiratory: Denied pulmonary symptoms, asthma, pleuritic pain, productive sputum, cough, dyspnea and wheezing.  Gastrointestinal: Denied, gastro-esophageal reflux, melena, nausea and vomiting.  Genitourinary: See HPI for additional information.  Musculoskeletal: Left shoulder pain  Dermatologic: Denied dermatology symptoms, rash and scar.  Neurologic: Denied neurology symptoms, dizziness, headache, neck pain and syncope.  Psychiatric: Denied psychiatric symptoms, anxiety and depression.  Endocrine: Complains of difficulty sleeping and hot flashes.    Meds:   Current Outpatient Prescriptions on File Prior to Visit  Medication Sig Dispense Refill  . Cholecalciferol (VITAMIN D-3) 5000 units TABS Take 5,000 Units by mouth daily with supper.    . cyclobenzaprine (FLEXERIL) 10 MG tablet Take 10 mg by mouth 3 (three) times daily as needed for muscle spasms.     Marland Kitchen ibuprofen (ADVIL,MOTRIN) 200 MG tablet Take 3 tablets (600 mg total) by mouth every 6 (six) hours as needed for mild pain (for pain/headache.). 30 tablet 0  . levothyroxine (SYNTHROID, LEVOTHROID) 100 MCG tablet Take 100 mcg by mouth daily before breakfast.     No current facility-administered medications on file prior to visit.     Objective:     Vitals:   12/10/16 0844  BP: 129/87  Pulse: (!) 114              Physical examination General NAD, Conversant  HEENT Atraumatic; Op clear with mmm.  Normo-cephalic. Pupils reactive. Anicteric sclerae  Thyroid/Neck Smooth without nodularity or enlargement.  Normal ROM.  Neck Supple.  Skin No rashes, lesions or ulceration. Normal palpated skin turgor. No nodularity.  Breasts: No masses or discharge.  Symmetric.  No axillary adenopathy.  Lungs: Clear to  auscultation.No rales or wheezes. Normal Respiratory effort, no retractions.  Heart: NSR.  No murmurs or rubs appreciated. No periferal edema  Abdomen: Soft.  Non-tender.  No masses.  No HSM. No hernia  Extremities: Moves all appropriately.  Normal ROM for age. No lymphadenopathy.  Neuro: Oriented to PPT.  Normal mood. Normal affect.     Pelvic:   Vulva: Normal appearance.  No lesions.  Estrogenized   Vagina: No lesions or abnormalities noted.  Support: Normal pelvic support.  Urethra No masses tenderness or scarring.  Meatus Normal size without lesions or prolapse.  Cervix: Surgically absent   Anus: Normal exam.  No lesions.  Perineum: Normal exam.  No lesions.        Bimanual   Uterus: Surgically absent   Adnexae: No masses.  Non-tender to palpation.  Cul-de-sac: Negative for abnormality.     Assessment:    G0P0000 Patient Active Problem List   Diagnosis Date Noted  . Post-operative state 10/10/2016  . Hypothyroidism 02/07/2016  . Well woman exam with routine gynecological exam 02/07/2016  . High risk heterosexual behavior 01/23/2015     1. Encounter for annual physical exam   2. Symptomatic menopausal or female climacteric states   3. Postmenopausal hormone therapy     Patient is having lack of estrogen issues as well as possibly having estrogen side effect issues.   Plan:            1.  I will change her from estrogen patches to oral dosing and I have advised her to stop juggling home doses. She will stop patches and we will attempt to manage her estrogen using the increased range of possibilities with oral dosing.  We will try for 6 weeks.   2.  Mammogram ordered    3.  Patient has an internist for her general medical care and thyroid replacement.   Meds ordered this encounter  Medications  . estrogens, conjugated, (PREMARIN) 0.9 MG tablet    Sig: Take 1 tablet (0.9 mg total) by mouth daily.    Dispense:  30 tablet    Refill:  1        F/U  Return in  about 6 weeks (around 01/21/2017).  Bethany Bailey, M.D. 12/10/2016 9:54 AM

## 2016-12-12 ENCOUNTER — Encounter: Payer: Self-pay | Admitting: Obstetrics and Gynecology

## 2016-12-13 ENCOUNTER — Encounter: Payer: Self-pay | Admitting: Obstetrics and Gynecology

## 2016-12-15 ENCOUNTER — Other Ambulatory Visit: Payer: Self-pay

## 2016-12-15 MED ORDER — ESTROGENS CONJ SYNTHETIC A 0.9 MG PO TABS: 0.9000 mg | ORAL_TABLET | Freq: Every day | ORAL | 0 refills | Status: DC

## 2016-12-15 NOTE — Telephone Encounter (Signed)
Spoke with pt- confirmed pharmacy and reviewed medication with pt understanding.

## 2016-12-16 ENCOUNTER — Telehealth: Payer: Self-pay | Admitting: Obstetrics and Gynecology

## 2016-12-16 NOTE — Telephone Encounter (Signed)
°  Patient called in and is having trouble at her pharmacy, Patient informed me that her medication is supposed to be plant based, and the medication that was sent in called in (estrogens, conjugated, (PREMARIN) 0.9 MG tablet) is not "Plant based". Patient would like to speak with Dr. Amalia Hailey, to discuss which medication option fits her needs, and would like the correct medication to be filled, as soon as it possibly can. Please advise.

## 2016-12-18 ENCOUNTER — Emergency Department
Admission: EM | Admit: 2016-12-18 | Discharge: 2016-12-18 | Disposition: A | Discharge status: Home / Self Care | Payer: BLUE CROSS/BLUE SHIELD | Attending: Emergency Medicine | Admitting: Emergency Medicine

## 2016-12-18 ENCOUNTER — Encounter: Payer: Self-pay | Admitting: Emergency Medicine

## 2016-12-18 ENCOUNTER — Emergency Department: Payer: BLUE CROSS/BLUE SHIELD

## 2016-12-18 DIAGNOSIS — R51 Headache: Secondary | ICD-10-CM | POA: Diagnosis not present

## 2016-12-18 DIAGNOSIS — Y939 Activity, unspecified: Secondary | ICD-10-CM | POA: Insufficient documentation

## 2016-12-18 DIAGNOSIS — Y999 Unspecified external cause status: Secondary | ICD-10-CM | POA: Diagnosis not present

## 2016-12-18 DIAGNOSIS — S060X0A Concussion without loss of consciousness, initial encounter: Secondary | ICD-10-CM

## 2016-12-18 DIAGNOSIS — S0990XA Unspecified injury of head, initial encounter: Secondary | ICD-10-CM | POA: Diagnosis not present

## 2016-12-18 DIAGNOSIS — R110 Nausea: Secondary | ICD-10-CM | POA: Diagnosis not present

## 2016-12-18 DIAGNOSIS — Z79899 Other long term (current) drug therapy: Secondary | ICD-10-CM | POA: Diagnosis not present

## 2016-12-18 DIAGNOSIS — W19XXXA Unspecified fall, initial encounter: Secondary | ICD-10-CM | POA: Diagnosis not present

## 2016-12-18 DIAGNOSIS — E039 Hypothyroidism, unspecified: Secondary | ICD-10-CM | POA: Diagnosis not present

## 2016-12-18 DIAGNOSIS — Y92008 Other place in unspecified non-institutional (private) residence as the place of occurrence of the external cause: Secondary | ICD-10-CM | POA: Insufficient documentation

## 2016-12-18 MED ORDER — ONDANSETRON 4 MG PO TBDP: 4.0000 mg | ORAL_TABLET | Freq: Three times a day (TID) | ORAL | 0 refills | Status: DC | PRN

## 2016-12-18 MED ORDER — ONDANSETRON 4 MG PO TBDP
4.0000 mg | ORAL_TABLET | Freq: Once | ORAL | Status: AC
  Administered 2016-12-18: 4 mg via ORAL

## 2016-12-18 NOTE — ED Notes (Signed)
Pt alert and oriented X4, active, cooperative, pt in NAD. RR even and unlabored, color WNL.  Pt informed to return if any life threatening symptoms occur.   

## 2016-12-18 NOTE — ED Notes (Signed)
Pt fell on left side in garage on Monday, left shoulder and side fell onto dog bed. Pt unsure is she hit her head, no LOC. No bruising or deformity to body present. Pt reports dizziness and heaviness feeling in head that began yesterday. Pt ambulates. Pt alert and oriented X4, active, cooperative, pt in NAD. RR even and unlabored, color WNL.

## 2016-12-18 NOTE — ED Provider Notes (Signed)
[facility] Emergency Department Provider Note  Time seen: 8:57 AM  I have reviewed the triage vital signs and the nursing notes.   HISTORY  Chief Complaint Fall    HPI Bethany Bailey is a 51 y.o. female with a past medical history of gastric reflux, presents to the emergency department with a headache and nausea. According to the patient she fell Monday evening onto a cement floor in her garage. Does not believe she hit her head but states she did hit the ground fairly hard jarring her head. Denies LOC. States Tuesday she felt fairly normal, yesterday she developed significant dizziness which is abnormal for her. She states the dizziness has resolved but today she is feeling a mild headache with significant nausea. Denies any vomiting. States 1 or 2 episodes of diarrhea. Denies abdominal pain or chest pain. Denies shortness of breath or fever. Denies focal weakness or numbness. Denies any confusion or slurred speech.  Past Medical History:  Diagnosis Date  . GERD (gastroesophageal reflux disease)   . Hypothyroid     Patient Active Problem List   Diagnosis Date Noted  . Post-operative state 10/10/2016  . Hypothyroidism 02/07/2016  . Well woman exam with routine gynecological exam 02/07/2016  . High risk heterosexual behavior 01/23/2015    Past Surgical History:  Procedure Laterality Date  . ABDOMINAL HYSTERECTOMY Bilateral 10/10/2016   Procedure: HYSTERECTOMY ABDOMINAL WITH BILATERAL SALPINGO OOPHERECTOMY;  Surgeon: Harlin Heys, MD;  Location: [facility] ORS;  Service: Gynecology;  Laterality: Bilateral;  . CERVICAL CONE BIOPSY  1990  . [place]  2012  . TUBAL LIGATION  1999    Prior to Admission medications   Medication Sig Start Date End Date Taking? Authorizing Provider  Cholecalciferol (VITAMIN D-3) 5000 units TABS Take 5,000 Units by mouth daily with supper.    [provider]  cyclobenzaprine (FLEXERIL) 10 MG tablet Take 10 mg  by mouth 3 (three) times daily as needed for muscle spasms.  12/08/13   [provider]  estrogens conjugated, synthetic A, (CENESTIN) 0.9 MG tablet Take 1 tablet (0.9 mg total) by mouth daily. 12/15/16   Harlin Heys, MD  estrogens, conjugated, (PREMARIN) 0.9 MG tablet Take 1 tablet (0.9 mg total) by mouth daily. 12/10/16 03/10/17  Harlin Heys, MD  ibuprofen (ADVIL,MOTRIN) 200 MG tablet Take 3 tablets (600 mg total) by mouth every 6 (six) hours as needed for mild pain (for pain/headache.). 10/12/16   Rubie Maid, MD  levothyroxine (SYNTHROID, LEVOTHROID) 100 MCG tablet Take 100 mcg by mouth daily before breakfast.    [provider]    Allergies  Allergen Reactions  . Aspirin Other (See Comments)    Ears ring  . Erythromycin Nausea And Vomiting  . Food     Wheat-coughing/sneezing & joint pain.  . Sulfa Antibiotics Rash    Family History  Problem Relation Age of Onset  . Diabetes Mother   . Hypothyroidism Mother   . Diabetes Maternal Uncle   . Dementia Maternal Grandmother   . Stroke Maternal Grandmother   . Diabetes Maternal Grandfather   . Alzheimer's disease Paternal Grandmother   . Hypothyroidism Son     Social History Social History  Substance Use Topics  . Smoking status: Never Smoker  . Smokeless tobacco: Never Used  . Alcohol use 0.0 oz/week     Comment: Occasional     Review of Systems Constitutional: Negative for fever. Eyes: Negative for visual changes. ENT: Negative for congestion Cardiovascular:  Negative for chest pain. Respiratory: Negative for shortness of breath. Gastrointestinal: Negative for abdominal pain. Positive for nausea. Negative for vomiting. Positive for diarrhea. Genitourinary: Negative for dysuria. Musculoskeletal: Negative for back pain. Skin: Negative for rash. Neurological: Mild headache. No focal weakness or numbness. All other ROS negative  ____________________________________________   PHYSICAL  EXAM:  VITAL SIGNS: ED Triage Vitals  Enc Vitals Group     BP 12/18/16 0827 (!) 156/89     Pulse Rate 12/18/16 0827 77     Resp 12/18/16 0827 18     Temp 12/18/16 0827 98.2 F (36.8 C)     Temp Source 12/18/16 0827 Oral     SpO2 12/18/16 0827 99 %     Weight 12/18/16 0827 186 lb (84.4 kg)     Height 12/18/16 0827 5\' 5"  (1.651 m)     Head Circumference --      Peak Flow --      Pain Score 12/18/16 0848 0     Pain Loc --      Pain Edu? --      Excl. in [place]? --     Constitutional: Alert and oriented. Well appearing and in no distress. Eyes: Normal exam ENT   Head: Normocephalic and atraumatic. Normal tympanic membranes.   Mouth/Throat: Mucous membranes are moist. Cardiovascular: Normal rate, regular rhythm. No murmur Respiratory: Normal respiratory effort without tachypnea nor retractions. Breath sounds are clear  Gastrointestinal: Soft and nontender. No distention.   Musculoskeletal: Nontender with normal range of motion in all extremities.  Neurologic:  Normal speech and language. No gross focal neurologic deficits. Equal grip strengths. No pronator drift. No lower extremity drift. No cranial nerve deficits. 5/5 motor in all extremities. Skin:  Skin is warm, dry and intact.  Psychiatric: Mood and affect are normal.   ____________________________________________    RADIOLOGY  CT head negative  ____________________________________________   INITIAL IMPRESSION / ASSESSMENT AND PLAN / ED COURSE  Pertinent labs & imaging results that were available during my care of the patient were reviewed by me and considered in my medical decision making (see chart for details).  Patient presents the emergency department after a fall 2 days ago now with significant nausea and dizziness yesterday which has since resolved. We will treat the patient's nausea with Zofran. We'll obtain a CT scan of the head to rule out intracranial abnormality such as a subdural hematoma. Patient's  symptoms are somewhat suggestive of a concussion. Patient is not sure if she hit her head or not. States she did have a headache following the fall, felt like her brain got "sloshed" around.  Patient CT scan is negative. Highly suspect postconcussive syndrome. I discussed precautions with the patient. We'll discharge with Zofran, over-the-counter Tylenol/ibuprofen to be used for headache as needed, as written on the box. Overall the patient appears very well we'll follow up with her primary care doctor.  ____________________________________________   FINAL CLINICAL IMPRESSION(S) / ED DIAGNOSES  Fall Headache Nausea    Harvest Dark, MD 12/18/16 [phone]

## 2016-12-18 NOTE — ED Triage Notes (Addendum)
Pt reports fell in garage Monday and didn't think she hit her head then.  Pt did hit hard.  Felt head jerk but doesn't think hit it.  Pt has had vertigo yesterday but went away.  having fullness in head today along with severe nausea.  No vomiting.  No LOC with event.  Pt states "my brain got sloshed around"

## 2016-12-29 ENCOUNTER — Encounter: Payer: Self-pay | Admitting: Emergency Medicine

## 2016-12-29 ENCOUNTER — Emergency Department
Admission: EM | Admit: 2016-12-29 | Discharge: 2016-12-29 | Disposition: A | Discharge status: Home / Self Care | Payer: BLUE CROSS/BLUE SHIELD | Attending: Emergency Medicine | Admitting: Emergency Medicine

## 2016-12-29 DIAGNOSIS — E039 Hypothyroidism, unspecified: Secondary | ICD-10-CM | POA: Insufficient documentation

## 2016-12-29 DIAGNOSIS — F0781 Postconcussional syndrome: Secondary | ICD-10-CM | POA: Diagnosis not present

## 2016-12-29 DIAGNOSIS — G43909 Migraine, unspecified, not intractable, without status migrainosus: Secondary | ICD-10-CM | POA: Diagnosis not present

## 2016-12-29 DIAGNOSIS — R51 Headache: Secondary | ICD-10-CM | POA: Diagnosis present

## 2016-12-29 DIAGNOSIS — Z79899 Other long term (current) drug therapy: Secondary | ICD-10-CM | POA: Diagnosis not present

## 2016-12-29 LAB — BASIC METABOLIC PANEL
Anion gap: 7 (ref 5–15)
BUN: 15 mg/dL (ref 6–20)
CALCIUM: 9.7 mg/dL (ref 8.9–10.3)
CHLORIDE: 104 mmol/L (ref 101–111)
CO2: 28 mmol/L (ref 22–32)
CREATININE: 0.74 mg/dL (ref 0.44–1.00)
Glucose, Bld: 97 mg/dL (ref 65–99)
Potassium: 4 mmol/L (ref 3.5–5.1)
SODIUM: 139 mmol/L (ref 135–145)

## 2016-12-29 LAB — CBC
HCT: 39.9 % (ref 35.0–47.0)
Hemoglobin: 13.8 g/dL (ref 12.0–16.0)
MCH: 29.7 pg (ref 26.0–34.0)
MCHC: 34.6 g/dL (ref 32.0–36.0)
MCV: 85.9 fL (ref 80.0–100.0)
PLATELETS: 236 10*3/uL (ref 150–440)
RBC: 4.64 MIL/uL (ref 3.80–5.20)
RDW: 13.5 % (ref 11.5–14.5)
WBC: 7.1 10*3/uL (ref 3.6–11.0)

## 2016-12-29 MED ORDER — DIPHENHYDRAMINE HCL 50 MG/ML IJ SOLN
50.0000 mg | Freq: Once | INTRAMUSCULAR | Status: AC
  Administered 2016-12-29: 50 mg via INTRAVENOUS
  Filled 2016-12-29: qty 1

## 2016-12-29 MED ORDER — OXYCODONE-ACETAMINOPHEN 5-325 MG PO TABS
ORAL_TABLET | ORAL | Status: AC
  Administered 2016-12-29: 1 via ORAL
  Filled 2016-12-29: qty 1

## 2016-12-29 MED ORDER — PROCHLORPERAZINE EDISYLATE 5 MG/ML IJ SOLN
10.0000 mg | Freq: Once | INTRAMUSCULAR | Status: AC
  Administered 2016-12-29: 10 mg via INTRAVENOUS
  Filled 2016-12-29: qty 2

## 2016-12-29 MED ORDER — DEXAMETHASONE SODIUM PHOSPHATE 10 MG/ML IJ SOLN
10.0000 mg | Freq: Once | INTRAMUSCULAR | Status: AC
  Administered 2016-12-29: 10 mg via INTRAVENOUS
  Filled 2016-12-29: qty 1

## 2016-12-29 MED ORDER — SODIUM CHLORIDE 0.9 % IV BOLUS (SEPSIS)
1000.0000 mL | Freq: Once | INTRAVENOUS | Status: AC
  Administered 2016-12-29: 1000 mL via INTRAVENOUS

## 2016-12-29 MED ORDER — OXYCODONE-ACETAMINOPHEN 5-325 MG PO TABS
1.0000 | ORAL_TABLET | ORAL | Status: DC | PRN
  Administered 2016-12-29: 1 via ORAL

## 2016-12-29 NOTE — ED Triage Notes (Signed)
Pt states she woke up this am with a migraine, has hx of the same. Nausea, no vomiting. Sensitive to light.

## 2016-12-29 NOTE — ED Notes (Signed)
NAD noted at time of D/C. Pt denies questions or concerns. Pt ambulatory to the lobby at this time.  

## 2016-12-29 NOTE — ED Notes (Signed)
Dr. Mable Paris at bedside at this time.

## 2016-12-29 NOTE — ED Notes (Signed)
This RN to bedside at this time. Apologized for delay in this RN coming to room due to critical patient. Pt states understanding. Pt is A&O at this time. NAD noted. Will continue to monitor, explained to patient, will return with medications.

## 2016-12-29 NOTE — ED Notes (Signed)
Pt denies need for zofran at this time.

## 2016-12-29 NOTE — ED Notes (Signed)
Pt states got up to go to the bathroom. Denies any pain at this time, c/o continued dizziness. Pt remains alert and oriented at this time. Denies further needs. Will continue to monitor for further patient needs.

## 2016-12-29 NOTE — ED Provider Notes (Signed)
[facility] Emergency Department Provider Note  ____________________________________________   First MD Initiated Contact with Patient 12/29/16 [phone]     (approximate)  I have reviewed the triage vital signs and the nursing notes.   HISTORY  Chief Complaint Migraine   HPI Bethany Bailey is a 51 y.o. female who self presents to the emergency department with gradual onset not maximal onset left sided head throbbing discomfort similar to previous migraine headaches. She has a long-standing history of migraine since age 68. She typically gets about one a month but is not had one in some time. 2 weeks ago she fell and her garage she sustained a head injury for which she had a negative head CT and was diagnosed with a concussion. She's had persistent concussive symptoms since then with difficulty concentrating and difficulty sleeping.Her pain is associated with photophobia and nausea.   Past Medical History:  Diagnosis Date  . GERD (gastroesophageal reflux disease)   . Hypothyroid     Patient Active Problem List   Diagnosis Date Noted  . Post-operative state 10/10/2016  . Hypothyroidism 02/07/2016  . Well woman exam with routine gynecological exam 02/07/2016  . High risk heterosexual behavior 01/23/2015    Past Surgical History:  Procedure Laterality Date  . ABDOMINAL HYSTERECTOMY Bilateral 10/10/2016   Procedure: HYSTERECTOMY ABDOMINAL WITH BILATERAL SALPINGO OOPHERECTOMY;  Surgeon: Harlin Heys, MD;  Location: [facility] ORS;  Service: Gynecology;  Laterality: Bilateral;  . CERVICAL CONE BIOPSY  1990  . [place]  2012  . TUBAL LIGATION  1999    Prior to Admission medications   Medication Sig Start Date End Date Taking? Authorizing Provider  BLACK COHOSH COMPOUND PO Take 1 tablet by mouth daily.   Yes [provider]  Cholecalciferol (VITAMIN D-3) 5000 units TABS Take 5,000 Units by mouth daily with supper.   Yes [provider]  cyanocobalamin 1000 MCG tablet Take 1,000 mcg by mouth daily.   Yes [provider]  cyclobenzaprine (FLEXERIL) 10 MG tablet Take 10 mg by mouth 3 (three) times daily as needed for muscle spasms.  12/08/13  Yes [provider]  etodolac (LODINE) 400 MG tablet Take 400 mg by mouth 2 (two) times daily.   Yes [provider]  levothyroxine (SYNTHROID, LEVOTHROID) 100 MCG tablet Take 100 mcg by mouth daily before breakfast.   Yes [provider]  Red Yeast Rice Extract (RED YEAST RICE PO) Take 1 tablet by mouth daily.   Yes [provider]  estrogens conjugated, synthetic A, (CENESTIN) 0.9 MG tablet Take 1 tablet (0.9 mg total) by mouth daily. 12/15/16   Harlin Heys, MD  estrogens, conjugated, (PREMARIN) 0.9 MG tablet Take 1 tablet (0.9 mg total) by mouth daily. 12/10/16 03/10/17  Harlin Heys, MD  ibuprofen (ADVIL,MOTRIN) 200 MG tablet Take 3 tablets (600 mg total) by mouth every 6 (six) hours as needed for mild pain (for pain/headache.). 10/12/16   Rubie Maid, MD  ondansetron (ZOFRAN ODT) 4 MG disintegrating tablet Take 1 tablet (4 mg total) by mouth every 8 (eight) hours as needed for nausea or vomiting. 12/18/16   Harvest Dark, MD    Allergies Aspirin; Erythromycin; Food; and Sulfa antibiotics  Family History  Problem Relation Age of Onset  . Diabetes Mother   . Hypothyroidism Mother   . Diabetes Maternal Uncle   . Dementia Maternal Grandmother   . Stroke Maternal Grandmother   . Diabetes Maternal Grandfather   . Alzheimer's disease  Paternal Grandmother   . Hypothyroidism Son     Social History Social History  Substance Use Topics  . Smoking status: Never Smoker  . Smokeless tobacco: Never Used  . Alcohol use 0.0 oz/week     Comment: Occasional     Review of Systems Constitutional: No fever/chills Eyes: No visual changes. ENT: No sore throat. Cardiovascular: Denies chest pain. Respiratory: Denies shortness of  breath. Gastrointestinal: No abdominal pain.  Positive nausea, no vomiting.  No diarrhea.  No constipation. Genitourinary: Negative for dysuria. Musculoskeletal: Negative for back pain. Skin: Negative for rash. Neurological: Positive headaches   ____________________________________________   PHYSICAL EXAM:  VITAL SIGNS: ED Triage Vitals [12/29/16 0750]  Enc Vitals Group     BP (!) 171/102     Pulse Rate 90     Resp 18     Temp 98 F (36.7 C)     Temp Source Oral     SpO2 100 %     Weight 186 lb (84.4 kg)     Height 5\' 5"  (1.651 m)     Head Circumference      Peak Flow      Pain Score 7     Pain Loc      Pain Edu?      Excl. in [place]?     Constitutional: Alert and oriented 4 appears uncomfortable shielding her eyes and artery injury Eyes: PERRL EOMI. Head: Atraumatic. Nose: No congestion/rhinnorhea. Mouth/Throat: No trismus Neck: No stridor.   Cardiovascular: Normal rate, regular rhythm. Grossly normal heart sounds.  Good peripheral circulation. Respiratory: Normal respiratory effort.  No retractions. Lungs CTAB and moving good air Gastrointestinal: Soft nontender no rebound or guarding no peritonitis Musculoskeletal: No lower extremity edema   Neurologic:  Normal speech and language. No gross focal neurologic deficits are appreciated. Skin:  Skin is warm, dry and intact. No rash noted. Psychiatric: Mood and affect are normal. Speech and behavior are normal.    ____________________________________________   DIFFERENTIAL includes but not limited to  Migraine, tension headache, postconcussive syndrome, meningitis, cerebral venous thrombosis ____________________________________________   LABS (all labs ordered are listed, but only abnormal results are displayed)  Labs Reviewed  BASIC METABOLIC PANEL  CBC    Labs  unremarkable __________________________________________  EKG   ____________________________________________  RADIOLOGY   ____________________________________________   PROCEDURES  Procedure(s) performed: no  Procedures  Critical Care performed: no  Observation: no ____________________________________________   INITIAL IMPRESSION / ASSESSMENT AND PLAN / ED COURSE  Pertinent labs & imaging results that were available during my care of the patient were reviewed by me and considered in my medical decision making (see chart for details).  The patient arrives uncomfortable appearing with gradual onset not maximal onset headache that is similar to previous. She is given Compazine and Benadryl with near complete resolution of her symptoms. I also gave her a dose of dexamethasone to help prevent recurrence. We discussed the pluses and minuses of advanced imaging and that I felt that it would not benefit [place] today and she agrees. We discussed also the normal expected course of postconcussive symptoms and she understands she can last up to a full month but if it lasts longer follow-up with her primary care physician for reevaluation. She is discharged home in improved condition.      ____________________________________________   FINAL CLINICAL IMPRESSION(S) / ED DIAGNOSES  Final diagnoses:  Migraine without status migrainosus, not intractable, unspecified migraine type  Post concussive syndrome      NEW  MEDICATIONS STARTED DURING THIS VISIT:  Discharge Medication List as of 12/29/2016 11:44 AM       Note:  This document was prepared using Dragon voice recognition software and may include unintentional dictation errors.     Darel Hong, MD 12/30/16 1229

## 2016-12-29 NOTE — Progress Notes (Signed)
Chaplain met with pt. No family present. Pt states she does not want her family to be contacted because they are out of town. Pt said has contacted a person in [place], and has co-workers whom she could reach out to when needed.     12/29/16 0900  Clinical Encounter Type  Visited With Patient  Visit Type Initial;Spiritual support  Referral From Chaplain  Consult/Referral To Chaplain  Spiritual Encounters  Spiritual Needs Prayer;Emotional;Other (Comment)

## 2016-12-29 NOTE — Discharge Instructions (Signed)
Please make sure you remain well-hydrated and follow-up with your primary care physician as needed. It is normal for the symptoms severe concussion to last up to a full month. If they last longer than a month please follow-up with your primary care physician for referral on to see a specialist. Return to the emergency department for any concerns.  It was a pleasure to take care of you today, and thank you for coming to our emergency department.  If you have any questions or concerns before leaving please ask the nurse to grab me and I'm more than happy to go through your aftercare instructions again.  If you were prescribed any opioid pain medication today such as Norco, Vicodin, Percocet, morphine, hydrocodone, or oxycodone please make sure you do not drive when you are taking this medication as it can alter your ability to drive safely.  If you have any concerns once you are home that you are not improving or are in fact getting worse before you can make it to your follow-up appointment, please do not hesitate to call 911 and come back for further evaluation.  Darel Hong MD  Results for orders placed or performed during the hospital encounter of 00/86/76  Basic metabolic panel  Result Value Ref Range   Sodium 139 135 - 145 mmol/L   Potassium 4.0 3.5 - 5.1 mmol/L   Chloride 104 101 - 111 mmol/L   CO2 28 22 - 32 mmol/L   Glucose, Bld 97 65 - 99 mg/dL   BUN 15 6 - 20 mg/dL   Creatinine, Ser 0.74 0.44 - 1.00 mg/dL   Calcium 9.7 8.9 - 10.3 mg/dL   GFR calc non Af Amer >60 >60 mL/min   GFR calc Af Amer >60 >60 mL/min   Anion gap 7 5 - 15  CBC  Result Value Ref Range   WBC 7.1 3.6 - 11.0 K/uL   RBC 4.64 3.80 - 5.20 MIL/uL   Hemoglobin 13.8 12.0 - 16.0 g/dL   HCT 39.9 35.0 - 47.0 %   MCV 85.9 80.0 - 100.0 fL   MCH 29.7 26.0 - 34.0 pg   MCHC 34.6 32.0 - 36.0 g/dL   RDW 13.5 11.5 - 14.5 %   Platelets 236 150 - 440 K/uL   Ct Head Wo Contrast  Result Date: 12/18/2016 CLINICAL DATA:   Recent fall.  Nausea. EXAM: CT HEAD WITHOUT CONTRAST TECHNIQUE: Contiguous axial images were obtained from the base of the skull through the vertex without intravenous contrast. COMPARISON:  None. FINDINGS: Brain: No evidence of acute infarction, hemorrhage, hydrocephalus, extra-axial collection or mass lesion/mass effect. Vascular: No hyperdense vessel or unexpected calcification. Skull: Normal. Negative for fracture or focal lesion. Sinuses/Orbits: Negative Other: None IMPRESSION: Negative CT head Electronically Signed   By: Franchot Gallo M.D.   On: 12/18/2016 09:30

## 2017-01-01 ENCOUNTER — Telehealth: Payer: Self-pay | Admitting: *Deleted

## 2017-01-01 NOTE — Telephone Encounter (Signed)
FYI

## 2017-01-01 NOTE — Telephone Encounter (Signed)
FYI Patient was seen at [facility] for a concussion , from a fall. Pt continues with nausea and dizziness. Theses are the same symptoms from the fall, and has been seen 2 in the ER for these symptoms. Patient would like a office visit tomorrow to get a medication to help with her nausea.  Pt has been scheduled to see Dr.Sonnenberg on 06/29 Pt contact [phone]

## 2017-01-01 NOTE — Telephone Encounter (Signed)
noted 

## 2017-01-02 ENCOUNTER — Telehealth: Payer: Self-pay | Admitting: Family

## 2017-01-02 ENCOUNTER — Ambulatory Visit (INDEPENDENT_AMBULATORY_CARE_PROVIDER_SITE_OTHER): Payer: BLUE CROSS/BLUE SHIELD | Admitting: Family Medicine

## 2017-01-02 ENCOUNTER — Encounter: Payer: Self-pay | Admitting: Family Medicine

## 2017-01-02 VITALS — BP 140/98 | HR 77 | Temp 98.3°F | Wt 184.2 lb

## 2017-01-02 DIAGNOSIS — F0781 Postconcussional syndrome: Secondary | ICD-10-CM

## 2017-01-02 MED ORDER — AMITRIPTYLINE HCL 10 MG PO TABS: 10.0000 mg | ORAL_TABLET | Freq: Every day | ORAL | 0 refills | Status: DC

## 2017-01-02 NOTE — Progress Notes (Signed)
Tommi Rumps, MD Phone: [phone]  Bethany Bailey is a 51 y.o. female who presents today for follow-up.  Patient was evaluated in the emergency room earlier this month and felt to have a concussion. She had a reassuring CT scan of her head. She did fall in her garage though did not hit her head. It was a fairly forceful fall though. Since then she has had intermittent headaches, dizziness, concentration deficits, sleep issues, and nausea. She notes she gets headaches that are worse when she is focusing particularly at work. She is unable to watch TV without symptoms. She notes no vision changes. She notes no loss of consciousness. She notes a history of migraines. She was evaluated again in the emergency room earlier this week and given a migraine cocktail. Symptoms were again felt to be postconcussive symptoms. She notes when she walks it feels as though she is walking through water. Note she does tend to list to one side or the other and that has been going on since she hit her head.  PMH: nonsmoker.   ROS see history of present illness  Objective  Physical Exam Vitals:   01/02/17 0936  BP: (!) 140/98  Pulse: 77  Temp: 98.3 F (36.8 C)    BP Readings from Last 3 Encounters:  01/02/17 (!) 140/98  12/29/16 135/80  12/18/16 (!) 150/81   Wt Readings from Last 3 Encounters:  01/02/17 184 lb 3.2 oz (83.6 kg)  12/29/16 186 lb (84.4 kg)  12/18/16 186 lb (84.4 kg)    Physical Exam  Constitutional: No distress.  HENT:  Head: Normocephalic and atraumatic.  Mouth/Throat: Oropharynx is clear and moist.  Eyes: Conjunctivae are normal. Pupils are equal, round, and reactive to light.  Cardiovascular: Normal rate, regular rhythm and normal heart sounds.   Pulmonary/Chest: Effort normal and breath sounds normal.  Neurological: She is alert.  CN 2-12 intact, 5/5 strength in bilateral biceps, triceps, grip, quads, hamstrings, plantar and dorsiflexion, sensation to light touch intact  in bilateral UE and LE, gait tends to list to the right or left and then correct to normal, 2+ patellar reflexes  Skin: She is not diaphoretic.     Assessment/Plan: Please see individual problem list.  Postconcussive syndrome Patient's symptoms most consistent with postconcussive syndrome. CT imaging previously negative. Symptoms have not progressed though have not improved. Neurologically at her baseline since the fall. Does tend to list to one side or the other when walking though corrects easily. We will write her out of work for the next week so she can get mental and physical rest. I discussed the importance of this. We'll try her on amitriptyline at bedtime to see if this is beneficial for her postconcussive headache and other symptoms. We will refer to neurology as well for evaluation. She'll follow-up with me in 1 week. She's given return precautions.   Orders Placed This Encounter  Procedures  . Ambulatory referral to Neurology    Referral Priority:   Routine    Referral Type:   Consultation    Referral Reason:   Specialty Services Required    Requested Specialty:   Neurology    Number of Visits Requested:   1    Meds ordered this encounter  Medications  . amitriptyline (ELAVIL) 10 MG tablet    Sig: Take 1 tablet (10 mg total) by mouth at bedtime.    Dispense:  14 tablet    Refill:  0   Tommi Rumps, MD [facility] -  [employer]

## 2017-01-02 NOTE — Telephone Encounter (Signed)
FMLA paperwork has been placed in provider box.

## 2017-01-02 NOTE — Patient Instructions (Signed)
Nice to see you. Your symptoms are likely related to postconcussion syndrome. I would suggest mental and physical rest. Please do not watch TV or look at a screen if possible. We'll start you on amitriptyline to see if this will be beneficial for your symptoms. We'll get you referred to neurology. If you develop worsening headaches, numbness, weakness, vision changes, dizziness, or any new or change in symptoms please seek medical attention.

## 2017-01-02 NOTE — Telephone Encounter (Signed)
Pt dropped off FMLA papers to be completed by Arnett. Papers are up front in Arnett's color folder.

## 2017-01-02 NOTE — Assessment & Plan Note (Signed)
Patient's symptoms most consistent with postconcussive syndrome. CT imaging previously negative. Symptoms have not progressed though have not improved. Neurologically at her baseline since the fall. Does tend to list to one side or the other when walking though corrects easily. We will write her out of work for the next week so she can get mental and physical rest. I discussed the importance of this. We'll try her on amitriptyline at bedtime to see if this is beneficial for her postconcussive headache and other symptoms. We will refer to neurology as well for evaluation. She'll follow-up with me in 1 week. She's given return precautions.

## 2017-01-02 NOTE — Telephone Encounter (Signed)
Seen in the office

## 2017-01-06 DIAGNOSIS — M542 Cervicalgia: Secondary | ICD-10-CM | POA: Diagnosis not present

## 2017-01-06 DIAGNOSIS — R51 Headache: Secondary | ICD-10-CM | POA: Diagnosis not present

## 2017-01-06 DIAGNOSIS — M9900 Segmental and somatic dysfunction of head region: Secondary | ICD-10-CM | POA: Diagnosis not present

## 2017-01-06 DIAGNOSIS — R278 Other lack of coordination: Secondary | ICD-10-CM | POA: Diagnosis not present

## 2017-01-09 ENCOUNTER — Encounter: Payer: Self-pay | Admitting: Family Medicine

## 2017-01-09 ENCOUNTER — Ambulatory Visit (INDEPENDENT_AMBULATORY_CARE_PROVIDER_SITE_OTHER): Payer: BLUE CROSS/BLUE SHIELD | Admitting: Family Medicine

## 2017-01-09 DIAGNOSIS — F0781 Postconcussional syndrome: Secondary | ICD-10-CM

## 2017-01-09 NOTE — Assessment & Plan Note (Signed)
Quite a bit improved. Minimal motion sickness if she turns too quickly. Discussed trying to increase her mental and physical activity over the weekend little by little. If she does well with this without any symptoms she can return to work next week on Monday. If she continues to have some symptoms I advised that she take several more days off next week and let us know how she is doing on Wednesday. She was provided with a note to allow her to return to work next Monday if she is asymptomatic and also a note to return to work next Thursday if she is continuing to have symptoms. She will determine which one to use based on symptoms. She will contact us Monday to let us know how she is going to proceed. Advised of return precautions.

## 2017-01-09 NOTE — Progress Notes (Signed)
  Tommi Rumps, MD Phone: [phone]  Bethany Bailey is a 51 y.o. female who presents today for follow-up.  Postconcussive syndrome: Patient notes symptoms have improved quite a bit. Does note some mild motion sickness if she moves too quickly though otherwise no motion sickness. No balance issues. No headaches. Notes she's been able to focus on doing things. She is done laundry and vacuuming at home with out any worsening of symptoms. She did get acupuncture and this was beneficial.  ROS see history of present illness  Objective  Physical Exam Vitals:   01/09/17 1119  BP: 134/82  Pulse: (!) 103  Resp: 16  Temp: 99.1 F (37.3 C)    BP Readings from Last 3 Encounters:  01/09/17 134/82  01/02/17 (!) 140/98  12/29/16 135/80   Wt Readings from Last 3 Encounters:  01/09/17 182 lb 12.8 oz (82.9 kg)  01/02/17 184 lb 3.2 oz (83.6 kg)  12/29/16 186 lb (84.4 kg)    Physical Exam  Constitutional: She is well-developed, well-nourished, and in no distress.  Cardiovascular: Normal rate, regular rhythm and normal heart sounds.   Pulmonary/Chest: Effort normal and breath sounds normal.  Neurological: She is alert.  CN 2-12 intact, 5/5 strength in bilateral biceps, triceps, grip, quads, hamstrings, plantar and dorsiflexion, sensation to light touch intact in bilateral UE and LE, normal gait  Skin: Skin is warm and dry.     Assessment/Plan: Please see individual problem list.  Postconcussive syndrome Quite a bit improved. Minimal motion sickness if she turns too quickly. Discussed trying to increase her mental and physical activity over the weekend little by little. If she does well with this without any symptoms she can return to work next week on Monday. If she continues to have some symptoms I advised that she take several more days off next week and let us know how she is doing on Wednesday. She was provided with a note to allow her to return to work next Monday if she is  asymptomatic and also a note to return to work next Thursday if she is continuing to have symptoms. She will determine which one to use based on symptoms. She will contact us Monday to let us know how she is going to proceed. Advised of return precautions.   Tommi Rumps, MD [place]

## 2017-01-09 NOTE — Patient Instructions (Signed)
Nice to see you. Please slowly add back mental and physical activity. I would not push it physically and I would try to use your way back into getting up and moving around. You can also try some more mentally stimulating things to see if you develop any headache, motion sickness, sensation of imbalance, or any difficulty focusing. If you do not develop any symptoms as she ease your way back in you can try going back to work on Monday. If you develop symptoms I would take a few more days off to continue recovery and let us know how you're doing next Wednesday.

## 2017-01-12 ENCOUNTER — Encounter: Payer: Self-pay | Admitting: Family Medicine

## 2017-01-12 DIAGNOSIS — R51 Headache: Secondary | ICD-10-CM | POA: Diagnosis not present

## 2017-01-12 DIAGNOSIS — R278 Other lack of coordination: Secondary | ICD-10-CM | POA: Diagnosis not present

## 2017-01-12 DIAGNOSIS — M542 Cervicalgia: Secondary | ICD-10-CM | POA: Diagnosis not present

## 2017-01-12 DIAGNOSIS — M9900 Segmental and somatic dysfunction of head region: Secondary | ICD-10-CM | POA: Diagnosis not present

## 2017-01-13 ENCOUNTER — Other Ambulatory Visit: Payer: Self-pay | Admitting: Family Medicine

## 2017-01-13 ENCOUNTER — Encounter: Payer: Self-pay | Admitting: Family Medicine

## 2017-01-13 MED ORDER — CYCLOBENZAPRINE HCL 10 MG PO TABS: 10.0000 mg | ORAL_TABLET | Freq: Three times a day (TID) | ORAL | 0 refills | Status: DC | PRN

## 2017-01-14 ENCOUNTER — Encounter: Payer: Self-pay | Admitting: Obstetrics & Gynecology

## 2017-01-14 ENCOUNTER — Ambulatory Visit (INDEPENDENT_AMBULATORY_CARE_PROVIDER_SITE_OTHER): Payer: BLUE CROSS/BLUE SHIELD | Admitting: Obstetrics & Gynecology

## 2017-01-14 VITALS — BP 130/80 | HR 83 | Ht 65.0 in | Wt 182.0 lb

## 2017-01-14 DIAGNOSIS — R232 Flushing: Secondary | ICD-10-CM | POA: Diagnosis not present

## 2017-01-14 DIAGNOSIS — Z780 Asymptomatic menopausal state: Secondary | ICD-10-CM | POA: Insufficient documentation

## 2017-01-14 NOTE — Progress Notes (Signed)
HPI:      Ms. MELODEE LUPE is a 51 y.o. G0P0000 who is postmenopausal, presents today for a problem visit.  She complains of anxiety, hot flashes, insomnia, moodiness, no energy, vaginal dryness.   Symptoms have been present for several years. Symptoms are mod to severe and has led her to come in today to seek options for intervention.   Previous Treatment: BHRT in past (combo of DHEA, Est cream, and Prog) in [place]; wishes to stay local  She is sex active. Denies PostMenopausal Bleeding. Recent hysterectomy.  PMHx: She  has a past medical history of GERD (gastroesophageal reflux disease) and Hypothyroid. Also,  has a past surgical history that includes Tubal ligation (1999); Novasure ablation (2012); Cervical cone biopsy (1990); and Abdominal hysterectomy (Bilateral, 10/10/2016)., family history includes Alzheimer's disease in her paternal grandmother; Dementia in her maternal grandmother; Diabetes in her maternal grandfather, maternal uncle, and mother; Hypothyroidism in her mother and son; Stroke in her maternal grandmother.,  reports that she has never smoked. She has never used smokeless tobacco. She reports that she drinks alcohol. She reports that she does not use drugs.  She has a current medication list which includes the following prescription(s): amitriptyline, black cohosh, vitamin d-3, cyanocobalamin, cyclobenzaprine, etodolac, ibuprofen, levothyroxine, and red yeast rice extract. Also, is allergic to aspirin; erythromycin; food; and sulfa antibiotics.  Review of Systems  Constitutional: Negative for chills, fever and malaise/fatigue.  HENT: Negative for congestion, sinus pain and sore throat.   Eyes: Negative for blurred vision and pain.  Respiratory: Negative for cough and wheezing.   Cardiovascular: Negative for chest pain and leg swelling.  Gastrointestinal: Negative for abdominal pain, constipation, diarrhea, heartburn, nausea and vomiting.  Genitourinary: Negative for  dysuria, frequency, hematuria and urgency.  Musculoskeletal: Negative for back pain, joint pain, myalgias and neck pain.  Skin: Negative for itching and rash.  Neurological: Negative for dizziness, tremors and weakness.  Endo/Heme/Allergies: Does not bruise/bleed easily.  Psychiatric/Behavioral: Negative for depression. The patient is not nervous/anxious and does not have insomnia.     Objective: BP 130/80   Pulse 83   Ht 5\' 5"  (1.651 m)   Wt 182 lb (82.6 kg)   LMP 09/23/2016 (Exact Date)   BMI 30.29 kg/m  Physical Exam  Constitutional: She is oriented to person, place, and time. She appears well-developed and well-nourished. No distress.  Musculoskeletal: Normal range of motion.  Neurological: She is alert and oriented to person, place, and time.  Skin: Skin is warm and dry.  Psychiatric: She has a normal mood and affect.  Vitals reviewed.   ASSESSMENT/PLAN:  Menopause. 1. Hot flashes 2. Menopause Patient with bothersome menopausal vasomotor symptoms. Discussed lifestyle interventions such as wearing light clothing, remaining in cool environments, having fan/air conditioner in the room, avoiding hot beverages etc.  Exercise also shown to be significantly helpful in alleviating hot flashes.  Discussed using hormone therapy and concerns about increased risk of heart disease, cerebrovascular disease, thromboembolic disease,  and breast cancer.  Also discussed other medical options such as Clonidine, SSRI, or Neurontin.   Also discussed alternative therapies such as herbal remedies but cautioned that most of the products contained phytoestrogens (plant estrogens) in unregulated amounts which can have the same effects on the body as the pharmaceutical estrogen preparations.  Patient opted for [facility] consult for therapy for now.   Referral to Alfonzo Beers at [facility] for assessment and recommendations for [facility] formulation, and then will f/u with Rx for this and careful  monitoring.  Barnett Applebaum, MD, [facility], [facility] 01/14/2017  4:57 PM

## 2017-01-15 ENCOUNTER — Telehealth: Payer: Self-pay

## 2017-01-15 NOTE — Telephone Encounter (Signed)
Referral was sent from Dr. Caryl Bis. Called patient to schedule appoint in [facility]. She states that she is still having symptoms and would like to get seen. She suffered a concussion on 12/15/2016 when she was tripped by her dog in the garage, falling onto her left side. She did not hit her head and did not have any symptoms until 2 days later. She began to develop dizziness, nausea and a headache. She went into ED and did have a CT scan performed. Results are in patient's chart. She has been going to work 1/2 days since the injury and did take one week off based on Dr. Ellen Henri recommendations but she is still struggling with the inability to work for prolonged periods of time on the computer. She did develop a migraine the other day that was on the superior aspect of her head when she usually gets them on the side of her head. Patient is going to remain out of work until seen by [place]. Recommended that she try to limit screen time and can be active for about 10 minutes each day via walking on even surfaces and doing house chores. Patient voices understanding.

## 2017-01-17 NOTE — Progress Notes (Signed)
Chief Complaint: Bethany Bailey, DOB: 1965/10/29, is a 51 y.o. female who presents for head injury. She suffered a concussion on 12/15/2016 when she was tripped by her dog in the garage, falling onto her left side. She did not hit her head and did not have any symptoms until 2 days later. She began to develop dizziness, nausea and a headache. She went into ED and did have a CT scan performed. Results are in patient's chart. She has been going to work 1/2 days since the injury and did take one week off based on Dr. Ellen Henri recommendations but she is still struggling with the inability to work for prolonged periods of time on the computer. She works as a Licensed conveyancer. She did develop a migraine the other day that was on the superior aspect of her head when she usually gets them on the side of her head. She also began to have a migraine this morning. This is no different than her regular migraines.   Injury date : 12/15/2016 Visit #: 1  History of Present Illness:    Concussion Self-Reported Symptom Score Symptoms rated on a scale 1-6, in last 24 hours  Headache: 4    Nausea: 3  Vomiting: 0  Balance Difficulty: 4  Dizziness: 5  Fatigue: 5  Trouble Falling Asleep: 4   Sleep More Than Usual: 5  Sleep Less Than Usual: 0  Daytime Drowsiness: 5  Photophobia: 3  Phonophobia: 3  Irritability: 5  Sadness: 0  Nervousness: 0  Feeling More Emotional: 0  Numbness or Tingling: 0  Feeling Slowed Down: 6  Feeling Mentally Foggy: 4  Difficulty Concentrating: 4  Difficulty Remembering: 4  Visual Problems: 4    Total Symptom Score: 68   Review of Systems: Pertinent items are noted in HPI.  Review of History: Past Medical History:  Past Medical History:  Diagnosis Date  . GERD (gastroesophageal reflux disease)   . Hypothyroid     Past Surgical History:  has a past surgical history that includes Tubal ligation (1999); Novasure ablation (2012); Cervical cone biopsy (1990); and Abdominal  hysterectomy (Bilateral, 10/10/2016). Family History: family history includes Alzheimer's disease in her paternal grandmother; Dementia in her maternal grandmother; Diabetes in her maternal grandfather, maternal uncle, and mother; Hypothyroidism in her mother and son; Stroke in her maternal grandmother. Social History:  reports that she has never smoked. She has never used smokeless tobacco. She reports that she drinks alcohol. She reports that she does not use drugs. Current Medications: has a current medication list which includes the following prescription(s): black cohosh, vitamin d-3, cyanocobalamin, cyclobenzaprine, etodolac, ibuprofen, levothyroxine, and red yeast rice extract. Allergies: is allergic to aspirin; erythromycin; food; and sulfa antibiotics.  Objective:    Physical Examination Vitals:   01/19/17 1245  BP: 118/82  Pulse: 84   General appearance: alert, appears stated age and cooperative Head: Normocephalic, without obvious abnormality, atraumatic Eyes: conjunctivae/corneas clear. PERRL, EOM's intact. Fundi benign. Sclera anicteric.Patient does have some nystagmus noted. Lungs: clear to auscultation bilaterally and percussion Heart: regular rate and rhythm, S1, S2 normal, no murmur, click, rub or gallop Neurologic: CN 2-12 normal.  Sensation to pain, touch, and proprioception normal.  DTRs  normal in upper and lower extremities. No pathologic reflexes. Neg rhomberg, modified rhomberg, pronator drift, tandem gait, finger-to-nose; see post-concussion vestibular and oculomotor testing in chart Psychiatric: Oriented X3, intact recent and remote memory, judgement and insight, normal mood and affect Vestibular Screening:       Headache  Dizziness  Smooth Pursuits n y  H. Saccades y n  V. Saccades n y  H. VOR n n  V. VOR n y  Economist n y      Convergence: 26 cm  n y    Serial sevens patient past well, patient did have difficulty with word finding.    Assessment:    Bethany HASEGAWA presents with the following concussion subtypes. [] Cognitive [] Cervical [] Vestibular [x] Ocular [x] Migraine [] Anxiety/Mood   Plan:   Action/Discussion: Reviewed diagnosis, management options, expected outcomes, and the reasons for scheduled and emergent follow-up. Questions were adequately answered. Patient expressed verbal understanding and agreement with the following plan.     Active Treatment Strategies:  Fueling your brain is important for recovery. It is essential to stay well hydrated, aiming for half of your body weight in fluid ounces per day (100 lbs = 50 oz). We also recommend eating breakfast to start your day and focus on a well-balanced diet containing lean protein, 'good' fats, and complex carbohydrates. See your nutrition / hydration handout for more details.   Quality sleep is vital in your concussion recovery. We encourage lots of sleep for the first 24-72 hours after injury but following this period it is important to regulate your sleep cycle. We encourage 8 hours of quality sleep per night. See your sleep handout for more details and strategies to quality sleep.  IF NOT USING THE OPTIONS BELOW DELETE THEM  Treating your vestibular and visual dysfunction will decrease your recovery time and improve your symptoms. Begin your home vestibular exercise program as directed on your AVS.    Begin taking Amantadine medicine as directed.   Begin taking DHA supplement as directed.    Begin home exercise program for neck as directed.   Follow-up information:  Follow up appointment at [facility] in 2 weeks.   Patient Education:  Reviewed with patient the risks (i.e, a repeat concussion, post-concussion syndrome, second-impact syndrome) of returning to play prior to complete resolution, and thoroughly reviewed the signs and symptoms of concussion.Reviewed need for complete resolution of all symptoms, with rest AND exertion, prior  to return to play.  Reviewed red flags for urgent medical evaluation: worsening symptoms, nausea/vomiting, intractable headache, musculoskeletal changes, focal neurological deficits.  [facility] Concussion Care Plan, which clearly outlines the plans stated above, was given to patient.  I was personally involved with the physical evaluation of and am in agreement with the assessment and treatment plan for this patient.  Greater than 50% of this encounter was spent in direct consultation with the patient in evaluation, counseling, and coordination of care. Duration of encounter: 50 minutes.  After Visit Summary printed out and provided to patient as appropriate.  This note is written by Lyndal Pulley, in the presence of and acting as the scribe of Lyndal Pulley, DO.

## 2017-01-19 ENCOUNTER — Encounter: Payer: Self-pay | Admitting: Family Medicine

## 2017-01-19 ENCOUNTER — Ambulatory Visit (INDEPENDENT_AMBULATORY_CARE_PROVIDER_SITE_OTHER): Payer: BLUE CROSS/BLUE SHIELD | Admitting: Family Medicine

## 2017-01-19 DIAGNOSIS — D509 Iron deficiency anemia, unspecified: Secondary | ICD-10-CM

## 2017-01-19 DIAGNOSIS — F0781 Postconcussional syndrome: Secondary | ICD-10-CM

## 2017-01-19 NOTE — Assessment & Plan Note (Signed)
Patient is still having difficulty with postconcussive syndrome. Patient is had difficulty with iron deficiency previously. Patient will start with supplementation. Patient will decrease any difficulty with inflammation. We will see if patient does conservative therapy. Keep patient on a work for another 2 weeks. Patient will follow-up with me again at that time and hopefully we'll be able to get her back to work.

## 2017-01-19 NOTE — Patient Instructions (Signed)
Good to see you I do think you have a concussion.   To help improve COGNITIVE function: Using fish oil/omega 3 that is 1000 mg (or roughly 600 mg EPA/DHA), starting as soon as possible after concussion, take: 3 tabs TWICE DAILY  for the next 3 days, then 3 tabs ONCE DAILY  for the next 10 days    To help reduce HEADACHES: Vitamin D 2000 IU daily  Turmeric 500mg  daily  Iron 65mg  with 500mg  of vitamin C daily  May stop after headaches are resolved.                                                                                             To help with INSOMNIA: Melatonin 3-5mg  AT BEDTIME   I want to see you again in 2 weeks

## 2017-01-21 ENCOUNTER — Encounter: Payer: BLUE CROSS/BLUE SHIELD | Admitting: Obstetrics and Gynecology

## 2017-01-26 ENCOUNTER — Encounter: Payer: Self-pay | Admitting: Obstetrics and Gynecology

## 2017-01-26 ENCOUNTER — Ambulatory Visit (INDEPENDENT_AMBULATORY_CARE_PROVIDER_SITE_OTHER): Payer: BLUE CROSS/BLUE SHIELD | Admitting: Obstetrics and Gynecology

## 2017-01-26 VITALS — BP 120/78 | HR 98 | Ht 65.0 in | Wt 184.0 lb

## 2017-01-26 DIAGNOSIS — B9689 Other specified bacterial agents as the cause of diseases classified elsewhere: Secondary | ICD-10-CM

## 2017-01-26 DIAGNOSIS — N760 Acute vaginitis: Secondary | ICD-10-CM

## 2017-01-26 DIAGNOSIS — Z79890 Hormone replacement therapy (postmenopausal): Secondary | ICD-10-CM | POA: Diagnosis not present

## 2017-01-26 LAB — POCT WET PREP WITH KOH
Clue Cells Wet Prep HPF POC: POSITIVE
KOH PREP POC: POSITIVE — AB
TRICHOMONAS UA: NEGATIVE
YEAST WET PREP PER HPF POC: NEGATIVE

## 2017-01-26 MED ORDER — METRONIDAZOLE 500 MG PO TABS: 500.0000 mg | ORAL_TABLET | Freq: Two times a day (BID) | ORAL | 0 refills | Status: DC

## 2017-01-26 MED ORDER — ESTRADIOL 0.05 MG/24HR TD PTWK: 0.0500 mg | MEDICATED_PATCH | TRANSDERMAL | 0 refills | Status: DC

## 2017-01-26 NOTE — Progress Notes (Signed)
Chief Complaint  Patient presents with  . bv    HPI:      [name] is a 51 y.o. G0P0000 who LMP was Patient's last menstrual period was 09/23/2016 (exact date)., presents today for BV sx of increased d/c and odor without irritation for 3 days. Sx were so bad she douched over the wknd with some relief. She was treated for BV with flagyl BID for 7 days 3/18 with sx relief. She is s/p hyst since then and adjusting hormone levels with meds.  She is sex active, no new partners.    Patient Active Problem List   Diagnosis Date Noted  . Bacterial vaginosis 01/26/2017  . Hot flashes 01/14/2017  . Menopause 01/14/2017  . Postconcussive syndrome 01/02/2017  . Post-operative state 10/10/2016  . Hypothyroidism 02/07/2016  . Well woman exam with routine gynecological exam 02/07/2016  . High risk heterosexual behavior 01/23/2015    Family History  Problem Relation Age of Onset  . Diabetes Mother   . Hypothyroidism Mother   . Diabetes Maternal Uncle   . Dementia Maternal Grandmother   . Stroke Maternal Grandmother   . Diabetes Maternal Grandfather   . Alzheimer's disease Paternal Grandmother   . Hypothyroidism Son     Social History   Social History  . Marital status: Divorced    Spouse name: N/A  . Number of children: N/A  . Years of education: N/A   Occupational History  . Not on file.   Social History Main Topics  . Smoking status: Never Smoker  . Smokeless tobacco: Never Used  . Alcohol use 0.0 oz/week     Comment: Occasional   . Drug use: No  . Sexual activity: Yes    Partners: Male    Birth control/ protection: Surgical     Comment: Multiple    Other Topics Concern  . 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  amitriptyline (ELAVIL) 10 MG tablet, Take 10 mg by mouth at bedtime., Disp: , Rfl:  0 .  BLACK COHOSH COMPOUND PO, Take 1 tablet by mouth daily., Disp: , Rfl:  .  Cholecalciferol (VITAMIN D-3) 5000 units TABS, Take 5,000 Units by mouth daily with supper., Disp: , Rfl:  .  cyanocobalamin 1000 MCG tablet, Take 1,000 mcg by mouth daily., Disp: , Rfl:  .  cyclobenzaprine (FLEXERIL) 10 MG tablet, Take 1 tablet (10 mg total) by mouth 3 (three) times daily as needed for muscle spasms., Disp: 30 tablet, Rfl: 0 .  estradiol (CLIMARA - DOSED IN MG/24 HR) 0.05 mg/24hr patch, Place 1 patch (0.05 mg total) onto the skin once a week., Disp: 4 patch, Rfl: 0 .  etodolac (LODINE) 400 MG tablet, Take 400 mg by mouth 2 (two) times daily., Disp: , Rfl:  .  ibuprofen (ADVIL,MOTRIN) 200 MG tablet, Take 3 tablets (600 mg total) by mouth every 6 (six) hours as needed for mild pain (for pain/headache.)., Disp: 30 tablet, Rfl: 0 .  levothyroxine (SYNTHROID, LEVOTHROID) 100 MCG tablet, Take 100 mcg by mouth daily before breakfast., Disp: , Rfl:  .  Red Yeast Rice Extract (RED YEAST RICE PO), Take 1 tablet by mouth daily., Disp: , Rfl:  .  metroNIDAZOLE (FLAGYL) 500 MG tablet, Take 1 tablet (500 mg  total) by mouth 2 (two) times daily., Disp: 14 tablet, Rfl: 0  Review of Systems  Constitutional: Negative for fever.  Gastrointestinal: Negative for blood in stool, constipation, diarrhea, nausea and vomiting.  Genitourinary: Positive for vaginal discharge. Negative for dyspareunia, dysuria, flank pain, frequency, hematuria, urgency, vaginal bleeding and vaginal pain.  Musculoskeletal: Negative for back pain.  Skin: Negative for rash.     OBJECTIVE:   Vitals:  BP 120/78   Pulse 98   Ht 5\' 5"  (1.651 m)   Wt 184 lb (83.5 kg)   LMP 09/23/2016 (Exact Date)   BMI 30.62 kg/m   Physical Exam  Constitutional: She is oriented to person, place, and time and well-developed, well-nourished, and in no distress. Vital signs are normal.  Genitourinary: Cervix normal, right adnexa normal, left adnexa normal and  vulva normal. Cervix exhibits no motion tenderness and no tenderness. Right adnexum displays no mass and no tenderness. Left adnexum displays no mass and no tenderness. Vulva exhibits no erythema, no exudate, no lesion, no rash and no tenderness. Vagina exhibits no lesion. Thin  white and vaginal discharge found.  Genitourinary Comments: UTERUS SURG REM  Neurological: She is oriented to person, place, and time.  Vitals reviewed.   Results: Results for orders placed or performed in visit on 01/26/17 (from the past 24 hour(s))  POCT Wet Prep with KOH     Status: Abnormal   Collection Time: 01/26/17  3:03 PM  Result Value Ref Range   Trichomonas, UA Negative    Clue Cells Wet Prep HPF POC pos    Epithelial Wet Prep HPF POC  Few, Moderate, Many, Too numerous to count   Yeast Wet Prep HPF POC neg    Bacteria Wet Prep HPF POC  Few   RBC Wet Prep HPF POC     WBC Wet Prep HPF POC     KOH Prep POC Positive (A) Negative     Assessment/Plan: Bacterial vaginosis - Pos wet prep. Rx RF flagyl. Will RF if sx recur. Add probiotics. No douching. - Plan: POCT Wet Prep with KOH, metroNIDAZOLE (FLAGYL) 500 MG tablet  Hormone replacement therapy (HRT) - Rx RF estradiol patch until pt start bioidentical HRT in a month. - Plan: estradiol (CLIMARA - DOSED IN MG/24 HR) 0.05 mg/24hr patch     Return if symptoms worsen or fail to improve.  Stanisha Lorenz B. Shallen Luedke, PA-C 01/26/2017 3:04 PM

## 2017-01-30 ENCOUNTER — Encounter: Payer: Self-pay | Admitting: Obstetrics and Gynecology

## 2017-01-30 ENCOUNTER — Other Ambulatory Visit: Payer: Self-pay | Admitting: Obstetrics and Gynecology

## 2017-01-30 MED ORDER — CLINDAMYCIN PHOSPHATE 2 % VA CREA: 1.0000 | TOPICAL_CREAM | Freq: Every day | VAGINAL | 0 refills | Status: DC

## 2017-01-30 NOTE — Progress Notes (Signed)
Rx change due to allergy to flagyl.

## 2017-02-01 NOTE — Progress Notes (Signed)
Subjective:   I, Jacqualin Combes, am serving as a scribe for Dr. Hulan Saas.   Chief Complaint: Bethany Bailey, DOB: 08-27-1965, is a 51 y.o. female who presents for follow up for a head injury sustained on 12/15/2016. She has been out of work since her last appointment. She has complain of feeling sensitive to motion still. She is seeing an optometrist tomorrow to correct her vision. Patient also states that last week she began taking the medication Flagil and had to discontinue it as it made her dizzy and nauseous.    Injury date : 12/15/2016 Visit #: 2  History of Present Illness:   Patient's goals/priorities: Return to baseline  Concussion Self-Reported Symptom Score Symptoms rated on a scale 1-6, in last 24 hours  Headache: 0  Nausea: 1  Vomiting: 0  Balance Difficulty: 1   Dizziness: 1  Fatigue: 1  Trouble Falling Asleep: 0   Sleep More Than Usual: 0  Sleep Less Than Usual: 0  Daytime Drowsiness:1  Photophobia: 0  Phonophobia: 0  Irritability: 0  Sadness: 1  Nervousness: 0  Feeling More Emotional: 0  Numbness or Tingling: 0  Feeling Slowed Down: 0  Feeling Mentally Foggy: 0  Difficulty Concentrating: 1  Difficulty Remembering: 1  Visual Problems: 1  Neck Pain: 1  Tinnitus: 0   Total Symptom Score: 9 Previous Symptom Score: 68  Review of Systems: Pertinent items are noted in HPI.  Review of History: Past Medical History: @[facility] @  Past Surgical History:  has a past surgical history that includes Tubal ligation (1999); Novasure ablation (2012); Cervical cone biopsy (1990); and Abdominal hysterectomy (Bilateral, 10/10/2016). Family History: family history includes Alzheimer's disease in her paternal grandmother; Dementia in her maternal grandmother; Diabetes in her maternal grandfather, maternal uncle, and mother; Hypothyroidism in her mother and son; Stroke in her maternal grandmother. Social History:  reports that she has never smoked. She has never used smokeless  tobacco. She reports that she drinks alcohol. She reports that she does not use drugs. Current Medications: has a current medication list which includes the following prescription(s): amitriptyline, black cohosh, vitamin d-3, clindamycin, cyanocobalamin, cyclobenzaprine, estradiol, etodolac, ibuprofen, levothyroxine, and red yeast rice extract. Allergies: is allergic to aspirin; erythromycin; food; metronidazole; and sulfa antibiotics.  Objective:    Physical Examination Vitals:   02/02/17 1240  BP: 110/88  Pulse: 78   General appearance: alert, appears stated age and cooperative Head: Normocephalic, without obvious abnormality, atraumatic Eyes: conjunctivae/corneas clear. PERRL, EOM's intact. Fundi benign. Sclera anicteric.Mild nystagmus noted. Lungs: clear to auscultation bilaterally and percussion Heart: regular rate and rhythm, S1, S2 normal, no murmur, click, rub or gallop Neurologic: CN 2-12 normal.  Sensation to pain, touch, and proprioception normal.  DTRs  normal in upper and lower extremities. No pathologic reflexes. Neg rhomberg, modified rhomberg, pronator drift, tandem gait, finger-to-nose; see post-concussion vestibular and oculomotor testing in chart Psychiatric: Oriented X3, intact recent and remote memory, judgement and insight, normal mood and affect  Concussion testing performed today:Past serial sevens. Did not with balancing. Still some very mild vestibular neuro still noted.   Assessment:    No diagnosis found.  DEOSHA WERDEN presents with the following concussion subtypes. [] Cognitive [] Cervical [] Vestibular [] Ocular [] Migraine [] Anxiety/Mood

## 2017-02-02 ENCOUNTER — Ambulatory Visit (INDEPENDENT_AMBULATORY_CARE_PROVIDER_SITE_OTHER): Payer: BLUE CROSS/BLUE SHIELD | Admitting: Family Medicine

## 2017-02-02 ENCOUNTER — Encounter: Payer: Self-pay | Admitting: Family Medicine

## 2017-02-02 DIAGNOSIS — F0781 Postconcussional syndrome: Secondary | ICD-10-CM | POA: Diagnosis not present

## 2017-02-02 NOTE — Patient Instructions (Addendum)
Good to see you  You are doing great  Continue with the vitamins OK to increase activity as tolerated  Will get you back to work slowly See me again in 2 weeks to fully clear you

## 2017-02-02 NOTE — Assessment & Plan Note (Signed)
Doing much better overall. Discussed with patient at great length. No change in the medications at this time. We'll start patient to slowly get back to work. We'll start part-time for one week than full-time for a week and follow-up again in 2 weeks.

## 2017-02-02 NOTE — Progress Notes (Signed)
To work

## 2017-02-13 ENCOUNTER — Telehealth: Payer: Self-pay | Admitting: Obstetrics & Gynecology

## 2017-02-13 NOTE — Telephone Encounter (Signed)
[facility] states they faxed a letter to [facility] to approve patients bioidentical HRT therapy regimen. There is a pharmacist there working closely with the patient to monitor these medications. Please notify pharmacy if letter has not been received. Thank you.

## 2017-02-13 NOTE — Telephone Encounter (Signed)
It has, I called, and left message for Kristeen Miss to call me back, which they said would be next week

## 2017-02-13 NOTE — Telephone Encounter (Signed)
Harmon Pier is calling about Patient prescription regiment for her Hormone Replacement should be please call pharmacy back.

## 2017-02-16 ENCOUNTER — Encounter: Payer: Self-pay | Admitting: Obstetrics & Gynecology

## 2017-02-17 ENCOUNTER — Ambulatory Visit (INDEPENDENT_AMBULATORY_CARE_PROVIDER_SITE_OTHER): Payer: BLUE CROSS/BLUE SHIELD | Admitting: Family Medicine

## 2017-02-17 ENCOUNTER — Encounter: Payer: Self-pay | Admitting: Family Medicine

## 2017-02-17 ENCOUNTER — Other Ambulatory Visit: Payer: Self-pay | Admitting: Obstetrics & Gynecology

## 2017-02-17 DIAGNOSIS — F0781 Postconcussional syndrome: Secondary | ICD-10-CM

## 2017-02-17 MED ORDER — DHEA 10 MG PO CAPS
10.0000 mg | ORAL_CAPSULE | Freq: Every day | ORAL | 1 refills | Status: DC
Start: 2017-02-17 — End: 2018-04-29

## 2017-02-17 MED ORDER — PROGESTERONE 100 MG VA SUPP: 100.0000 mg | Freq: Every day | VAGINAL | 1 refills | Status: DC

## 2017-02-17 NOTE — Patient Instructions (Signed)
Good to see you  I am so happy! The vitamin D is most important and fish oil 2nd.  You will do great  Call me if you need me!

## 2017-02-17 NOTE — Progress Notes (Signed)
Subjective:   I, Jacqualin Combes, am serving as a scribe for Dr. Hulan Saas.   Chief Complaint: Bethany Bailey, DOB: 1966-06-25, is a 51 y.o. female who presents for follow up for a head injury sustained on 12/15/2016. Doing significantly better. Patient is now back to full duty. Patient is not having any difficulty and no symptoms.   Injury date : 12/15/2016 Visit #: 3  History of Present Illness:   Patient's goals/priorities: Return to baseline  Concussion Self-Reported Symptom Score Symptoms rated on a scale 1-6, in last 24 hours  Headache: 0  Nausea: 0  Vomiting: 0  Balance Difficulty: 0  Dizziness: 0  Fatigue: 1  Trouble Falling Asleep: 0   Sleep More Than Usual: 0  Sleep Less Than Usual: 0  Daytime Drowsiness:0  Photophobia: 0  Phonophobia: 0  Irritability: 0  Sadness: 1  Nervousness: 0  Feeling More Emotional: 0  Numbness or Tingling: 0  Feeling Slowed Down: 0  Feeling Mentally Foggy: 0  Difficulty Concentrating: 0  Difficulty Remembering: 0  Visual Problems: 0  Neck Pain: 1  Tinnitus: 0   Total Symptom Score: 3 Previous Symptom Score:9  Review of Systems: Pertinent items are noted in HPI.  Review of History: Past Medical History: Past Medical History:  Diagnosis Date  . GERD (gastroesophageal reflux disease)   . Hypothyroid      Past Surgical History:  has a past surgical history that includes Tubal ligation (1999); Novasure ablation (2012); Cervical cone biopsy (1990); and Abdominal hysterectomy (Bilateral, 10/10/2016). Family History: family history includes Alzheimer's disease in her paternal grandmother; Dementia in her maternal grandmother; Diabetes in her maternal grandfather, maternal uncle, and mother; Hypothyroidism in her mother and son; Stroke in her maternal grandmother. Social History:  reports that she has never smoked. She has never used smokeless tobacco. She reports that she drinks alcohol. She reports that she does not use drugs. Current  Medications: has a current medication list which includes the following prescription(s): amitriptyline, black cohosh, vitamin d-3, clindamycin, cyanocobalamin, cyclobenzaprine, estradiol, etodolac, ibuprofen, levothyroxine, and red yeast rice extract. Allergies: is allergic to aspirin; erythromycin; food; metronidazole; and sulfa antibiotics.  Objective:    Physical Examination Vitals:   02/17/17 1348  BP: 132/80  Pulse: 84   General appearance: alert, appears stated age and cooperative Head: Normocephalic, without obvious abnormality, atraumatic Eyes: conjunctivae/corneas clear. PERRL, EOM's intact. Fundi benign. Sclera anicteric.Mild nystagmus noted. Lungs: clear to auscultation bilaterally and percussion Heart: regular rate and rhythm, S1, S2 normal, no murmur, click, rub or gallop Neurologic: CN 2-12 normal.  Sensation to pain, touch, and proprioception normal.  DTRs  normal in upper and lower extremities. No pathologic reflexes. Neg rhomberg, modified rhomberg, pronator drift, tandem gait, finger-to-nose; see post-concussion vestibular and oculomotor testing in chart Psychiatric: Oriented X3, intact recent and remote memory, judgement and insight, normal mood and affect    Assessment:

## 2017-02-17 NOTE — Progress Notes (Signed)
n

## 2017-02-17 NOTE — Assessment & Plan Note (Signed)
Patient has completely resolved at this time. Encourage her to continue the vitamin D. His lungs patient is what she can follow-up as needed. Return to work with no restrictions.

## 2017-03-03 ENCOUNTER — Encounter: Payer: Self-pay | Admitting: Family Medicine

## 2017-03-11 DIAGNOSIS — I8311 Varicose veins of right lower extremity with inflammation: Secondary | ICD-10-CM | POA: Diagnosis not present

## 2017-03-11 DIAGNOSIS — I8312 Varicose veins of left lower extremity with inflammation: Secondary | ICD-10-CM | POA: Diagnosis not present

## 2017-03-18 ENCOUNTER — Encounter: Payer: Self-pay | Admitting: Obstetrics & Gynecology

## 2017-03-18 DIAGNOSIS — I8312 Varicose veins of left lower extremity with inflammation: Secondary | ICD-10-CM | POA: Diagnosis not present

## 2017-03-18 DIAGNOSIS — I8311 Varicose veins of right lower extremity with inflammation: Secondary | ICD-10-CM | POA: Diagnosis not present

## 2017-03-19 NOTE — Telephone Encounter (Signed)
Please contact pharmacy and Amy Aaron Edelman and see what their request is for this month.  I have no papers or messages from them.

## 2017-04-15 DIAGNOSIS — M79604 Pain in right leg: Secondary | ICD-10-CM | POA: Diagnosis not present

## 2017-04-15 DIAGNOSIS — M79605 Pain in left leg: Secondary | ICD-10-CM | POA: Diagnosis not present

## 2017-06-02 ENCOUNTER — Other Ambulatory Visit: Payer: BLUE CROSS/BLUE SHIELD

## 2017-06-03 ENCOUNTER — Other Ambulatory Visit: Payer: BLUE CROSS/BLUE SHIELD

## 2017-06-03 DIAGNOSIS — E039 Hypothyroidism, unspecified: Secondary | ICD-10-CM

## 2017-06-05 LAB — TSH: TSH: 2.09 u[IU]/mL (ref 0.450–4.500)

## 2017-06-09 DIAGNOSIS — R6882 Decreased libido: Secondary | ICD-10-CM | POA: Diagnosis not present

## 2017-06-09 DIAGNOSIS — E039 Hypothyroidism, unspecified: Secondary | ICD-10-CM | POA: Diagnosis not present

## 2017-09-07 ENCOUNTER — Encounter: Payer: Self-pay | Admitting: Family Medicine

## 2017-09-11 NOTE — Progress Notes (Signed)
[name] [address] [place], [place] [address] Phone: [phone] Subjective:      CC: Headache follow-up  [id number]  Bethany Bailey is a 52 y.o. female coming in with complaint of headache.  Patient was seen greater than 7 months ago for postconcussive syndrome.  Patient was last seen in August 14 and at that time was released to full duty and feeling significantly better.  Initial head injury was sustained on December 15, 2016. Patient has been having a hard time recalling and remembering things since the injury. She denies having any problems before the head injury. List of specific incidences is in Media tab. Memory issues span both work and home life activities. Patient also expresses irritability and difficulties sleeping. She notes that she is going through hormone therapy. She does continue to take turmeric, fish oil and vitamin d.  Patient is concerned that this could be secondary to the accident is wondering what else can be possibly done.    Past Medical History:  Diagnosis Date  . GERD (gastroesophageal reflux disease)   . Hypothyroid    Past Surgical History:  Procedure Laterality Date  . ABDOMINAL HYSTERECTOMY Bilateral 10/10/2016   Procedure: HYSTERECTOMY ABDOMINAL WITH BILATERAL SALPINGO OOPHERECTOMY;  Surgeon: Harlin Heys, MD;  Location: [facility] ORS;  Service: Gynecology;  Laterality: Bilateral;  . CERVICAL CONE BIOPSY  1990  . [place]  2012  . TUBAL LIGATION  1999   Social History   Socioeconomic History  . Marital status: Divorced    Spouse name: Not on file  . Number of children: Not on file  . Years of education: Not on file  . Highest education level: Not on file  Social Needs  . Financial resource strain: Not on file  . Food insecurity - worry: Not on file  . Food insecurity - inability: Not on file  . Transportation needs - medical: Not on file  . Transportation needs - non-medical: Not on file    Occupational History  . Not on file  Tobacco Use  . Smoking status: Never Smoker  . Smokeless tobacco: Never Used  Substance and Sexual Activity  . Alcohol use: Yes    Alcohol/week: 0.0 oz    Comment: Occasional   . Drug use: No  . Sexual activity: Yes    Partners: Male    Birth control/protection: Surgical    Comment: Multiple   Other Topics Concern  . 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 Aspirin Other (See Comments)    Ears ring  . Erythromycin Nausea And Vomiting  . Food     Wheat-coughing/sneezing & joint pain.  . Metronidazole Nausea Only    Paresthesias, dizziness  . Sulfa Antibiotics Rash   Family History  Problem Relation Age of Onset  . Diabetes Mother   . Hypothyroidism Mother   . Diabetes Maternal Uncle   . Dementia Maternal Grandmother   . Stroke Maternal Grandmother   . Diabetes Maternal Grandfather   . Alzheimer's disease Paternal Grandmother   . Hypothyroidism Son      Past medical history, social, surgical and family history all reviewed in electronic medical record.  No pertanent information unless stated regarding to the chief complaint.   Review of Systems:Review of systems  updated and as accurate as of 09/14/17  No  visual changes, nausea, vomiting, diarrhea, constipation, dizziness, abdominal pain, skin rash, fevers, chills, night sweats, weight loss, swollen lymph nodes, body aches, joint swelling, muscle aches, chest pain, shortness of breath, mood changes.  Mild positive headaches  Objective  Blood pressure 132/88, pulse 80, height 5' 5.5" (1.664 m), last menstrual period 09/23/2016, SpO2 98 %. Systems examined below as of 09/14/17   General: No apparent distress alert and oriented x3 mood and affect normal, dressed appropriately.  HEENT: Pupils equal, extraocular movements intact   Respiratory: Patient's speak in full sentences and does not appear short of breath  Cardiovascular: No lower extremity edema, non tender, no erythema  Skin: Warm dry intact with no signs of infection or rash on extremities or on axial skeleton.  Abdomen: Soft nontender  Neuro: Cranial nerves II through XII are intact, neurovascularly intact in all extremities with 2+ DTRs and 2+ pulses.  Lymph: No lymphadenopathy of posterior or anterior cervical chain or axillae bilaterally.  Gait normal with good balance and coordination.  MSK:  Non tender with full range of motion and good stability and symmetric strength and tone of shoulders, elbows, wrist, hip, knee and ankles bilaterally.  Neck: Inspection unremarkable. No palpable stepoffs. Negative Spurling's maneuver. Full neck range of motion Grip strength and sensation normal in bilateral hands Strength good C4 to T1 distribution No sensory change to C4 to T1 Negative Hoffman sign bilaterally Reflexes normal    Impression and Recommendations:     This case required medical decision making of moderate complexity.      Note: This dictation was prepared with Dragon dictation along with smaller phrase technology. Any transcriptional errors that result from this process are unintentional.

## 2017-09-14 ENCOUNTER — Ambulatory Visit: Payer: BLUE CROSS/BLUE SHIELD | Admitting: Family Medicine

## 2017-09-14 ENCOUNTER — Other Ambulatory Visit (INDEPENDENT_AMBULATORY_CARE_PROVIDER_SITE_OTHER): Payer: BLUE CROSS/BLUE SHIELD

## 2017-09-14 ENCOUNTER — Encounter: Payer: Self-pay | Admitting: Family Medicine

## 2017-09-14 VITALS — BP 132/88 | HR 80 | Ht 65.5 in

## 2017-09-14 DIAGNOSIS — M2550 Pain in unspecified joint: Secondary | ICD-10-CM | POA: Diagnosis not present

## 2017-09-14 DIAGNOSIS — R4189 Other symptoms and signs involving cognitive functions and awareness: Secondary | ICD-10-CM

## 2017-09-14 LAB — COMPREHENSIVE METABOLIC PANEL
ALBUMIN: 4.4 g/dL (ref 3.5–5.2)
ALT: 30 U/L (ref 0–35)
AST: 18 U/L (ref 0–37)
Alkaline Phosphatase: 44 U/L (ref 39–117)
BUN: 20 mg/dL (ref 6–23)
CHLORIDE: 105 meq/L (ref 96–112)
CO2: 26 meq/L (ref 19–32)
CREATININE: 0.68 mg/dL (ref 0.40–1.20)
Calcium: 9.9 mg/dL (ref 8.4–10.5)
GFR: 97.31 mL/min (ref >60.00)
Glucose, Bld: 94 mg/dL (ref 70–99)
Potassium: 3.9 mEq/L (ref 3.5–5.1)
SODIUM: 140 meq/L (ref 135–145)
Total Bilirubin: 0.3 mg/dL (ref 0.2–1.2)
Total Protein: 7.6 g/dL (ref 6.0–8.3)

## 2017-09-14 LAB — CBC WITH DIFFERENTIAL/PLATELET
BASOS PCT: 0.8 % (ref 0.0–3.0)
Basophils Absolute: 0.1 10*3/uL (ref 0.0–0.1)
EOS ABS: 0.1 10*3/uL (ref 0.0–0.7)
Eosinophils Relative: 0.8 % (ref 0.0–5.0)
HCT: 40.3 % (ref 36.0–46.0)
Hemoglobin: 13.7 g/dL (ref 12.0–15.0)
LYMPHS ABS: 2.8 10*3/uL (ref 0.7–4.0)
Lymphocytes Relative: 30.1 % (ref 12.0–46.0)
MCHC: 34 g/dL (ref 30.0–36.0)
MCV: 87.9 fl (ref 78.0–100.0)
MONO ABS: 0.6 10*3/uL (ref 0.1–1.0)
Monocytes Relative: 6.8 % (ref 3.0–12.0)
NEUTROS ABS: 5.7 10*3/uL (ref 1.4–7.7)
NEUTROS PCT: 61.5 % (ref 43.0–77.0)
Platelets: 251 10*3/uL (ref 150.0–400.0)
RBC: 4.59 Mil/uL (ref 3.87–5.11)
RDW: 13.5 % (ref 11.5–15.5)
WBC: 9.3 10*3/uL (ref 4.0–10.5)

## 2017-09-14 LAB — SEDIMENTATION RATE: Sed Rate: 12 mm/hr (ref 0–30)

## 2017-09-14 LAB — URIC ACID: URIC ACID, SERUM: 4.8 mg/dL (ref 2.4–7.0)

## 2017-09-14 LAB — C-REACTIVE PROTEIN: CRP: 0.3 mg/dL — ABNORMAL LOW (ref 0.5–20.0)

## 2017-09-14 LAB — T3, FREE: T3, Free: 2.8 pg/mL (ref 2.3–4.2)

## 2017-09-14 LAB — TSH: TSH: 2.45 u[IU]/mL (ref 0.35–4.50)

## 2017-09-14 LAB — IBC PANEL
Iron: 71 ug/dL (ref 42–145)
Saturation Ratios: 15.6 % — ABNORMAL LOW (ref 20.0–50.0)
Transferrin: 326 mg/dL (ref 212.0–360.0)

## 2017-09-14 LAB — TESTOSTERONE: TESTOSTERONE: 55.01 ng/dL — AB (ref 15.00–40.00)

## 2017-09-14 NOTE — Patient Instructions (Signed)
I am sorry you are not feeling well  We will get to the bottom of it.  Labs downstairs today  In the meantime.  B12 1045mcg daily  B6 200mg  daily  Choline 500mg  daily  Continue the vitamin D I will write you in my chart on the labs See me again in 2-3 weeks

## 2017-09-14 NOTE — Assessment & Plan Note (Signed)
Patient is having some difficulty with her cognitive change.  I do not believe that this is still secondary to a postconcussive syndrome at this time.  Patient was making significant progress previously and now having some worsening symptoms again.  Patient did undergo a hysterectomy one year ago.  Discussed other medications over-the-counter that she is taking at this time.  Patient has not had laboratory workup significantly and I do feel such things folate level, B12, thyroid with her history could be beneficial.  Patient is also on a low-dose of amitriptyline that may need to be discontinued.  Patient is in agreement with the plan and will get laboratory workup first.

## 2017-09-15 LAB — FOLATE: Folate: 11.1 ng/mL (ref >5.9)

## 2017-09-15 LAB — T4, FREE: Free T4: 0.93 ng/dL (ref 0.60–1.60)

## 2017-09-15 LAB — VITAMIN B12: Vitamin B-12: 1018 pg/mL — ABNORMAL HIGH (ref 211–911)

## 2017-09-15 LAB — VITAMIN D 25 HYDROXY (VIT D DEFICIENCY, FRACTURES): VITD: 54.97 ng/mL (ref 30.00–100.00)

## 2017-09-16 LAB — CYCLIC CITRUL PEPTIDE ANTIBODY, IGG

## 2017-09-16 LAB — PTH, INTACT AND CALCIUM
Calcium: 9.8 mg/dL (ref 8.6–10.4)
PTH: 34 pg/mL (ref 14–64)

## 2017-09-16 LAB — CALCIUM, IONIZED: CALCIUM ION: 5.3 mg/dL (ref 4.8–5.6)

## 2017-09-16 LAB — ANA: ANA: NEGATIVE

## 2017-09-16 LAB — ANGIOTENSIN CONVERTING ENZYME: ANGIOTENSIN-CONVERTING ENZYME: 40 U/L (ref 9–67)

## 2017-09-16 LAB — RHEUMATOID FACTOR: Rhuematoid fact SerPl-aCnc: <14 IU/mL (ref <14)

## 2017-09-23 ENCOUNTER — Ambulatory Visit: Payer: Self-pay | Admitting: Family Medicine

## 2017-09-27 NOTE — Progress Notes (Signed)
[name] [place], [place] [address] Phone: [phone] Subjective:     CC: Headache follow-up  [id number]  Bethany Bailey is a 52 y.o. female coming in with complaint of headache and neck pain.  Patient was seen previously after a postconcussive syndrome.  Continues to have neck pain and headaches.  Was having cognitive changes as well.  Patient was to take different vitamin supplementations.  Laboratory workup was fairly unremarkable except for minor elevation in testosterone.  Patient states that she is doing better. She has been one the recommended mediations for one choline and b6. She feels that she is remembering things better and does not feel as anxious. Denies any return of headaches.      Past Medical History:  Diagnosis Date  . GERD (gastroesophageal reflux disease)   . Hypothyroid    Past Surgical History:  Procedure Laterality Date  . ABDOMINAL HYSTERECTOMY Bilateral 10/10/2016   Procedure: HYSTERECTOMY ABDOMINAL WITH BILATERAL SALPINGO OOPHERECTOMY;  Surgeon: Harlin Heys, MD;  Location: [facility] ORS;  Service: Gynecology;  Laterality: Bilateral;  . CERVICAL CONE BIOPSY  1990  . [place]  2012  . TUBAL LIGATION  1999   Social History   Socioeconomic History  . Marital status: Divorced    Spouse name: Not on file  . Number of children: Not on file  . Years of education: Not on file  . Highest education level: Not on file  Occupational History  . Not on file  Social Needs  . Financial resource strain: Not on file  . Food insecurity:    Worry: Not on file    Inability: Not on file  . Transportation needs:    Medical: Not on file    Non-medical: Not on file  Tobacco Use  . Smoking status: Never Smoker  . Smokeless tobacco: Never Used  Substance and Sexual Activity  . Alcohol use: Yes    Alcohol/week: 0.0 oz    Comment: Occasional   . Drug use: No  . Sexual activity: Yes    Partners: Male   Birth control/protection: Surgical    Comment: Multiple   Lifestyle  . Physical activity:    Days per week: Not on file    Minutes per session: Not on file  . Stress: Not on file  Relationships  . Social connections:    Talks on phone: Not on file    Gets together: Not on file    Attends religious service: Not on file    Active member of club or organization: Not on file    Attends meetings of clubs or organizations: Not on file    Relationship status: Not on file  Other Topics Concern  . 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 Aspirin Other (See Comments)    Ears ring  . Erythromycin Nausea And Vomiting  . Food     Wheat-coughing/sneezing & joint pain.  . Metronidazole Nausea Only    Paresthesias, dizziness  . Sulfa Antibiotics Rash   Family History  Problem Relation Age of Onset  . Diabetes Mother   . Hypothyroidism Mother   . Diabetes Maternal Uncle   . Dementia Maternal Grandmother   . Stroke Maternal Grandmother   . Diabetes Maternal Grandfather   .  Alzheimer's disease Paternal Grandmother   . Hypothyroidism Son      Past medical history, social, surgical and family history all reviewed in electronic medical record.  No pertanent information unless stated regarding to the chief complaint.   Review of Systems:Review of systems updated and as accurate as of 09/28/17  No , visual changes, nausea, vomiting, diarrhea, constipation, dizziness, abdominal pain, skin rash, fevers, chills, night sweats, weight loss, swollen lymph nodes, body aches, joint swelling, muscle aches, chest pain, shortness of breath, mood changes.  Still positive headaches but improving  Objective  Blood pressure 130/86, pulse 76, weight 192 lb (87.1 kg), last menstrual period 09/23/2016, SpO2 98 %. Systems examined below as of 09/28/17     General: No apparent distress alert and oriented x3 mood and affect normal, dressed appropriately.  HEENT: Pupils equal, extraocular movements intact  Respiratory: Patient's speak in full sentences and does not appear short of breath  Cardiovascular: No lower extremity edema, non tender, no erythema  Skin: Warm dry intact with no signs of infection or rash on extremities or on axial skeleton.  Abdomen: Soft nontender  Neuro: Cranial nerves II through XII are intact, neurovascularly intact in all extremities with 2+ DTRs and 2+ pulses.  Lymph: No lymphadenopathy of posterior or anterior cervical chain or axillae bilaterally.  Gait normal with good balance and coordination.  MSK:  Non tender with full range of motion and good stability and symmetric strength and tone of shoulders, elbows, wrist, hip, knee and ankles bilaterally.  Neck exam shows the patient still has some mild tightness with range of motion.  Negative Spurling's.  Mild tenderness in the paraspinal musculature of the neck.  Good grip strength. Patient to do very well with serial sevens.  Cognitive awareness and memory and recall.     Impression and Recommendations:     This case required medical decision making of moderate complexity.      Note: This dictation was prepared with Dragon dictation along with smaller phrase technology. Any transcriptional errors that result from this process are unintentional.

## 2017-09-28 ENCOUNTER — Encounter: Payer: Self-pay | Admitting: Family Medicine

## 2017-09-28 ENCOUNTER — Ambulatory Visit: Payer: BLUE CROSS/BLUE SHIELD | Admitting: Family Medicine

## 2017-09-28 DIAGNOSIS — R4189 Other symptoms and signs involving cognitive functions and awareness: Secondary | ICD-10-CM

## 2017-09-28 NOTE — Patient Instructions (Signed)
Good to see you  I am impressed.  Stay active.  Continue the vitamins at least another 2 weeks. Then when stopping stop one at a time for a week and see how you fell.  Honestly could do them forever if you want As long as you do well see me when you need me  [phone]

## 2017-09-28 NOTE — Assessment & Plan Note (Signed)
Patient seems to be doing better at this time.  I do think it is more multifactorial.  Do not feel that further workup is necessary at this time.  Laboratory workup was unremarkable.  Follow-up with me again as needed The patient continues to improve

## 2017-11-04 ENCOUNTER — Encounter: Payer: Self-pay | Admitting: Obstetrics & Gynecology

## 2017-11-05 NOTE — Telephone Encounter (Signed)
Please call in for me

## 2017-11-05 NOTE — Telephone Encounter (Signed)
Plz forward Rx for DHEA, Estradiol, and Progesterone to this new pharmacy (by phone, as there will be questions).  Let pt know we will assist in this changeover.

## 2017-11-05 NOTE — Telephone Encounter (Signed)
Let me know if I can help

## 2018-01-11 ENCOUNTER — Encounter: Payer: Self-pay | Admitting: Family Medicine

## 2018-01-21 DIAGNOSIS — G43109 Migraine with aura, not intractable, without status migrainosus: Secondary | ICD-10-CM | POA: Diagnosis not present

## 2018-02-01 NOTE — Progress Notes (Signed)
[name] [place], [place] [address] Phone: [phone] Subjective:      CC: Memory issues  [id number]  Bethany Bailey is a 52 y.o. female coming in with complaint of memory issues. She is still working at [employer] at [employer]. She is having trouble recalling things from May. 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 Has forgotten other things at work where she was reprimanded for her mistakes. No new position or responsibilities.  Outside of work is having troubles remembering if she paid the bills. Goes back to look and she had paid the bills.   Does note that she feels depressed due to stress at work and hormone changes that are being managed by another physician. Has lost her sense of taste and smell. Started 2-3 weeks ago.   Also had her uncle who committed suicide 10 days ago   Is taking choline, b vitamins and vitamin c.      Past Medical History:  Diagnosis Date  . GERD (gastroesophageal reflux disease)   . Hypothyroid    Past Surgical History:  Procedure Laterality Date  . ABDOMINAL HYSTERECTOMY Bilateral 10/10/2016   Procedure: HYSTERECTOMY ABDOMINAL WITH BILATERAL SALPINGO OOPHERECTOMY;  Surgeon: Harlin Heys, MD;  Location: [facility] ORS;  Service: Gynecology;  Laterality: Bilateral;  . CERVICAL CONE BIOPSY  1990  . [place]  2012  . TUBAL LIGATION  1999   Social History   Socioeconomic History  . Marital status: Divorced    Spouse name: Not on file  . Number of children: Not on file  . Years of education: Not on file  . Highest education level: Not on file  Occupational History  . Not on file  Social Needs  . Financial resource strain: Not on file  . Food insecurity:    Worry: Not on file    Inability: Not on file  . Transportation needs:    Medical: Not on file    Non-medical: Not on file  Tobacco Use  . Smoking status: Never Smoker  .  Smokeless tobacco: Never Used  Substance and Sexual Activity  . Alcohol use: Yes    Alcohol/week: 0.0 oz    Comment: Occasional   . Drug use: No  . Sexual activity: Yes    Partners: Male    Birth control/protection: Surgical    Comment: Multiple   Lifestyle  . Physical activity:    Days per week: Not on file    Minutes per session: Not on file  . Stress: Not on file  Relationships  . Social connections:    Talks on phone: Not on file    Gets together: Not on file    Attends religious service: Not on file    Active member of club or organization: Not on file    Attends meetings of clubs or organizations: Not on file    Relationship status: Not on file  Other Topics Concern  . 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 Aspirin Other (See Comments)    Ears ring  . Erythromycin Nausea And Vomiting  . Food     Wheat-coughing/sneezing & joint pain.  . Metronidazole Nausea Only  Paresthesias, dizziness  . Sulfa Antibiotics Rash   Family History  Problem Relation Age of Onset  . Diabetes Mother   . Hypothyroidism Mother   . Diabetes Maternal Uncle   . Dementia Maternal Grandmother   . Stroke Maternal Grandmother   . Diabetes Maternal Grandfather   . Alzheimer's disease Paternal Grandmother   . Hypothyroidism Son      Past medical history, social, surgical and family history all reviewed in electronic medical record.  No pertanent information unless stated regarding to the chief complaint.   Review of Systems:Review of systems updated and as accurate as of 02/03/18  No  visual changes, nausea, vomiting, diarrhea, constipation, dizziness, abdominal pain, skin rash, fevers, chills, night sweats, weight loss, swollen lymph nodes, body aches, joint swelling, muscle aches, chest pain, shortness of breath, mood changes.  Positive  headaches  Objective  Blood pressure 132/90, pulse 84, height 5' 5.5" (1.664 m), weight 192 lb (87.1 kg), last menstrual period 09/23/2016, SpO2 96 %. Systems examined below as of 02/03/18   General: No apparent distress alert and oriented x3 mood and affect normal, dressed appropriately.  HEENT: Pupils equal, extraocular movements intact  Respiratory: Patient's speak in full sentences and does not appear short of breath  Cardiovascular: No lower extremity edema, non tender, no erythema  Skin: Warm dry intact with no signs of infection or rash on extremities or on axial skeleton.  Abdomen: Soft nontender  Neuro: Cranial nerves II through XII are intact, neurovascularly intact in all extremities with 2+ DTRs and 2+ pulses.  Lymph: No lymphadenopathy of posterior or anterior cervical chain or axillae bilaterally.  Gait normal with good balance and coordination.  MSK:  Non tender with full range of motion and good stability and symmetric strength and tone of shoulders, elbows, wrist, hip, knee and ankles bilaterally.      Impression and Recommendations:     This case required medical decision making of moderate complexity.      Note: This dictation was prepared with Dragon dictation along with smaller phrase technology. Any transcriptional errors that result from this process are unintentional.

## 2018-02-03 ENCOUNTER — Ambulatory Visit: Payer: BLUE CROSS/BLUE SHIELD | Admitting: Family Medicine

## 2018-02-03 ENCOUNTER — Encounter

## 2018-02-03 ENCOUNTER — Encounter: Payer: Self-pay | Admitting: Family Medicine

## 2018-02-03 DIAGNOSIS — F32A Depression, unspecified: Secondary | ICD-10-CM | POA: Insufficient documentation

## 2018-02-03 DIAGNOSIS — F329 Major depressive disorder, single episode, unspecified: Secondary | ICD-10-CM

## 2018-02-03 MED ORDER — VENLAFAXINE HCL ER 37.5 MG PO CP24: 37.5000 mg | ORAL_CAPSULE | Freq: Every day | ORAL | 1 refills | Status: DC

## 2018-02-03 NOTE — Assessment & Plan Note (Signed)
Patient has had underlying anxiety and depression previously.  I believe that this is more of a reactive depression secondary to the loss of her uncle.  We discussed icing regimen and home exercises, we discussed which activities to do which wants to avoid.  Patient is increasing activity.  Effexor given.  Patient will follow-up with her therapist.  Follow-up with me in 2 to 3 weeks

## 2018-02-03 NOTE — Patient Instructions (Addendum)
Good to see you  I am sorry for your loss.  Effexor 37.5 mg daily  Please see a therapist and talk  Please send me a message in 1 week and tell me how you are doing See me again in 2-3 weeks

## 2018-02-09 ENCOUNTER — Encounter: Payer: Self-pay | Admitting: Family Medicine

## 2018-02-18 ENCOUNTER — Encounter: Payer: Self-pay | Admitting: Family Medicine

## 2018-03-02 DIAGNOSIS — E669 Obesity, unspecified: Secondary | ICD-10-CM | POA: Insufficient documentation

## 2018-03-02 DIAGNOSIS — M4317 Spondylolisthesis, lumbosacral region: Secondary | ICD-10-CM | POA: Insufficient documentation

## 2018-03-02 DIAGNOSIS — M7061 Trochanteric bursitis, right hip: Secondary | ICD-10-CM | POA: Diagnosis not present

## 2018-03-02 DIAGNOSIS — M5441 Lumbago with sciatica, right side: Secondary | ICD-10-CM | POA: Diagnosis not present

## 2018-03-02 DIAGNOSIS — M545 Low back pain: Secondary | ICD-10-CM | POA: Diagnosis not present

## 2018-03-03 NOTE — Progress Notes (Signed)
[facility] [place], [place] [address] Phone: [phone] Subjective:   Bethany Bailey, am serving as a scribe for Dr. Hulan Saas.   CC: Headache, depression follow-up  [id number]  Bethany Bailey is a 52 y.o. female coming in with complaint of symptoms with an anti-depressant. She did taper for a week and then she slept for 22 hours straight. She does still have difficulty with sleeping. Can sleep for a few hours and then wakes up. Felt depressed last week due to coming off of the medication. Has not been feeling as bad but does still feel depressed. Has a lot of stress at her work. Is looking for another job.  Patient was unable to tolerate the Effexor.  Patient is looking for something else.  Still having significant stress.     Past Medical History:  Diagnosis Date  . GERD (gastroesophageal reflux disease)   . Hypothyroid    Past Surgical History:  Procedure Laterality Date  . ABDOMINAL HYSTERECTOMY Bilateral 10/10/2016   Procedure: HYSTERECTOMY ABDOMINAL WITH BILATERAL SALPINGO OOPHERECTOMY;  Surgeon: Harlin Heys, MD;  Location: [facility] OR[facility];  Service: Gynecology;  Laterality: Bilateral;  . CERVICAL CONE BIOPSY  1990  . [place]  2012  . TUBAL LIGATION  1999   Social History   Socioeconomic History  . Marital status: Divorced    Spouse name: Not on file  . Number of children: Not on file  . Years of education: Not on file  . Highest education level: Not on file  Occupational History  . Not on file  Social Needs  . Financial resource strain: Not on file  . Food insecurity:    Worry: Not on file    Inability: Not on file  . Transportation needs:    Medical: Not on file    Non-medical: Not on file  Tobacco Use  . Smoking status: Never Smoker  . Smokeless tobacco: Never Used  Substance and Sexual Activity  . Alcohol use: Yes    Alcohol/week: 0.0 standard drinks    Comment: Occasional   . Drug use: Bailey    . Sexual activity: Yes    Partners: Male    Birth control/protection: Surgical    Comment: Multiple   Lifestyle  . Physical activity:    Days per week: Not on file    Minutes per session: Not on file  . Stress: Not on file  Relationships  . Social connections:    Talks on phone: Not on file    Gets together: Not on file    Attends religious service: Not on file    Active member of club or organization: Not on file    Attends meetings of clubs or organizations: Not on file    Relationship status: Not on file  Other Topics Concern  . 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 Aspirin Other (See Comments)    Ears ring  . Erythromycin Nausea And Vomiting  . Food     Wheat-coughing/sneezing & joint pain.  . Metronidazole Nausea Only    Paresthesias, dizziness  . Sulfa Antibiotics Rash   Family History  Problem Relation Age of Onset  . Diabetes Mother   . Hypothyroidism Mother   . Diabetes Maternal Uncle   .  Dementia Maternal Grandmother   . Stroke Maternal Grandmother   . Diabetes Maternal Grandfather   . Alzheimer's disease Paternal Grandmother   . Hypothyroidism Son     Current Outpatient Medications (Endocrine & Metabolic):  .  estradiol (CLIMARA - DOSED IN MG/24 HR) 0.05 mg/24hr patch, Place 1 patch (0.05 mg total) onto the skin once a week. .  levothyroxine (SYNTHROID, LEVOTHROID) 100 MCG tablet, Take 100 mcg by mouth daily before breakfast.    Current Outpatient Medications (Analgesics):  .  etodolac (LODINE) 400 MG tablet, Take 400 mg by mouth 2 (two) times daily. Marland Kitchen  ibuprofen (ADVIL,MOTRIN) 200 MG tablet, Take 3 tablets (600 mg total) by mouth every 6 (six) hours as needed for mild pain (for pain/headache.).  Current Outpatient Medications (Hematological):  .  cyanocobalamin 1000 MCG tablet, Take 1,000 mcg by  mouth daily.  Current Outpatient Medications (Other):  .  amitriptyline (ELAVIL) 10 MG tablet, Take 10 mg by mouth at bedtime. Marland Kitchen  BLACK COHOSH COMPOUND PO, Take 1 tablet by mouth daily. .  Cholecalciferol (VITAMIN D-3) 5000 units TABS, Take 5,000 Units by mouth daily with supper. .  clindamycin (CLEOCIN) 2 % vaginal cream, Place 1 Applicatorful vaginally at bedtime. .  cyclobenzaprine (FLEXERIL) 10 MG tablet, Take 1 tablet (10 mg total) by mouth 3 (three) times daily as needed for muscle spasms. Marland Kitchen  DHEA 10 MG CAPS, Take 10 mg by mouth daily. .  Progesterone 100 MG SUPP, Place 1 suppository (100 mg total) vaginally daily. .  Red Yeast Rice Extract (RED YEAST RICE PO), Take 1 tablet by mouth daily. Marland Kitchen  escitalopram (LEXAPRO) 10 MG tablet, Take 1 tablet (10 mg total) by mouth daily. .  traZODone (DESYREL) 50 MG tablet, Take 0.5-1 tablets (25-50 mg total) by mouth at bedtime as needed for sleep.    Past medical history, social, surgical and family history all reviewed in electronic medical record.  Bailey pertanent information unless stated regarding to the chief complaint.   Review of Systems:  Bailey , visual changes, nausea, vomiting, diarrhea, constipation, dizziness, abdominal pain, skin rash, fevers, chills, night sweats, weight loss, swollen lymph nodes, body aches, joint swelling, chest pain, shortness of breath, mood changes.  Positive muscle aches, headache  Objective  Blood pressure 140/88, pulse 92, height 5' 5.5" (1.664 m), weight 191 lb (86.6 kg), last menstrual period 09/23/2016, SpO2 98 %. Systems examined below as of    General: Bailey apparent distress alert and oriented x3 mood and affect normal, dressed appropriately.  HEENT: Pupils equal, extraocular movements intact  Respiratory: Patient's speak in full sentences and does not appear short of breath  Cardiovascular: Bailey lower extremity edema, non tender, Bailey erythema  Skin: Warm dry intact with Bailey signs of infection or rash on  extremities or on axial skeleton.  Abdomen: Soft nontender  Neuro: Cranial nerves II through XII are intact, neurovascularly intact in all extremities with 2+ DTRs and 2+ pulses.  Lymph: Bailey lymphadenopathy of posterior or anterior cervical chain or axillae bilaterally.  Gait normal with good balance and coordination.  MSK:  Non tender with full range of motion and good stability and symmetric strength and tone of shoulders, elbows, wrist, hip, knee and ankles bilaterally.      Impression and Recommendations:     This case required medical decision making of moderate complexity. The above documentation has been reviewed and is accurate and complete Lyndal Pulley, DO       Note: This dictation was prepared  with Dragon dictation along with smaller phrase technology. Any transcriptional errors that result from this process are unintentional.

## 2018-03-04 ENCOUNTER — Ambulatory Visit: Payer: BLUE CROSS/BLUE SHIELD | Admitting: Family Medicine

## 2018-03-04 ENCOUNTER — Encounter: Payer: Self-pay | Admitting: Family Medicine

## 2018-03-04 DIAGNOSIS — F329 Major depressive disorder, single episode, unspecified: Secondary | ICD-10-CM | POA: Diagnosis not present

## 2018-03-04 MED ORDER — ESCITALOPRAM OXALATE 10 MG PO TABS: 10.0000 mg | ORAL_TABLET | Freq: Every day | ORAL | 3 refills | Status: DC

## 2018-03-04 MED ORDER — TRAZODONE HCL 50 MG PO TABS: 25.0000 mg | ORAL_TABLET | Freq: Every evening | ORAL | 3 refills | Status: DC | PRN

## 2018-03-04 NOTE — Assessment & Plan Note (Signed)
Depression discussed at great length Discussed lexapro with not responding to effexor  Discussed side effects Discussed when to call  RTC in 4-6 weeks

## 2018-03-04 NOTE — Patient Instructions (Signed)
Good to see you  [facility].  Lexapro 10 mg daily  Trazadone 1/2-1 tab at night as needed Please send me a message in 2 weeks and tell me how you are doing  Have an appointment set up in 4-6 weeks

## 2018-03-22 DIAGNOSIS — M5441 Lumbago with sciatica, right side: Secondary | ICD-10-CM | POA: Diagnosis not present

## 2018-04-22 DIAGNOSIS — M5441 Lumbago with sciatica, right side: Secondary | ICD-10-CM | POA: Diagnosis not present

## 2018-04-22 DIAGNOSIS — E669 Obesity, unspecified: Secondary | ICD-10-CM | POA: Diagnosis not present

## 2018-04-22 DIAGNOSIS — M7061 Trochanteric bursitis, right hip: Secondary | ICD-10-CM | POA: Diagnosis not present

## 2018-04-22 DIAGNOSIS — M4317 Spondylolisthesis, lumbosacral region: Secondary | ICD-10-CM | POA: Diagnosis not present

## 2018-04-26 ENCOUNTER — Telehealth: Payer: Self-pay

## 2018-04-26 NOTE — Telephone Encounter (Signed)
Bethany Bailey ([facility]) called triage line and left vm for Dr. Kenton Kingfisher that they are having issues with their fax machine so she cannot fax the after visit summary from meeting with pt as of now, but once it is fixed she will fax it.

## 2018-04-29 ENCOUNTER — Ambulatory Visit (INDEPENDENT_AMBULATORY_CARE_PROVIDER_SITE_OTHER): Payer: BLUE CROSS/BLUE SHIELD

## 2018-04-29 ENCOUNTER — Ambulatory Visit: Payer: BLUE CROSS/BLUE SHIELD | Admitting: Family Medicine

## 2018-04-29 ENCOUNTER — Encounter: Payer: Self-pay | Admitting: Family Medicine

## 2018-04-29 VITALS — BP 142/98 | HR 83 | Temp 98.4°F | Ht 65.5 in | Wt 193.0 lb

## 2018-04-29 DIAGNOSIS — R091 Pleurisy: Secondary | ICD-10-CM | POA: Diagnosis not present

## 2018-04-29 DIAGNOSIS — R05 Cough: Secondary | ICD-10-CM

## 2018-04-29 DIAGNOSIS — R079 Chest pain, unspecified: Secondary | ICD-10-CM

## 2018-04-29 DIAGNOSIS — R059 Cough, unspecified: Secondary | ICD-10-CM

## 2018-04-29 DIAGNOSIS — E669 Obesity, unspecified: Secondary | ICD-10-CM | POA: Diagnosis not present

## 2018-04-29 DIAGNOSIS — M7061 Trochanteric bursitis, right hip: Secondary | ICD-10-CM | POA: Diagnosis not present

## 2018-04-29 MED ORDER — METHYLPREDNISOLONE 4 MG PO TBPK: ORAL_TABLET | ORAL | 0 refills | Status: DC

## 2018-04-29 MED ORDER — GUAIFENESIN-CODEINE 100-10 MG/5ML PO SOLN: 10.0000 mL | Freq: Four times a day (QID) | ORAL | 0 refills | Status: DC | PRN

## 2018-04-29 NOTE — Progress Notes (Signed)
Subjective:    Patient ID: Bethany Bailey, female    DOB: [birth year], 52 y.o.   MRN: [id number]  HPI   Patient presents to clinic complaining of pain in left side of chest when coughing, began 2 weeks ago, seems worse over the past couple of days.  States when she coughs she coughs up a thick clear mucus.  States she has had pleurisy in the past and this feels very similar to that.  Denies any fever or chills.  Denies wheezing or shortness of breath.  Denies any palpitations, feeling faint or dizzy.  Patient states she has been taking a decongestant over-the-counter and this has been helping her cough improve.  Patient Active Problem List   Diagnosis Date Noted  . Depression 02/03/2018  . Cognitive change 09/14/2017  . Bacterial vaginosis 01/26/2017  . Hot flashes 01/14/2017  . Menopause 01/14/2017  . Postconcussive syndrome 01/02/2017  . Post-operative state 10/10/2016  . Hypothyroidism 02/07/2016  . Well woman exam with routine gynecological exam 02/07/2016  . High risk heterosexual behavior 01/23/2015   Social History   Tobacco Use  . Smoking status: Never Smoker  . Smokeless tobacco: Never Used  Substance Use Topics  . Alcohol use: Yes    Alcohol/week: 0.0 standard drinks    Comment: Occasional    Review of Systems  Constitutional: Negative for chills, fatigue and fever.  HENT: Negative for congestion, ear pain, sinus pain and sore throat.   Eyes: Negative.   Respiratory: +cough. Negative for shortness of breath and wheezing.   Cardiovascular: +chest pain. Negative for palpitations and leg swelling.  Gastrointestinal: Negative for abdominal pain, diarrhea, nausea and vomiting.  Genitourinary: Negative for dysuria, frequency and urgency.  Musculoskeletal: Negative for arthralgias and myalgias.  Skin: Negative for color change, pallor and rash.  Neurological: Negative for syncope, light-headedness and headaches.  Psychiatric/Behavioral: The patient is not  nervous/anxious.      Objective:   Physical Exam  Constitutional: She is oriented to person, place, and time. She appears well-nourished.  Non-toxic appearance. She does not appear ill. No distress.  HENT:  Head: Normocephalic and atraumatic.  Eyes: EOM are normal.  Neck: Normal range of motion. Neck supple. No JVD present.  Cardiovascular: Normal rate and regular rhythm.  Pulmonary/Chest: Effort normal and breath sounds normal. No accessory muscle usage or stridor. No tachypnea. No respiratory distress. She has no decreased breath sounds. She has no wheezes. She has no rhonchi. She has no rales.  Musculoskeletal:       Right lower leg: She exhibits no edema.       Left lower leg: She exhibits no edema.  Pain in left chest reproducible with palpation.  Neurological: She is alert and oriented to person, place, and time. No cranial nerve deficit.  Skin: Skin is warm and dry. No pallor.  Psychiatric: She has a normal mood and affect.  Nursing note and vitals reviewed.   Vitals:   04/29/18 1449  BP: (!) 142/98  Pulse: 83  Temp: 98.4 F (36.9 C)  SpO2: 97%      Assessment & Plan:   Cough, Chest pain, Pleurisy - EKG done in clinic and reviewed by me, EKG is unremarkable and does not show any acute cardiac issues.  We will also get chest x-ray in clinic to rule out any pneumonias or other abnormalities within the lungs.  Due to chest pain being with producible on exam and also cough seemed to irritate the pain, I do suspect  pleurisy is because.  Patient will continue to take her decongestant to help with cough as this is already been beneficial to her.  She has been given a cough syrup with codeine to use at night so she can get some sleep, patient is aware that this medication can cause drowsiness to not drive after taking.  She will also take steroid taper to calm down inflammation in hopes of reducing pleurisy pain.  Keep regularly scheduled follow-up as planned.  Return to clinic  sooner if current symptoms persist or worsen or new issues arise.

## 2018-04-29 NOTE — Patient Instructions (Signed)
Pleurisy Pleurisy is irritation and swelling (inflammation) of the linings of your lungs (pleura). This can cause pain in your chest, back, or shoulder. It can also cause trouble breathing. Follow these instructions at home: Medicines  Take over-the-counter and prescription medicines only as told by your doctor like decongestant, cough medication and ibuprofen. IActivity  Rest and return to your normal activities as told by your doctor. Ask your doctor what activities are safe for you.  Do not drive or use heavy machinery while taking prescription pain medicine. General instructions  Watch for any changes in your condition.  Take deep breaths often, even if it is painful. This can help prevent lung problems.  When lying down, lie on your painful side. This may help you feel less pain.  Do not smoke. If you need help quitting, ask your doctor.  Keep all follow-up visits as told by your doctor. This is important. Contact a doctor if:  You have pain that: ? Gets worse. ? Does not get better with medicine. ? Lasts for more than 1 week.  You have a fever or chills.  You have a cough that does not get better at home.  You have trouble breathing that does not get better at home.  You cough up liquid that looks like pus (purulent secretions). Get help right away if:  Your lips, fingernails, or toenails turn dark or turn blue.  You cough up blood.  You have trouble breathing that gets worse.  You are making loud noises when you breathe (wheezing) and this gets worse.  You have pain that spreads to your neck, arms, or jaw.  You get a rash.  You throw up (vomit).  You pass out (faint). Summary  Pleurisy is irritation and swelling (inflammation) of the linings of your lungs (pleura).  Pleurisy can cause pain and trouble breathing.  If you have a cough that does not get better at home, contact your doctor.  Get help right away if you are having trouble breathing and it  is getting worse. This information is not intended to replace advice given to you by your health care provider. Make sure you discuss any questions you have with your health care provider. Document Released: 06/05/2008 Document Revised: 03/17/2016 Document Reviewed: 03/17/2016 Elsevier Interactive Patient Education  2017 [employer].

## 2018-04-30 ENCOUNTER — Encounter: Payer: Self-pay | Admitting: Family Medicine

## 2018-04-30 ENCOUNTER — Telehealth: Payer: Self-pay

## 2018-04-30 ENCOUNTER — Telehealth: Payer: Self-pay | Admitting: *Deleted

## 2018-04-30 ENCOUNTER — Encounter: Payer: Self-pay | Admitting: Family

## 2018-04-30 NOTE — Telephone Encounter (Signed)
Copied from [place] [id number]. Topic: General - Other >> Apr 30, 2018 10:49 AM Yvette Rack wrote: Reason for CRM: Pt called in for lab results. Pt requests call back once results are available.

## 2018-04-30 NOTE — Telephone Encounter (Signed)
CXR negative for any acute issues. Lungs clear, heart size normal. No pneumonia. Pain most likely is from pleurisy

## 2018-04-30 NOTE — Telephone Encounter (Signed)
faxed

## 2018-04-30 NOTE — Telephone Encounter (Signed)
Charla from Amy Bryan's office calling to see if [facility] received fax they sent over yesterday.  They need PH's approval or recommendations faxed back asap.  Office # [phone]  Fax# [phone]

## 2018-04-30 NOTE — Telephone Encounter (Signed)
Pt had chest xray yesterday.

## 2018-05-03 NOTE — Telephone Encounter (Signed)
Called Pt with X-ray results Pt stated she understood and had no questions.Pt also stated she saw her results on My chart and went to the pharmacy to pick up her prescription which she already started on Saturday.

## 2018-05-17 ENCOUNTER — Telehealth: Payer: Self-pay

## 2018-05-17 NOTE — Telephone Encounter (Signed)
Lets discuss her kristen  What do I have available? Or anyone tomorrow? Lauren?

## 2018-05-17 NOTE — Telephone Encounter (Unsigned)
Copied from [place] [id number]. Topic: Appointment Scheduling - Scheduling Inquiry for Clinic >> May 17, 2018  8:44 AM Ahmed Prima L wrote: Reason for CRM: pt is calling and stated she seen Philis Nettle on 10/24 for pleurisy and is not getting better, She would like to follow up with Joycelyn Schmid today or see someone tomorrow, she is a 30 min appointment patient and there is nothing available the rest of the month with Joycelyn Schmid. She also states she would like to be checked out for her bursitis in right hip. Please advise.

## 2018-05-17 NOTE — Telephone Encounter (Signed)
Appointment scheduled with Ander Purpura, Np , patient doesn't feel like she is getting any better from pleurisy still having symptoms.

## 2018-05-17 NOTE — Telephone Encounter (Signed)
Copied from [place] [id number]. Topic: Appointment Scheduling - Scheduling Inquiry for Clinic >> May 17, 2018  8:44 AM Ahmed Prima L wrote: Reason for CRM: pt is calling and stated she seen Philis Nettle on 10/24 for pleurisy and is not getting better, She would like to follow up with Joycelyn Schmid today or see someone tomorrow, she is a 30 min appointment patient and there is nothing available the rest of the month with Joycelyn Schmid. She also states she would like to be checked out for her bursitis in right hip. Please advise.

## 2018-05-18 ENCOUNTER — Encounter: Payer: Self-pay | Admitting: Family Medicine

## 2018-05-18 ENCOUNTER — Ambulatory Visit: Payer: BLUE CROSS/BLUE SHIELD | Admitting: Family Medicine

## 2018-05-18 VITALS — BP 136/78 | HR 77 | Temp 98.4°F | Ht 65.5 in | Wt 190.8 lb

## 2018-05-18 DIAGNOSIS — R05 Cough: Secondary | ICD-10-CM

## 2018-05-18 DIAGNOSIS — R079 Chest pain, unspecified: Secondary | ICD-10-CM | POA: Diagnosis not present

## 2018-05-18 DIAGNOSIS — R091 Pleurisy: Secondary | ICD-10-CM | POA: Diagnosis not present

## 2018-05-18 DIAGNOSIS — R059 Cough, unspecified: Secondary | ICD-10-CM

## 2018-05-18 LAB — CBC WITH DIFFERENTIAL/PLATELET
BASOS PCT: 0.7 % (ref 0.0–3.0)
Basophils Absolute: 0.1 10*3/uL (ref 0.0–0.1)
EOS PCT: 0.7 % (ref 0.0–5.0)
Eosinophils Absolute: 0.1 10*3/uL (ref 0.0–0.7)
HEMATOCRIT: 39.2 % (ref 36.0–46.0)
HEMOGLOBIN: 13.4 g/dL (ref 12.0–15.0)
LYMPHS PCT: 28.2 % (ref 12.0–46.0)
Lymphs Abs: 2.4 10*3/uL (ref 0.7–4.0)
MCHC: 34.3 g/dL (ref 30.0–36.0)
MCV: 89.1 fl (ref 78.0–100.0)
MONOS PCT: 6.9 % (ref 3.0–12.0)
Monocytes Absolute: 0.6 10*3/uL (ref 0.1–1.0)
Neutro Abs: 5.4 10*3/uL (ref 1.4–7.7)
Neutrophils Relative %: 63.5 % (ref 43.0–77.0)
Platelets: 240 10*3/uL (ref 150.0–400.0)
RBC: 4.39 Mil/uL (ref 3.87–5.11)
RDW: 13 % (ref 11.5–15.5)
WBC: 8.4 10*3/uL (ref 4.0–10.5)

## 2018-05-18 LAB — COMPREHENSIVE METABOLIC PANEL
ALBUMIN: 4.5 g/dL (ref 3.5–5.2)
ALT: 30 U/L (ref 0–35)
AST: 17 U/L (ref 0–37)
Alkaline Phosphatase: 51 U/L (ref 39–117)
BUN: 18 mg/dL (ref 6–23)
CALCIUM: 9.8 mg/dL (ref 8.4–10.5)
CHLORIDE: 103 meq/L (ref 96–112)
CO2: 30 mEq/L (ref 19–32)
CREATININE: 0.68 mg/dL (ref 0.40–1.20)
GFR: 97.05 mL/min (ref >60.00)
Glucose, Bld: 107 mg/dL — ABNORMAL HIGH (ref 70–99)
POTASSIUM: 4.3 meq/L (ref 3.5–5.1)
SODIUM: 141 meq/L (ref 135–145)
Total Bilirubin: 0.3 mg/dL (ref 0.2–1.2)
Total Protein: 7.1 g/dL (ref 6.0–8.3)

## 2018-05-18 MED ORDER — PREDNISONE 10 MG (21) PO TBPK: ORAL_TABLET | ORAL | 0 refills | Status: DC

## 2018-05-18 NOTE — Progress Notes (Signed)
Subjective:    Patient ID: Bethany Bailey, female    DOB: May 10, 1966, 52 y.o.   MRN: [id number]  HPI  Patient presents to clinic with continued pleurisy pain since visit on 04/29/18.  CXR was done & unremarkable at that time. EKG was unremarkable as well.  Patient completed a steroid course, which did help for short period, but then pain slowly began to return.  It is a aching pain across rib cage.  Denies fever or chills.  Denies any chest pain or palpitations.  Does have a mild cough with some clear phlegm, but believes this is related to seasonal allergies.  Patient Active Problem List   Diagnosis Date Noted  . Depression 02/03/2018  . Cognitive change 09/14/2017  . Bacterial vaginosis 01/26/2017  . Hot flashes 01/14/2017  . Menopause 01/14/2017  . Postconcussive syndrome 01/02/2017  . Post-operative state 10/10/2016  . Hypothyroidism 02/07/2016  . Well woman exam with routine gynecological exam 02/07/2016  . High risk heterosexual behavior 01/23/2015   Social History   Tobacco Use  . Smoking status: Never Smoker  . Smokeless tobacco: Never Used  Substance Use Topics  . Alcohol use: Yes    Alcohol/week: 0.0 standard drinks    Comment: Occasional    Review of Systems  Constitutional: Negative for chills, fatigue and fever.  HENT: Negative for congestion, ear pain, sinus pain and sore throat.   Eyes: Negative.   Respiratory: +mild cough. Negative for shortness of breath and wheezing.   Cardiovascular: Negative for chest pain, palpitations and leg swelling. Left sided rib cage pain.  Gastrointestinal: Negative for abdominal pain, diarrhea, nausea and vomiting.  Genitourinary: Negative for dysuria, frequency and urgency.  Musculoskeletal: Negative for arthralgias and myalgias.  Skin: Negative for color change, pallor and rash.  Neurological: Negative for syncope, light-headedness and headaches.  Psychiatric/Behavioral: The patient is not nervous/anxious.         Objective:   Physical Exam Constitutional: She appears well-nourished.  Non-toxic appearance. She does not appear ill. No distress.  HENT:  Nose/throat: minor post nasal drip Head: Normocephalic and atraumatic.  Eyes: EOM are normal.  Neck: Normal range of motion. Neck supple. No JVD present.  Cardiovascular: Normal rate and regular rhythm.  Pulmonary/Chest: Effort normal and breath sounds normal. No accessory muscle usage or stridor. No tachypnea. No respiratory distress. She has no decreased breath sounds. She has no wheezes. She has no rhonchi. She has no rales.  Musculoskeletal:       Right lower leg: She exhibits no edema.       Left lower leg: She exhibits no edema.  Pain in left chest reproducible with palpation.  Neurological: She is alert and oriented to person, place, and time. No cranial nerve deficit.  Skin: Skin is warm and dry. No pallor.  Psychiatric: She has a normal mood and affect.  Nursing note and vitals reviewed.    Vitals:   05/18/18 1425  BP: 136/78  Pulse: 77  Temp: 98.4 F (36.9 C)  SpO2: 97%   Assessment & Plan:   Pleurisy, cough, left-sided chest pain- cardiac work-up done at last visit with EKG was unremarkable chest x-ray also was clear.  Suspect her pain since it is reproducible is either musculoskeletal in nature and/or related to pleurisy.  We will treat her with another steroid course.  If pain continues to persist even after the steroid course my next step in the plan is to consider a chest CT.  We will also get  lab work today including CBC and CMP to look for any elevated white cells, anemia, electrolyte abnormality.  Patient will keep regular follow-up with PCP as planned, results of lab work/response to steroid will determine next step in our plan of care.

## 2018-05-19 ENCOUNTER — Encounter: Payer: Self-pay | Admitting: Family Medicine

## 2018-05-26 ENCOUNTER — Other Ambulatory Visit: Payer: BLUE CROSS/BLUE SHIELD

## 2018-05-26 DIAGNOSIS — E039 Hypothyroidism, unspecified: Secondary | ICD-10-CM

## 2018-05-27 LAB — T3: T3, Total: 67 ng/dL — ABNORMAL LOW (ref 71–180)

## 2018-05-27 LAB — TSH: TSH: 0.881 u[IU]/mL (ref 0.450–4.500)

## 2018-05-27 LAB — T4, FREE: Free T4: 1.64 ng/dL (ref 0.82–1.77)

## 2018-05-31 ENCOUNTER — Encounter: Payer: Self-pay | Admitting: Podiatry

## 2018-05-31 ENCOUNTER — Ambulatory Visit: Payer: BLUE CROSS/BLUE SHIELD

## 2018-05-31 ENCOUNTER — Ambulatory Visit: Payer: BLUE CROSS/BLUE SHIELD | Admitting: Podiatry

## 2018-05-31 DIAGNOSIS — Q828 Other specified congenital malformations of skin: Secondary | ICD-10-CM | POA: Diagnosis not present

## 2018-05-31 DIAGNOSIS — M722 Plantar fascial fibromatosis: Secondary | ICD-10-CM

## 2018-05-31 DIAGNOSIS — M7752 Other enthesopathy of left foot: Secondary | ICD-10-CM

## 2018-06-01 NOTE — Progress Notes (Signed)
Subjective:  Patient ID: Bethany Bailey, female    DOB: 1965-09-28,  MRN: [id number] HPI Chief Complaint  Patient presents with  . Toe Pain    3rd toe left - tiny callused area tip of toe x 1 year, tender, rubs shoes  . New Patient (Initial Visit)    Est pt 09/2014    52 y.o. female presents with the above complaint.   ROS: Denies fever chills nausea vomiting muscle aches pains calf pain back pain chest pain shortness of breath and headache.  Past Medical History:  Diagnosis Date  . GERD (gastroesophageal reflux disease)   . Hypothyroid    Past Surgical History:  Procedure Laterality Date  . ABDOMINAL HYSTERECTOMY Bilateral 10/10/2016   Procedure: HYSTERECTOMY ABDOMINAL WITH BILATERAL SALPINGO OOPHERECTOMY;  Surgeon: Harlin Heys, MD;  Location: [facility] ORS;  Service: Gynecology;  Laterality: Bilateral;  . CERVICAL CONE BIOPSY  1990  . [place]  2012  . TUBAL LIGATION  1999    Current Outpatient Medications:  .  Calcium-Magnesium-Vitamin D (CALCIUM 500 PO), Take by mouth., Disp: , Rfl:  .  Omega-3 Fatty Acids (FISH OIL PO), Take by mouth., Disp: , Rfl:  .  Cholecalciferol (VITAMIN D-3) 5000 units TABS, Take 5,000 Units by mouth daily with supper., Disp: , Rfl:  .  cyanocobalamin 1000 MCG tablet, Take 1,000 mcg by mouth daily., Disp: , Rfl:  .  cyclobenzaprine (FLEXERIL) 10 MG tablet, Take 1 tablet (10 mg total) by mouth 3 (three) times daily as needed for muscle spasms., Disp: 30 tablet, Rfl: 0 .  DOTTI 0.05 MG/24HR patch, APPLY AND CHANGE TWICE WEEKLY AS DIRECTED, Disp: , Rfl: 2 .  etodolac (LODINE) 400 MG tablet, Take 400 mg by mouth 2 (two) times daily., Disp: , Rfl:  .  ibuprofen (ADVIL,MOTRIN) 200 MG tablet, Take 3 tablets (600 mg total) by mouth every 6 (six) hours as needed for mild pain (for pain/headache.)., Disp: 30 tablet, Rfl: 0 .  levothyroxine (SYNTHROID, LEVOTHROID) 100 MCG tablet, Take 100 mcg by mouth daily before breakfast., Disp: , Rfl:  .   progesterone (PROMETRIUM) 100 MG capsule, TAKE 1 CAPSULE BY MOUTH AT BEDTIME AT THE SAME TIMES AS MELATONIN DOSE, Disp: , Rfl: 1  Allergies  Allergen Reactions  . Aspirin Other (See Comments)    Ears ring  . Erythromycin Nausea And Vomiting  . Food     Wheat-coughing/sneezing & joint pain.  . Metronidazole Nausea Only    Paresthesias, dizziness  . Sulfa Antibiotics Rash   Review of Systems Objective:  There were no vitals filed for this visit.  General: Well developed, nourished, in no acute distress, alert and oriented x3   Dermatological: Skin is warm, dry and supple bilateral. Nails x 10 are well maintained; remaining integument appears unremarkable at this time. There are no open sores, no preulcerative lesions, no rash or signs of infection present.  A small porokeratotic lesion is noted distal aspect of the third digit left foot.  There is no overlying erythema cellulitis drainage or odor skin lines do not circumvent the lesion there are no thrombosed capillaries.  It is not pigmented.  Vascular: Dorsalis Pedis artery and Posterior Tibial artery pedal pulses are 2/4 bilateral with immedate capillary fill time. Pedal hair growth present. No varicosities and no lower extremity edema present bilateral.   Neruologic: Grossly intact via light touch bilateral. Vibratory intact via tuning fork bilateral. Protective threshold with Semmes Wienstein monofilament intact to all pedal sites bilateral. Patellar and  Achilles deep tendon reflexes 2+ bilateral. No Babinski or clonus noted bilateral.   Musculoskeletal: No gross boney pedal deformities bilateral. No pain, crepitus, or limitation noted with foot and ankle range of motion bilateral. Muscular strength 5/5 in all groups tested bilateral.  Gait: Unassisted, Nonantalgic.    Radiographs:  None taken  Assessment & Plan:   Assessment: Porokeratotic lesion distal aspect third digit left foot  Plan: Mechanical and chemical destruction  of lesion with Cantharone follow-up with her in 6 weeks provided a silicone toe.     Deaundra Dupriest T. [place], [place]

## 2018-06-07 DIAGNOSIS — E039 Hypothyroidism, unspecified: Secondary | ICD-10-CM | POA: Diagnosis not present

## 2018-07-21 ENCOUNTER — Ambulatory Visit: Payer: BLUE CROSS/BLUE SHIELD | Admitting: Family Medicine

## 2018-07-21 ENCOUNTER — Encounter: Payer: Self-pay | Admitting: Family Medicine

## 2018-07-21 VITALS — BP 124/78 | HR 86 | Temp 99.5°F | Resp 20 | Ht 65.5 in | Wt 195.8 lb

## 2018-07-21 DIAGNOSIS — J101 Influenza due to other identified influenza virus with other respiratory manifestations: Secondary | ICD-10-CM | POA: Diagnosis not present

## 2018-07-21 DIAGNOSIS — R062 Wheezing: Secondary | ICD-10-CM

## 2018-07-21 DIAGNOSIS — R52 Pain, unspecified: Secondary | ICD-10-CM

## 2018-07-21 DIAGNOSIS — R05 Cough: Secondary | ICD-10-CM

## 2018-07-21 DIAGNOSIS — R059 Cough, unspecified: Secondary | ICD-10-CM

## 2018-07-21 DIAGNOSIS — R509 Fever, unspecified: Secondary | ICD-10-CM

## 2018-07-21 LAB — POC INFLUENZA A&B (BINAX/QUICKVUE)
INFLUENZA A, POC: POSITIVE — AB
INFLUENZA B, POC: NEGATIVE

## 2018-07-21 MED ORDER — GUAIFENESIN-CODEINE 100-10 MG/5ML PO SOLN: 10.0000 mL | Freq: Three times a day (TID) | ORAL | 0 refills | Status: DC | PRN

## 2018-07-21 MED ORDER — PREDNISONE 10 MG (21) PO TBPK: ORAL_TABLET | ORAL | 0 refills | Status: DC

## 2018-07-21 MED ORDER — ALBUTEROL SULFATE 108 (90 BASE) MCG/ACT IN AEPB: 2.0000 | INHALATION_SPRAY | Freq: Four times a day (QID) | RESPIRATORY_TRACT | 1 refills | Status: DC | PRN

## 2018-07-21 NOTE — Progress Notes (Signed)
Subjective:    Patient ID: Bethany Bailey, female    DOB: 14-Oct-1965, 53 y.o.   MRN: [id number]  HPI   Patient presents to clinic with a of cough, congestion, wheezing, low energy, fever/chills for the past 4 days.  Patient has been trying to keep symptoms under control with Tylenol, increasing fluids and using a Robitussin cough syrup.  Patient is unsure if she has had any sick contacts, it is possible she works at [employer] and interacts with [employer] every day.  Patient Active Problem List   Diagnosis Date Noted  . Obesity (BMI 30.0-34.9) 03/02/2018  . Spondylolisthesis at L5-S1 level 03/02/2018  . Depression 02/03/2018  . Cognitive change 09/14/2017  . Bacterial vaginosis 01/26/2017  . Hot flashes 01/14/2017  . Menopause 01/14/2017  . Postconcussive syndrome 01/02/2017  . Impingement syndrome, shoulder, right 12/05/2016  . Post-operative state 10/10/2016  . Hypothyroidism 02/07/2016  . Well woman exam with routine gynecological exam 02/07/2016  . High risk heterosexual behavior 01/23/2015  . Gastritis determined by endoscopy 11/15/2013  . Heartburn 11/15/2013  . Hiatal hernia 11/15/2013   Social History   Tobacco Use  . Smoking status: Never Smoker  . Smokeless tobacco: Never Used  Substance Use Topics  . Alcohol use: Yes    Alcohol/week: 0.0 standard drinks    Comment: Occasional     Review of Systems   Constitutional: +chills, fatigue and fever.  HENT: +congestion, ear pain. Eyes: Negative.   Respiratory: +cough. Negative for shortness of breath and wheezing.   Cardiovascular: Negative for chest pain, palpitations and leg swelling.  Gastrointestinal: Negative for abdominal pain, diarrhea, nausea and vomiting.  Genitourinary: Negative for dysuria, frequency and urgency.  Musculoskeletal: +body aches Skin: Negative for color change, pallor and rash.  Neurological: Negative for syncope, light-headedness and headaches.  Psychiatric/Behavioral:  The patient is not nervous/anxious.       Objective:   Physical Exam Vitals signs and nursing note reviewed.  Constitutional:      Appearance: She is ill-appearing (Appears tired, like she does not feel well. ). She is not toxic-appearing.  HENT:     Head: Normocephalic and atraumatic.     Ears:     Comments: Fullness bilateral TMs    Nose: Congestion present.     Comments: +post nasal drip    Mouth/Throat:     Mouth: Mucous membranes are moist.  Eyes:     General: No scleral icterus.    Extraocular Movements: Extraocular movements intact.     Conjunctiva/sclera: Conjunctivae normal.  Neck:     Musculoskeletal: Neck supple. No neck rigidity.  Cardiovascular:     Rate and Rhythm: Normal rate and regular rhythm.  Pulmonary:     Effort: Pulmonary effort is normal. No respiratory distress.     Breath sounds: Wheezing (scattered wheezes) present.  Lymphadenopathy:     Cervical: No cervical adenopathy.  Skin:    General: Skin is warm and dry.     Coloration: Skin is not pale.  Neurological:     Mental Status: She is alert and oriented to person, place, and time.  Psychiatric:        Mood and Affect: Mood normal.        Behavior: Behavior normal.    Vitals:   07/21/18 0852  BP: 124/78  Pulse: 86  Resp: 20  Temp: 99.5 F (37.5 C)  SpO2: 99%      Assessment & Plan:   Influenza A, cough, wheezes, fever with  chills, body aches- flu testing is positive in clinic for influenza A.  Patient is on day 4 of symptoms, so she does not qualify for Tamiflu or Xofluza treatment.  She will rest, increase fluid intake, do good handwashing.  Patient will use albuterol inhaler as needed for any wheezes.  She will use Robitussin syrup with codeine as needed for cough, advised she can use a Mucinex tablet that is available over-the-counter during the day for cough symptoms as the Robitussin with codeine can cause sleepiness.  Advised to alternate Tylenol and Motrin as needed to help reduce  fever and help with body aches.  Patient will also take steroid taper course due to wheezes and her history of pleurisy.  Advised to keep regularly scheduled follow-up with PCP as planned.  She will return to clinic sooner if any issues arise.

## 2018-07-29 ENCOUNTER — Ambulatory Visit: Payer: BLUE CROSS/BLUE SHIELD | Admitting: Medical

## 2018-07-29 ENCOUNTER — Encounter: Payer: Self-pay | Admitting: Medical

## 2018-07-29 VITALS — BP 149/86 | HR 90 | Temp 98.2°F | Resp 16 | Ht 65.0 in | Wt 196.0 lb

## 2018-07-29 DIAGNOSIS — H1131 Conjunctival hemorrhage, right eye: Secondary | ICD-10-CM

## 2018-07-29 NOTE — Progress Notes (Signed)
   Subjective:    Patient ID: Bethany Bailey, female    DOB: [birth year], 52 y.o.   MRN: [id number]  HPI  53 yo female in non acute distress. Noticed this morning riight eye with redness about 7:45 am to day. Tender but not  Itching, no discharge. Denies visual changes.  No photophiobia.  Had the flu last week no fever since Thursday. Last menstrual period 09/23/2016. Blood pressure (!) 149/86, pulse 90, temperature 98.2 F (36.8 C), resp. rate 16, height 5\' 5"  (1.651 m), weight 196 lb (88.9 kg), last menstrual period 09/23/2016, SpO2 97 %.  Allergies  Allergen Reactions  . Aspirin Other (See Comments)    Ears ring  . Erythromycin Nausea And Vomiting  . Food     Wheat-coughing/sneezing & joint pain.  . Metronidazole Nausea Only    Paresthesias, dizziness  . Sulfa Antibiotics Rash    Review of Systems  Constitutional: Positive for fatigue. Negative for chills and fever.  HENT: Positive for congestion and rhinorrhea (using decongestant). Negative for ear pain, sinus pressure, sinus pain, sneezing and sore throat.   Eyes: Positive for pain (tenderness). Negative for photophobia, discharge, redness, itching and visual disturbance.  Respiratory: Positive for cough and shortness of breath (off and on using albuterol inhaler  bid).   Cardiovascular: Negative for chest pain, palpitations and leg swelling.  Gastrointestinal: Negative for abdominal pain.  Musculoskeletal: Positive for myalgias (slightly).  Skin: Negative for rash.  Neurological: Positive for dizziness (off and on). Negative for syncope and headaches.  Hematological: Negative for adenopathy.  Psychiatric/Behavioral: Negative for behavioral problems, confusion, self-injury and suicidal ideas.       Objective:   Physical Exam Vitals signs and nursing note reviewed.  Constitutional:      Appearance: Normal appearance.  HENT:     Head: Normocephalic and atraumatic.     Nose: Congestion present.     Mouth/Throat:   Mouth: Mucous membranes are moist.  Eyes:     General: No scleral icterus.       Right eye: No discharge.        Left eye: No discharge.     Extraocular Movements: Extraocular movements intact.     Pupils: Pupils are equal, round, and reactive to light.     Comments: Mild subconjunctival hemmorrage on the lateral side of the right eye.  Neurological:     General: No focal deficit present.     Mental Status: She is alert and oriented to person, place, and time.  Psychiatric:        Mood and Affect: Mood normal.        Behavior: Behavior normal.        Thought Content: Thought content normal.        Judgment: Judgment normal.           Assessment & Plan:  Subconjunctival Hemorrhage right eye Call if discharge occurs from eye or any acute changes.. Return or follow up with your doctor as needed. Reviewed diagnosis with patient, she verbalizes understanding and has no questions at discharge.

## 2018-07-29 NOTE — Patient Instructions (Signed)

## 2018-08-13 ENCOUNTER — Emergency Department: Payer: BLUE CROSS/BLUE SHIELD

## 2018-08-13 ENCOUNTER — Other Ambulatory Visit: Payer: Self-pay

## 2018-08-13 ENCOUNTER — Emergency Department
Admission: EM | Admit: 2018-08-13 | Discharge: 2018-08-13 | Disposition: A | Discharge status: Home / Self Care | Payer: BLUE CROSS/BLUE SHIELD | Attending: Emergency Medicine | Admitting: Emergency Medicine

## 2018-08-13 ENCOUNTER — Encounter: Payer: Self-pay | Admitting: Emergency Medicine

## 2018-08-13 DIAGNOSIS — R05 Cough: Secondary | ICD-10-CM | POA: Diagnosis not present

## 2018-08-13 DIAGNOSIS — R070 Pain in throat: Secondary | ICD-10-CM | POA: Diagnosis not present

## 2018-08-13 DIAGNOSIS — E039 Hypothyroidism, unspecified: Secondary | ICD-10-CM | POA: Insufficient documentation

## 2018-08-13 DIAGNOSIS — R1310 Dysphagia, unspecified: Secondary | ICD-10-CM | POA: Diagnosis present

## 2018-08-13 DIAGNOSIS — K209 Esophagitis, unspecified without bleeding: Secondary | ICD-10-CM

## 2018-08-13 DIAGNOSIS — Z79899 Other long term (current) drug therapy: Secondary | ICD-10-CM | POA: Diagnosis not present

## 2018-08-13 MED ORDER — ALUM & MAG HYDROXIDE-SIMETH 200-200-20 MG/5ML PO SUSP
30.0000 mL | Freq: Once | ORAL | Status: AC
  Administered 2018-08-13: 30 mL via ORAL
  Filled 2018-08-13: qty 30

## 2018-08-13 MED ORDER — GI COCKTAIL ~~LOC~~: 30.0000 mL | Freq: Two times a day (BID) | ORAL | 0 refills | Status: DC | PRN

## 2018-08-13 MED ORDER — LIDOCAINE VISCOUS HCL 2 % MT SOLN
15.0000 mL | Freq: Once | OROMUCOSAL | Status: AC
  Administered 2018-08-13: 15 mL via ORAL
  Filled 2018-08-13: qty 15

## 2018-08-13 NOTE — ED Triage Notes (Addendum)
Here for dysphagia. Pt ate dinner last night and started with pain in esophagus and feeling like something stuck.  Woke up in middle of night and was unable to pass liquids.  Now able to pass liquids but feels like hard to pass solid foods.  Describes as burning.  Pt tried pepcid last night and today without relief.

## 2018-08-13 NOTE — ED Notes (Signed)
PT reports complete relief with GI cocktail

## 2018-08-13 NOTE — ED Notes (Signed)
Discussed with dr Jimmye Norman.  Will give GI cocktail, no protocols.

## 2018-08-13 NOTE — ED Provider Notes (Signed)
[facility] Emergency Department Provider Note  Time seen: 12:03 PM  I have reviewed the triage vital signs and the nursing notes.   HISTORY  Chief Complaint dysphagia    HPI Bethany Bailey is a 53 y.o. female past medical history of gastric reflux, presents to the emergency department for feeling of burning in her esophagus.  According to the patient she ate chicken last night, somewhat spicy, woke overnight with a burning sensation that worsened when she tried to swallow.  States initially feels like heartburn now feels more like a pain when swallowing.  Patient received a GI cocktail in triage and states the pain is completely gone after the GI cocktail.  Patient has a history of dysphagia in the past, and has seen GI medicine for the same in the past her last scope was 5 years ago per patient.  Denies any discomfort at this time.  Patient is still complaining of a mild cough and feels like there is fluid buildup in her chest after being diagnosed with influenza last month.   Past Medical History:  Diagnosis Date  . GERD (gastroesophageal reflux disease)   . Hypothyroid     Patient Active Problem List   Diagnosis Date Noted  . Obesity (BMI 30.0-34.9) 03/02/2018  . Spondylolisthesis at L5-S1 level 03/02/2018  . Depression 02/03/2018  . Cognitive change 09/14/2017  . Bacterial vaginosis 01/26/2017  . Hot flashes 01/14/2017  . Menopause 01/14/2017  . Postconcussive syndrome 01/02/2017  . Impingement syndrome, shoulder, right 12/05/2016  . Post-operative state 10/10/2016  . Hypothyroidism 02/07/2016  . Well woman exam with routine gynecological exam 02/07/2016  . High risk heterosexual behavior 01/23/2015  . Gastritis determined by endoscopy 11/15/2013  . Heartburn 11/15/2013  . Hiatal hernia 11/15/2013    Past Surgical History:  Procedure Laterality Date  . ABDOMINAL HYSTERECTOMY Bilateral 10/10/2016   Procedure: HYSTERECTOMY ABDOMINAL WITH  BILATERAL SALPINGO OOPHERECTOMY;  Surgeon: Harlin Heys, MD;  Location: [facility] ORS;  Service: Gynecology;  Laterality: Bilateral;  . CERVICAL [place]E BIOPSY  1990  . [place]  2012  . TUBAL LIGATION  1999    Prior to Admission medications   Medication Sig Start Date End Date Taking? Authorizing Provider  Albuterol Sulfate 108 (90 Base) MCG/ACT AEPB Inhale 2 puffs into the lungs every 6 (six) hours as needed. 07/21/18   Jodelle Green, FNP  Calcium-Magnesium-Vitamin D (CALCIUM 500 PO) Take by mouth.    [provider]  Cholecalciferol (VITAMIN D-3) 5000 units TABS Take 5,000 Units by mouth daily with supper.    [provider]  cyanocobalamin 1000 MCG tablet Take 1,000 mcg by mouth daily.    [provider]  cyclobenzaprine (FLEXERIL) 10 MG tablet Take 1 tablet (10 mg total) by mouth 3 (three) times daily as needed for muscle spasms. 01/13/17   Leone Haven, MD  DOTTI 0.05 MG/24HR patch APPLY AND CHANGE TWICE WEEKLY AS DIRECTED 05/17/18   [provider]  etodolac (LODINE) 400 MG tablet Take 400 mg by mouth 2 (two) times daily.    [provider]  guaiFENesin-codeine 100-10 MG/5ML syrup Take 10 mLs by mouth 3 (three) times daily as needed for cough. 07/21/18   Guse, Jacquelynn Cree, FNP  ibuprofen (ADVIL,MOTRIN) 200 MG tablet Take 3 tablets (600 mg total) by mouth every 6 (six) hours as needed for mild pain (for pain/headache.). 10/12/16   Rubie Maid, MD  levothyroxine (SYNTHROID, LEVOTHROID) 100 MCG tablet Take 100 mcg by mouth  daily before breakfast.    [provider]  Omega-3 Fatty Acids (FISH OIL PO) Take by mouth.    [provider]  predniSONE (STERAPRED UNI-PAK 21 TAB) 10 MG (21) TBPK tablet Take according to pack instructions 07/21/18   Guse, Jacquelynn Cree, FNP  progesterone (PROMETRIUM) 100 MG capsule TAKE 1 CAPSULE BY MOUTH AT BEDTIME AT THE SAME TIMES AS MELATONIN DOSE 05/17/18   [provider]    Allergies   Allergen Reactions  . Aspirin Other (See Comments)    Ears ring  . Erythromycin Nausea And Vomiting  . Food     Wheat-coughing/sneezing & joint pain.  . Metronidazole Nausea Only    Paresthesias, dizziness  . Sulfa Antibiotics Rash    Family History  Problem Relation Age of Onset  . Diabetes Mother   . Hypothyroidism Mother   . Diabetes Maternal Uncle   . Dementia Maternal Grandmother   . Stroke Maternal Grandmother   . Diabetes Maternal Grandfather   . Alzheimer's disease Paternal Grandmother   . Hypothyroidism Son     Social History Social History   Tobacco Use  . Smoking status: Never Smoker  . Smokeless tobacco: Never Used  Substance Use Topics  . Alcohol use: Yes    Alcohol/week: 0.0 standard drinks    Comment: Occasional   . Drug use: No    Review of Systems Constitutional: Negative for fever. Cardiovascular: Patient states a feeling of heartburn completely resolved after GI cocktail. Respiratory: Negative for shortness of breath. Gastrointestinal: Negative for abdominal pain, vomiting Musculoskeletal: Negative for musculoskeletal complaints Neurological: Negative for headache All other ROS negative  ____________________________________________   PHYSICAL EXAM:  VITAL SIGNS: ED Triage Vitals  Enc Vitals Group     BP 08/13/18 1015 (!) 147/84     Pulse Rate 08/13/18 1015 83     Resp 08/13/18 1015 18     Temp 08/13/18 1015 98.3 F (36.8 C)     Temp Source 08/13/18 1015 Oral     SpO2 08/13/18 1015 99 %     Weight 08/13/18 1020 194 lb (88 kg)     Height 08/13/18 1020 5\' 5"  (1.651 m)     Head Circumference --      Peak Flow --      Pain Score 08/13/18 1020 0     Pain Loc --      Pain Edu? --      Excl. in [place]? --     Constitutional: Alert and oriented. Well appearing and in no distress. Eyes: Normal exam ENT   Head: Normocephalic and atraumatic   Mouth/Throat: Mucous membranes are moist. Cardiovascular: Normal rate, regular rhythm. No  murmur Respiratory: Normal respiratory effort without tachypnea nor retractions. Breath sounds are clear.  Occasional cough during exam. Gastrointestinal: Soft and nontender. No distention.  Musculoskeletal: Nontender with normal range of motion in all extremities.  Neurologic:  Normal speech and language. No gross focal neurologic deficits  Skin:  Skin is warm, dry and intact.  Psychiatric: Mood and affect are normal.   ____________________________________________    RADIOLOGY  Chest x-ray negative  ____________________________________________   INITIAL IMPRESSION / ASSESSMENT AND PLAN / ED COURSE  Pertinent labs & imaging results that were available during my care of the patient were reviewed by me and considered in my medical decision making (see chart for details).  Patient presents to the emergency department for dysphagia.  Patient states it feels like heartburn, mild burning when swallowing completely resolved after GI  cocktail.  Patient took Pepcid just before going to bed last night but also ate spicy chicken for dinner last night.  Symptoms are very suggestive of esophagitis and patient states she has had esophageal issues in the past.  We will obtain a chest x-ray given her continued cough.  As the patient's symptoms are resolved I discussed with the patient to continue use of Pepcid at home we will also discharge with GI cocktails twice a day for the next 3 to 4 days and refer to GI medicine.  Patient agreeable to plan of care.  Chest x-ray negative.  We will discharge the patient home with GI cocktail she is to continue to take her Pepcid.  Patient agreeable to plan of care. ____________________________________________   FINAL CLINICAL IMPRESSION(S) / ED DIAGNOSES  Esophagitis   Harvest Dark, MD 08/13/18 1249

## 2018-09-08 DIAGNOSIS — N951 Menopausal and female climacteric states: Secondary | ICD-10-CM | POA: Diagnosis not present

## 2018-09-09 ENCOUNTER — Other Ambulatory Visit: Payer: Self-pay | Admitting: Obstetrics & Gynecology

## 2018-09-13 ENCOUNTER — Telehealth: Payer: Self-pay | Admitting: Obstetrics & Gynecology

## 2018-09-13 NOTE — Telephone Encounter (Signed)
Called and left voice mail for patient to call back to be schedule °

## 2018-09-13 NOTE — Telephone Encounter (Signed)
-----   Message from Gae Dry, MD sent at 09/09/2018  5:13 PM EST ----- Regarding: Sch Annual exam [place]

## 2018-10-05 ENCOUNTER — Encounter: Payer: Self-pay | Admitting: Obstetrics & Gynecology

## 2018-10-05 ENCOUNTER — Other Ambulatory Visit: Payer: Self-pay

## 2018-10-05 ENCOUNTER — Other Ambulatory Visit (HOSPITAL_COMMUNITY)
Admission: RE | Admit: 2018-10-05 | Discharge: 2018-10-05 | Disposition: A | Discharge status: Home / Self Care | Payer: BLUE CROSS/BLUE SHIELD | Source: Ambulatory Visit | Attending: Obstetrics & Gynecology | Admitting: Obstetrics & Gynecology

## 2018-10-05 ENCOUNTER — Ambulatory Visit (INDEPENDENT_AMBULATORY_CARE_PROVIDER_SITE_OTHER): Payer: BLUE CROSS/BLUE SHIELD | Admitting: Obstetrics & Gynecology

## 2018-10-05 VITALS — BP 130/80 | Ht 65.5 in | Wt 194.0 lb

## 2018-10-05 DIAGNOSIS — Z01419 Encounter for gynecological examination (general) (routine) without abnormal findings: Secondary | ICD-10-CM

## 2018-10-05 DIAGNOSIS — Z113 Encounter for screening for infections with a predominantly sexual mode of transmission: Secondary | ICD-10-CM | POA: Insufficient documentation

## 2018-10-05 DIAGNOSIS — Z1211 Encounter for screening for malignant neoplasm of colon: Secondary | ICD-10-CM

## 2018-10-05 DIAGNOSIS — Z1272 Encounter for screening for malignant neoplasm of vagina: Secondary | ICD-10-CM

## 2018-10-05 DIAGNOSIS — Z1239 Encounter for other screening for malignant neoplasm of breast: Secondary | ICD-10-CM

## 2018-10-05 NOTE — Progress Notes (Signed)
HPI:      [name] is a 53 y.o. G0P0000 who LMP was in the past, she presents today for her annual examination.  The patient has no complaints today.  Uses BHRT for hot flashes and vag dryness, significant improvements noted.  Some acne side effects at times. The patient is sexually active. Herlast pap: approximate date 2017 and was normal and last mammogram: approximate date 2017 and was normal.  The patient does perform self breast exams.  There is no notable family history of breast or ovarian cancer in her family. The patient is taking hormone replacement therapy. Patient denies post-menopausal vaginal bleeding.   The patient has regular exercise: yes. The patient denies current symptoms of depression.    GYN Hx: Last Colonoscopy:5 years ago. Normal.   PMHx: Past Medical History:  Diagnosis Date  . GERD (gastroesophageal reflux disease)   . Hypothyroid    Past Surgical History:  Procedure Laterality Date  . ABDOMINAL HYSTERECTOMY Bilateral 10/10/2016   Procedure: HYSTERECTOMY ABDOMINAL WITH BILATERAL SALPINGO OOPHERECTOMY;  Surgeon: Harlin Heys, MD;  Location: [facility] ORS;  Service: Gynecology;  Laterality: Bilateral;  . CERVICAL CONE BIOPSY  1990  . [place]  2012  . TUBAL LIGATION  1999   Family History  Problem Relation Age of Onset  . Diabetes Mother   . Hypothyroidism Mother   . Diabetes Maternal Uncle   . Dementia Maternal Grandmother   . Stroke Maternal Grandmother   . Diabetes Maternal Grandfather   . Alzheimer's disease Paternal Grandmother   . Hypothyroidism Son    Social History   Tobacco Use  . Smoking status: Never Smoker  . Smokeless tobacco: Never Used  Substance Use Topics  . Alcohol use: Yes    Alcohol/week: 0.0 standard drinks    Comment: Occasional   . Drug use: No    Current Outpatient Medications:  .  estradiol (VIVELLE-DOT) 0.075 MG/24HR, APPLY 1 PATCH TWICE A WEEK, Disp: , Rfl:  .  levothyroxine (SYNTHROID, LEVOTHROID) 100  MCG tablet, Take 100 mcg by mouth daily before breakfast., Disp: , Rfl:  .  progesterone (PROMETRIUM) 100 MG capsule, TAKE 1 CAPSULE BY MOUTH AT BEDTIME AT THE SAME TIMES AS MELATONIN DOSE, Disp: , Rfl: 1 .  VAGIFEM 10 MCG TABS vaginal tablet, , Disp: , Rfl:  .  Albuterol Sulfate 108 (90 Base) MCG/ACT AEPB, Inhale 2 puffs into the lungs every 6 (six) hours as needed. (Patient not taking: Reported on 10/05/2018), Disp: 1 each, Rfl: 1 .  Alum & Mag Hydroxide-Simeth (GI COCKTAIL) SUSP suspension, Take 30 mLs by mouth 2 (two) times daily as needed for indigestion (abd pain). Shake well. Each dose to containe 48mL maalox and 34mL viscous lidocaine (Patient not taking: Reported on 10/05/2018), Disp: 300 mL, Rfl: 0 .  Calcium-Magnesium-Vitamin D (CALCIUM 500 PO), Take by mouth., Disp: , Rfl:  .  Cholecalciferol (VITAMIN D-3) 5000 units TABS, Take 5,000 Units by mouth daily with supper., Disp: , Rfl:  .  cyanocobalamin 1000 MCG tablet, Take 1,000 mcg by mouth daily., Disp: , Rfl:  .  cyclobenzaprine (FLEXERIL) 10 MG tablet, Take 1 tablet (10 mg total) by mouth 3 (three) times daily as needed for muscle spasms. (Patient not taking: Reported on 10/05/2018), Disp: 30 tablet, Rfl: 0 .  DOTTI 0.05 MG/24HR patch, APPLY AND CHANGE TWICE WEEKLY AS DIRECTED, Disp: , Rfl: 2 .  etodolac (LODINE) 400 MG tablet, Take 400 mg by mouth 2 (two) times daily., Disp: ,  Rfl:  .  guaiFENesin-codeine 100-10 MG/5ML syrup, Take 10 mLs by mouth 3 (three) times daily as needed for cough. (Patient not taking: Reported on 10/05/2018), Disp: 240 mL, Rfl: 0 .  ibuprofen (ADVIL,MOTRIN) 200 MG tablet, Take 3 tablets (600 mg total) by mouth every 6 (six) hours as needed for mild pain (for pain/headache.). (Patient not taking: Reported on 10/05/2018), Disp: 30 tablet, Rfl: 0 .  Omega-3 Fatty Acids (FISH OIL PO), Take by mouth., Disp: , Rfl:  .  predniSONE (STERAPRED UNI-PAK 21 TAB) 10 MG (21) TBPK tablet, Take according to pack instructions (Patient  not taking: Reported on 10/05/2018), Disp: 21 tablet, Rfl: 0 Allergies: Aspirin; Erythromycin; Food; Metronidazole; and Sulfa antibiotics  Review of Systems  Constitutional: Negative for chills, fever and malaise/fatigue.  HENT: Negative for congestion, sinus pain and sore throat.   Eyes: Negative for blurred vision and pain.  Respiratory: Negative for cough and wheezing.   Cardiovascular: Negative for chest pain and leg swelling.  Gastrointestinal: Negative for abdominal pain, constipation, diarrhea, heartburn, nausea and vomiting.  Genitourinary: Negative for dysuria, frequency, hematuria and urgency.  Musculoskeletal: Negative for back pain, joint pain, myalgias and neck pain.  Skin: Negative for itching and rash.  Neurological: Negative for dizziness, tremors and weakness.  Endo/Heme/Allergies: Does not bruise/bleed easily.  Psychiatric/Behavioral: Negative for depression. The patient is not nervous/anxious and does not have insomnia.     Objective: BP 130/80   Ht 5' 5.5" (1.664 m)   Wt 194 lb (88 kg)   LMP 09/23/2016 (Exact Date)   BMI 31.79 kg/m   Filed Weights   10/05/18 1523  Weight: 194 lb (88 kg)   Body mass index is 31.79 kg/m. Physical Exam Constitutional:      General: She is not in acute distress.    Appearance: She is well-developed.  Genitourinary:     Pelvic exam was performed with patient supine.     Vagina and rectum normal.     No lesions in the vagina.     No vaginal bleeding.     No right or left adnexal mass present.     Right adnexa not tender.     Left adnexa not tender.     Genitourinary Comments: Absent Uterus Absent cervix Vaginal cuff well healed  HENT:     Head: Normocephalic and atraumatic. No laceration.     Right Ear: Hearing normal.     Left Ear: Hearing normal.     Mouth/Throat:     Pharynx: Uvula midline.  Eyes:     Pupils: Pupils are equal, round, and reactive to light.  Neck:     Musculoskeletal: Normal range of motion and  neck supple.     Thyroid: No thyromegaly.  Cardiovascular:     Rate and Rhythm: Normal rate and regular rhythm.     Heart sounds: No murmur. No friction rub. No gallop.   Pulmonary:     Effort: Pulmonary effort is normal. No respiratory distress.     Breath sounds: Normal breath sounds. No wheezing.  Chest:     Breasts:        Right: No mass, skin change or tenderness.        Left: No mass, skin change or tenderness.  Abdominal:     General: Bowel sounds are normal. There is no distension.     Palpations: Abdomen is soft.     Tenderness: There is no abdominal tenderness. There is no rebound.  Musculoskeletal: Normal range of motion.  Neurological:     Mental Status: She is alert and oriented to person, place, and time.     Cranial Nerves: No cranial nerve deficit.  Skin:    General: Skin is warm and dry.  Psychiatric:        Judgment: Judgment normal.  Vitals signs reviewed.     Assessment: Annual Exam 1. Women's annual routine gynecological examination   2. Screen for STD (sexually transmitted disease)   3. Screening for breast cancer   4. Screening for vaginal cancer   5. Screen for colon cancer     Plan:            1. Vaginal Screening-  Pap smear done today  2. Breast screening- Exam annually and mammogram scheduled  3. Colonoscopy every 10 years, Hemoccult testing after age 61  4. Labs managed by PCP  5. Counseling for hormonal therapy: no change in therapy today; continues to use BHRT as tested and adjusted w Kristeen Miss at compounding pharmacy Vagifem and Boric Acid for vaginal sx's              6. FRAX - FRAX score for assessing the 10 year probability for fracture calculated and discussed today.  Based on age and score today, DEXA is not currently scheduled.  7. Prefers to have STD testing as she has exposures w partner of unknown history    F/U  Return in about 1 year (around 10/05/2019) for Annual.  Barnett Applebaum, MD, [facility], [facility] 10/05/2018  3:45 PM

## 2018-10-05 NOTE — Patient Instructions (Signed)
PAP every 3-5 years Mammogram every year    Call [phone] to schedule at [facility] Colonoscopy every 10 years Labs yearly to every 3 years (with PCP)

## 2018-10-07 LAB — RPR+HSVIGM+HBSAG+HSV2(IGG)+...
HEP B S AG: NEGATIVE
HIV Screen 4th Generation wRfx: NONREACTIVE
HSVI/II Comb IgM: <0.91 Ratio (ref 0.00–0.90)
RPR Ser Ql: NONREACTIVE

## 2018-10-08 LAB — CYTOLOGY - PAP
Chlamydia: NEGATIVE
Diagnosis: NEGATIVE
Neisseria Gonorrhea: NEGATIVE

## 2018-11-03 DIAGNOSIS — Z1211 Encounter for screening for malignant neoplasm of colon: Secondary | ICD-10-CM | POA: Diagnosis not present

## 2018-11-08 ENCOUNTER — Encounter: Payer: Self-pay | Admitting: Obstetrics & Gynecology

## 2018-11-09 ENCOUNTER — Other Ambulatory Visit: Payer: Self-pay | Admitting: Obstetrics & Gynecology

## 2018-11-09 MED ORDER — ESTRADIOL 0.075 MG/24HR TD PTTW: MEDICATED_PATCH | TRANSDERMAL | 1 refills | Status: DC

## 2018-11-09 MED ORDER — VAGIFEM 10 MCG VA TABS: 1.0000 | ORAL_TABLET | VAGINAL | 1 refills | Status: DC

## 2018-11-10 ENCOUNTER — Ambulatory Visit: Payer: BLUE CROSS/BLUE SHIELD | Admitting: Orthotics

## 2018-11-10 ENCOUNTER — Other Ambulatory Visit: Payer: BLUE CROSS/BLUE SHIELD | Admitting: Orthotics

## 2018-11-10 ENCOUNTER — Other Ambulatory Visit: Payer: Self-pay

## 2018-11-10 DIAGNOSIS — Q828 Other specified congenital malformations of skin: Secondary | ICD-10-CM

## 2018-11-10 DIAGNOSIS — M4317 Spondylolisthesis, lumbosacral region: Secondary | ICD-10-CM

## 2018-11-10 NOTE — Progress Notes (Signed)
Sending back to [facility] for adjustments arch

## 2018-11-17 LAB — FECAL OCCULT BLOOD, IMMUNOCHEMICAL: Fecal Occult Bld: NEGATIVE

## 2018-11-17 NOTE — Progress Notes (Signed)
I do not think she can have vaccine at her age

## 2018-11-24 ENCOUNTER — Other Ambulatory Visit: Payer: BLUE CROSS/BLUE SHIELD | Admitting: Orthotics

## 2018-11-24 ENCOUNTER — Telehealth: Payer: Self-pay | Admitting: Podiatry

## 2018-11-24 NOTE — Telephone Encounter (Signed)
Pt called and picked up her orthotics from [place] that you had sent back with the old ones to be made like and they are not correct still. They made it like the ones that she did not like.

## 2018-12-01 ENCOUNTER — Other Ambulatory Visit: Payer: BLUE CROSS/BLUE SHIELD | Admitting: Orthotics

## 2018-12-22 ENCOUNTER — Other Ambulatory Visit: Payer: Self-pay | Admitting: Obstetrics & Gynecology

## 2018-12-22 ENCOUNTER — Encounter: Payer: Self-pay | Admitting: Obstetrics & Gynecology

## 2018-12-22 MED ORDER — VAGIFEM 10 MCG VA TABS: 1.0000 | ORAL_TABLET | VAGINAL | 11 refills | Status: DC

## 2018-12-22 MED ORDER — ESTRADIOL 0.075 MG/24HR TD PTTW: MEDICATED_PATCH | TRANSDERMAL | 11 refills | Status: DC

## 2018-12-22 MED ORDER — PROGESTERONE MICRONIZED 100 MG PO CAPS: ORAL_CAPSULE | ORAL | 11 refills | Status: DC

## 2019-03-05 ENCOUNTER — Other Ambulatory Visit: Payer: Self-pay

## 2019-03-05 ENCOUNTER — Emergency Department
Admission: EM | Admit: 2019-03-05 | Discharge: 2019-03-05 | Disposition: A | Discharge status: Home / Self Care | Payer: BLUE CROSS/BLUE SHIELD | Attending: Emergency Medicine | Admitting: Emergency Medicine

## 2019-03-05 DIAGNOSIS — E039 Hypothyroidism, unspecified: Secondary | ICD-10-CM | POA: Insufficient documentation

## 2019-03-05 DIAGNOSIS — W11XXXD Fall on and from ladder, subsequent encounter: Secondary | ICD-10-CM | POA: Diagnosis not present

## 2019-03-05 DIAGNOSIS — S0990XA Unspecified injury of head, initial encounter: Secondary | ICD-10-CM

## 2019-03-05 DIAGNOSIS — F0781 Postconcussional syndrome: Secondary | ICD-10-CM | POA: Diagnosis not present

## 2019-03-05 DIAGNOSIS — S0990XD Unspecified injury of head, subsequent encounter: Secondary | ICD-10-CM | POA: Insufficient documentation

## 2019-03-05 DIAGNOSIS — Z79899 Other long term (current) drug therapy: Secondary | ICD-10-CM | POA: Insufficient documentation

## 2019-03-05 MED ORDER — CYCLOBENZAPRINE HCL 5 MG PO TABS: 5.0000 mg | ORAL_TABLET | Freq: Three times a day (TID) | ORAL | 0 refills | Status: DC | PRN

## 2019-03-05 MED ORDER — METOCLOPRAMIDE HCL 5 MG PO TABS: 5.0000 mg | ORAL_TABLET | Freq: Three times a day (TID) | ORAL | 0 refills | Status: DC | PRN

## 2019-03-05 NOTE — Discharge Instructions (Addendum)
Your exam is essentially normal following your fall at home. You do have symptoms of a mild concussion. Take your home Meclizine as directed. Take the prescription meds as prescribed. Return to the ED immediately for worsening symptoms.

## 2019-03-05 NOTE — ED Triage Notes (Signed)
Patient reports she fell off ladder (approximately 3 feet) on Thursday and hit her head, denies loss of consciousness.

## 2019-03-06 NOTE — ED Provider Notes (Signed)
[facility] Emergency Department Provider Note ____________________________________________  Time seen: 1935  I have reviewed the triage vital signs and the nursing notes.  HISTORY  Chief Complaint  Fall and Head Injury  HPI Bethany Bailey is a 53 y.o. female presents herself to the ED for evaluation of onset of headache,  vertigo, and nausea.  She reports onset this morning after awakening.  She gives her history of a fall 2 days prior, approximately 3 feet off the ground while climbing a ladder.  Patient lives alone, and describes she had placed a ladder next to the house, it was seated in mud, and apparently slipped that she attempted to ascend it.  She describes falling backwards hitting her head on the soft ground.  She denies any loss of consciousness, and also denied any immediate acute pain.  She was able to replace the ladder and complete the project.  She felt fine in the interim and has been of a normal state of health and wellbeing until this morning when she awoke with some mild headache pain.  Patient reports she had a significant concussion about 3 years prior after a fall, and is aware of the symptoms.  She dosed meclizine she had in the interim, and denied any significant benefit to her symptoms but she presents now for further evaluation of her symptoms.  She denies any visual disturbance, syncope, vomiting, weakness, slurred speech, or ataxia.  She denies any current pain or symptoms at the time of his presentation.  Past Medical History:  Diagnosis Date  . GERD (gastroesophageal reflux disease)   . Hypothyroid     Patient Active Problem List   Diagnosis Date Noted  . Obesity (BMI 30.0-34.9) 03/02/2018  . Spondylolisthesis at L5-S1 level 03/02/2018  . Depression 02/03/2018  . Cognitive change 09/14/2017  . Bacterial vaginosis 01/26/2017  . Hot flashes 01/14/2017  . Menopause 01/14/2017  . Postconcussive syndrome 01/02/2017  . Impingement  syndrome, shoulder, right 12/05/2016  . Post-operative state 10/10/2016  . Hypothyroidism 02/07/2016  . Well woman exam with routine gynecological exam 02/07/2016  . High risk heterosexual behavior 01/23/2015  . Gastritis determined by endoscopy 11/15/2013  . Heartburn 11/15/2013  . Hiatal hernia 11/15/2013    Past Surgical History:  Procedure Laterality Date  . ABDOMINAL HYSTERECTOMY Bilateral 10/10/2016   Procedure: HYSTERECTOMY ABDOMINAL WITH BILATERAL SALPINGO OOPHERECTOMY;  Surgeon: Harlin Heys, MD;  Location: [facility] ORS;  Service: Gynecology;  Laterality: Bilateral;  . CERVICAL CONE BIOPSY  1990  . [place]  2012  . TUBAL LIGATION  1999    Prior to Admission medications   Medication Sig Start Date End Date Taking? Authorizing Provider  Albuterol Sulfate 108 (90 Base) MCG/ACT AEPB Inhale 2 puffs into the lungs every 6 (six) hours as needed. Patient not taking: Reported on 10/05/2018 07/21/18   Jodelle Green, FNP  Alum & Mag Hydroxide-Simeth (GI COCKTAIL) SUSP suspension Take 30 mLs by mouth 2 (two) times daily as needed for indigestion (abd pain). Shake well. Each dose to containe 46mL maalox and 71mL viscous lidocaine Patient not taking: Reported on 10/05/2018 08/13/18   Harvest Dark, MD  Calcium-Magnesium-Vitamin D (CALCIUM 500 PO) Take by mouth.    [provider]  Cholecalciferol (VITAMIN D-3) 5000 units TABS Take 5,000 Units by mouth daily with supper.    [provider]  cyanocobalamin 1000 MCG tablet Take 1,000 mcg by mouth daily.    [provider]  cyclobenzaprine (FLEXERIL) 5 MG tablet Take  1 tablet (5 mg total) by mouth 3 (three) times daily as needed. 03/05/19   Azazel Franze, Dannielle Karvonen, PA-C  estradiol (VIVELLE-DOT) 0.075 MG/24HR APPLY 1 PATCH TWICE A WEEK 12/22/18   Gae Dry, MD  etodolac (LODINE) 400 MG tablet Take 400 mg by mouth 2 (two) times daily.    [provider]  guaiFENesin-codeine 100-10 MG/5ML syrup  Take 10 mLs by mouth 3 (three) times daily as needed for cough. Patient not taking: Reported on 10/05/2018 07/21/18   Jodelle Green, FNP  ibuprofen (ADVIL,MOTRIN) 200 MG tablet Take 3 tablets (600 mg total) by mouth every 6 (six) hours as needed for mild pain (for pain/headache.). Patient not taking: Reported on 10/05/2018 10/12/16   Rubie Maid, MD  levothyroxine (SYNTHROID, LEVOTHROID) 100 MCG tablet Take 100 mcg by mouth daily before breakfast.    [provider]  metoCLOPramide (REGLAN) 5 MG tablet Take 1 tablet (5 mg total) by mouth every 8 (eight) hours as needed for up to 5 days for nausea or vomiting. 03/05/19 03/10/19  Lashann Hagg, Dannielle Karvonen, PA-C  Omega-3 Fatty Acids (FISH OIL PO) Take by mouth.    [provider]  predniSONE (STERAPRED UNI-PAK 21 TAB) 10 MG (21) TBPK tablet Take according to pack instructions Patient not taking: Reported on 10/05/2018 07/21/18   Jodelle Green, FNP  progesterone (PROMETRIUM) 100 MG capsule TAKE 1 CAPSULE BY MOUTH AT BEDTIME AT THE SAME TIMES AS MELATONIN DOSE 12/22/18   Gae Dry, MD  VAGIFEM 10 MCG TABS vaginal tablet Place 1 tablet (10 mcg total) vaginally 2 (two) times a week. 12/23/18   Gae Dry, MD    Allergies Aspirin, Erythromycin, Food, Metronidazole, and Sulfa antibiotics  Family History  Problem Relation Age of Onset  . Diabetes Mother   . Hypothyroidism Mother   . Diabetes Maternal Uncle   . Dementia Maternal Grandmother   . Stroke Maternal Grandmother   . Diabetes Maternal Grandfather   . Alzheimer's disease Paternal Grandmother   . Hypothyroidism Son     Social History Social History   Tobacco Use  . Smoking status: Never Smoker  . Smokeless tobacco: Never Used  Substance Use Topics  . Alcohol use: Yes    Alcohol/week: 0.0 standard drinks    Comment: Occasional   . Drug use: No    Review of Systems  Constitutional: Negative for fever. Eyes: Negative for visual changes. ENT: Negative for  sore throat. Cardiovascular: Negative for chest pain. Respiratory: Negative for shortness of breath. Gastrointestinal: Negative for abdominal pain, vomiting and diarrhea. Genitourinary: Negative for dysuria. Musculoskeletal: Negative for back pain. Skin: Negative for rash. Neurological: Positive for headaches and vertigo.  Denies focal weakness or numbness. ____________________________________________  PHYSICAL EXAM:  VITAL SIGNS: ED Triage Vitals  Enc Vitals Group     BP 03/05/19 1910 (!) 156/106     Pulse Rate 03/05/19 1910 68     Resp 03/05/19 1910 18     Temp 03/05/19 1910 98.3 F (36.8 C)     Temp Source 03/05/19 1910 Oral     SpO2 03/05/19 1910 97 %     Weight 03/05/19 1911 190 lb (86.2 kg)     Height 03/05/19 1911 5' 5.5" (1.664 m)     Head Circumference --      Peak Flow --      Pain Score 03/05/19 1910 0     Pain Loc --      Pain Edu? --  Excl. in [place]? --     Constitutional: Alert and oriented. Well appearing and in no distress.  DCS equals 15 Head: Normocephalic and atraumatic.  No posterior abrasion, soft tissue swelling, hematoma, or laceration.  No battle sign appreciated. Eyes: Conjunctivae are normal. PERRL. Normal extraocular movements and fundi bilaterally.  No periorbital ecchymosis noted. Ears: Canals clear. TMs intact bilaterally.  No hemotympanum noted. Nose: No congestion/rhinorrhea/epistaxis. Mouth/Throat: Mucous membranes are moist.  Uvula is midline Neck: Supple.  Normal range of motion without crepitus.  No distracting tenderness is noted. Cardiovascular: Normal rate, regular rhythm. Normal distal pulses. Respiratory: Normal respiratory effort. No wheezes/rales/rhonchi. Gastrointestinal: Soft and nontender. No distention. Musculoskeletal: Nontender with normal range of motion in all extremities.  Neurologic: Cranial nerves II through XII grossly intact.  Negative pronator drift.  Normal finger-to-nose exam.  Normal tandem walk on exam.  No  indication of any cerebellar ataxia.  Normal gait without ataxia. Normal speech and language. No gross focal neurologic deficits are appreciated. Skin:  Skin is warm, dry and intact. No rash noted. Psychiatric: Mood and affect are normal. Patient exhibits appropriate insight and judgment. ____________________________________________   RADIOLOGY  deferred ____________________________________________  PROCEDURES  Procedures ____________________________________________  INITIAL IMPRESSION / ASSESSMENT AND PLAN / ED COURSE  Patient with ED evaluation of headache and dizziness following a mechanical fall and head contusion 2 days prior.  Patient clinical picture is overall benign and reassuring at this time.  Notification of any acute intracranial process or close head injury.  Patient presentation is consistent with a postconcussive syndrome.  We discussed the clinical findings of her exam and we also discussed the usefulness of CT imaging at this presentation.  I gave the patient the option for CT imaging considering she had presented for her symptoms.  She voiced understanding with probable low yield CT imaging based on her present symptoms.  We discussed treatment of her postconcussive symptoms and urgent return to the ED should symptoms worsen or become intractable.  Patient is agreeable to the plan and will be discharged with prescriptions for cyclobenzaprine and Reglan to take with her home meclizine.  She may also take Tylenol and/or Motrin as needed for additional pain relief.  Return precautions have been reviewed and patient is discharged at this time.  Bethany Bailey was evaluated in Emergency Department on 03/06/2019 for the symptoms described in the history of present illness. She was evaluated in the context of the global COVID-19 pandemic, which necessitated consideration that the patient might be at risk for infection with the SARS-CoV-2 virus that causes COVID-19. Institutional  protocols and algorithms that pertain to the evaluation of patients at risk for COVID-19 are in a state of rapid change based on information released by regulatory bodies including the CDC and federal and state organizations. These policies and algorithms were followed during the patient's care in the ED. ____________________________________________  FINAL CLINICAL IMPRESSION(S) / ED DIAGNOSES  Final diagnoses:  Minor head injury, initial encounter  Post concussion syndrome      Melvenia Needles, PA-C 03/06/19 0032    Vanessa Denver, MD 03/06/19 [phone]

## 2019-04-11 ENCOUNTER — Ambulatory Visit (INDEPENDENT_AMBULATORY_CARE_PROVIDER_SITE_OTHER): Payer: BLUE CROSS/BLUE SHIELD | Admitting: Family Medicine

## 2019-04-11 ENCOUNTER — Encounter: Payer: Self-pay | Admitting: Family Medicine

## 2019-04-11 ENCOUNTER — Other Ambulatory Visit: Payer: Self-pay

## 2019-04-11 DIAGNOSIS — R3915 Urgency of urination: Secondary | ICD-10-CM | POA: Insufficient documentation

## 2019-04-11 LAB — POC URINALSYSI DIPSTICK (AUTOMATED)
Bilirubin, UA: NEGATIVE
Blood, UA: NEGATIVE
Glucose, UA: NEGATIVE
Ketones, UA: NEGATIVE
Leukocytes, UA: NEGATIVE
Nitrite, UA: NEGATIVE
Protein, UA: NEGATIVE
Spec Grav, UA: 1.01 (ref 1.010–1.025)
Urobilinogen, UA: 0.2 E.U./dL
pH, UA: 6 (ref 5.0–8.0)

## 2019-04-11 NOTE — Assessment & Plan Note (Addendum)
New. UA normal. Given symptoms, microscopy checked and normal as well. Reassurance provided. Will send UCx.  In interim, rec push fluids, avoid bladder irritants, update if not improving with supportive measures.

## 2019-04-11 NOTE — Patient Instructions (Addendum)
Urine looking ok today. Increase water intake, limit spicy foods, caffeine.  Consider taking cranberry tablets for current symptoms, tylenol in place of ibuprofen.  Let us know if not improving over time or new symptoms develop.

## 2019-04-11 NOTE — Addendum Note (Signed)
Addended by: Brenton Grills on: [id number] AM   Modules accepted: Orders

## 2019-04-11 NOTE — Progress Notes (Addendum)
This visit was conducted in person.  BP 130/84 (BP Location: Left Arm, Patient Position: Sitting, Cuff Size: Normal)   Pulse 84   Temp 97.6 F (36.4 C) (Temporal)   Ht 5' 5.5" (1.664 m)   Wt 186 lb 5 oz (84.5 kg)   LMP 09/23/2016 (Exact Date)   SpO2 98%   BMI 30.53 kg/m    CC: ?UTI Subjective:    Patient ID: Merilyn Baba, female    DOB: September 17, 1965, 53 y.o.   MRN: [id number]  HPI: BEVAN CRYER is a 53 y.o. female presenting on 04/11/2019 for Urinary Urgency (C/o strong urge to urinate only to go a little.  Also, c/o urinary frequency.  Denies any other sxs. Sxs started about 1 wk ago. )   1 wk h/o urinary urgency associated with incomplete emptying, frequency. Some new leaking this past week. Today noted mild discomfort at end of urethra.   No fevers/chills, hematuria, vaginal discharge, nausea/vomiting, flank pain.   Hasn't tried anything for this yet.  Good water intake.  Avoids sodas, caffeine. Some spicy foods.  No recent UTI, no h/o recurrent or frequent UTIs.      Relevant past medical, surgical, family and social history reviewed and updated as indicated. Interim medical history since our last visit reviewed. Allergies and medications reviewed and updated. Outpatient Medications Prior to Visit  Medication Sig Dispense Refill  . Albuterol Sulfate 108 (90 Base) MCG/ACT AEPB Inhale 2 puffs into the lungs every 6 (six) hours as needed. 1 each 1  . Alum & Mag Hydroxide-Simeth (GI COCKTAIL) SUSP suspension Take 30 mLs by mouth 2 (two) times daily as needed for indigestion (abd pain). Shake well. Each dose to containe 34mL maalox and 81mL viscous lidocaine 300 mL 0  . Calcium-Magnesium-Vitamin D (CALCIUM 500 PO) Take by mouth.    . Cholecalciferol (VITAMIN D-3) 5000 units TABS Take 5,000 Units by mouth daily with supper.    . cyanocobalamin 1000 MCG tablet Take 1,000 mcg by mouth daily.    . cyclobenzaprine (FLEXERIL) 5 MG tablet Take 1 tablet (5 mg total) by mouth 3  (three) times daily as needed. 15 tablet 0  . estradiol (VIVELLE-DOT) 0.075 MG/24HR APPLY 1 PATCH TWICE A WEEK 8 patch 11  . etodolac (LODINE) 400 MG tablet Take 400 mg by mouth 2 (two) times daily.    Marland Kitchen guaiFENesin-codeine 100-10 MG/5ML syrup Take 10 mLs by mouth 3 (three) times daily as needed for cough. 240 mL 0  . ibuprofen (ADVIL,MOTRIN) 200 MG tablet Take 3 tablets (600 mg total) by mouth every 6 (six) hours as needed for mild pain (for pain/headache.). 30 tablet 0  . levothyroxine (SYNTHROID, LEVOTHROID) 100 MCG tablet Take 100 mcg by mouth daily before breakfast.    . Omega-3 Fatty Acids (FISH OIL PO) Take by mouth.    . predniSONE (STERAPRED UNI-PAK 21 TAB) 10 MG (21) TBPK tablet Take according to pack instructions 21 tablet 0  . progesterone (PROMETRIUM) 100 MG capsule TAKE 1 CAPSULE BY MOUTH AT BEDTIME AT THE SAME TIMES AS MELATONIN DOSE 30 capsule 11  . VAGIFEM 10 MCG TABS vaginal tablet Place 1 tablet (10 mcg total) vaginally 2 (two) times a week. 8 tablet 11  . metoCLOPramide (REGLAN) 5 MG tablet Take 1 tablet (5 mg total) by mouth every 8 (eight) hours as needed for up to 5 days for nausea or vomiting. 15 tablet 0   No facility-administered medications prior to visit.  Per HPI unless specifically indicated in ROS section below Review of Systems Objective:    BP 130/84 (BP Location: Left Arm, Patient Position: Sitting, Cuff Size: Normal)   Pulse 84   Temp 97.6 F (36.4 C) (Temporal)   Ht 5' 5.5" (1.664 m)   Wt 186 lb 5 oz (84.5 kg)   LMP 09/23/2016 (Exact Date)   SpO2 98%   BMI 30.53 kg/m   Wt Readings from Last 3 Encounters:  04/11/19 186 lb 5 oz (84.5 kg)  03/05/19 190 lb (86.2 kg)  10/05/18 194 lb (88 kg)    Physical Exam Vitals signs and nursing note reviewed.  Constitutional:      General: She is not in acute distress.    Appearance: Normal appearance. She is not ill-appearing.  Abdominal:     General: Abdomen is flat. Bowel sounds are normal. There is  no distension.     Palpations: Abdomen is soft. There is no mass.     Tenderness: There is no abdominal tenderness. There is no right CVA tenderness, left CVA tenderness, guarding or rebound.     Hernia: No hernia is present.  Neurological:     Mental Status: She is alert.  Psychiatric:        Mood and Affect: Mood normal.        Behavior: Behavior normal.       Results for orders placed or performed in visit on 04/11/19  POCT Urinalysis Dipstick (Automated)  Result Value Ref Range   Color, UA yellow    Clarity, UA clear    Glucose, UA Negative Negative   Bilirubin, UA negative    Ketones, UA negative    Spec Grav, UA 1.010 1.010 - 1.025   Blood, UA negative    pH, UA 6.0 5.0 - 8.0   Protein, UA Negative Negative   Urobilinogen, UA 0.2 0.2 or 1.0 E.U./dL   Nitrite, UA negative    Leukocytes, UA Negative Negative  Micro: WBC 0 RBC 0 Bact tr Casts none Epi rare UCx not sent  Lab Results  Component Value Date   CREATININE 0.68 05/18/2018   BUN 18 05/18/2018   NA 141 05/18/2018   K 4.3 05/18/2018   CL 103 05/18/2018   CO2 30 05/18/2018    Assessment & Plan:   Problem List Items Addressed This Visit    Urinary urgency    New. UA normal. Given symptoms, microscopy checked and normal as well. Reassurance provided. Will send UCx.  In interim, rec push fluids, avoid bladder irritants, update if not improving with supportive measures.       Relevant Orders   POCT Urinalysis Dipstick (Automated) (Completed)   Urine Culture       No orders of the defined types were placed in this encounter.  Orders Placed This Encounter  Procedures  . Urine Culture  . POCT Urinalysis Dipstick (Automated)    Follow up plan: Return if symptoms worsen or fail to improve.  Ria Bush, MD

## 2019-04-13 LAB — URINE CULTURE
MICRO NUMBER:: 954513
Result:: NO GROWTH
SPECIMEN QUALITY:: ADEQUATE

## 2019-04-25 ENCOUNTER — Encounter: Payer: Self-pay | Admitting: Family Medicine

## 2019-04-25 ENCOUNTER — Other Ambulatory Visit: Payer: Self-pay

## 2019-04-25 ENCOUNTER — Ambulatory Visit (INDEPENDENT_AMBULATORY_CARE_PROVIDER_SITE_OTHER): Payer: BLUE CROSS/BLUE SHIELD | Admitting: Family Medicine

## 2019-04-25 VITALS — BP 100/72 | HR 74 | Temp 97.8°F | Wt 185.0 lb

## 2019-04-25 DIAGNOSIS — M7072 Other bursitis of hip, left hip: Secondary | ICD-10-CM

## 2019-04-25 DIAGNOSIS — Z111 Encounter for screening for respiratory tuberculosis: Secondary | ICD-10-CM

## 2019-04-25 DIAGNOSIS — Z23 Encounter for immunization: Secondary | ICD-10-CM

## 2019-04-25 MED ORDER — METHYLPREDNISOLONE 4 MG PO TBPK: ORAL_TABLET | ORAL | 0 refills | Status: DC

## 2019-04-25 NOTE — Progress Notes (Signed)
Subjective:    Patient ID: Bethany Bailey, female    DOB: [birth year], 53 y.o.   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  Also complains of pain in left hip, believes her bursitis is acting up.  She is currently working at [employer] and has to be on her feet for many hours, so this in combination just with her regular day-to-day activities seems to be flaring up the pain.  Uses ibuprofen and Tylenol, does stretching daily as well.  Patient Active Problem List   Diagnosis Date Noted  . Urinary urgency 04/11/2019  . Obesity (BMI 30.0-34.9) 03/02/2018  . Spondylolisthesis at L5-S1 level 03/02/2018  . Depression 02/03/2018  . Cognitive change 09/14/2017  . Bacterial vaginosis 01/26/2017  . Hot flashes 01/14/2017  . Menopause 01/14/2017  . Postconcussive syndrome 01/02/2017  . Impingement syndrome, shoulder, right 12/05/2016  . Post-operative state 10/10/2016  . Hypothyroidism 02/07/2016  . Well woman exam with routine gynecological exam 02/07/2016  . High risk heterosexual behavior 01/23/2015  . Gastritis determined by endoscopy 11/15/2013  . Heartburn 11/15/2013  . Hiatal hernia 11/15/2013   Social History   Tobacco Use  . Smoking status: Never Smoker  . Smokeless tobacco: Never Used  Substance Use Topics  . Alcohol use: Yes    Alcohol/week: 0.0 standard drinks    Comment: Occasional    Review of Systems  Constitutional: Negative for chills, fatigue and fever.  HENT: Negative for congestion, ear pain, sinus pain and sore throat.   Eyes: Negative.   Respiratory: Negative for cough, shortness of breath and wheezing.   Cardiovascular: Negative for chest pain, palpitations and leg swelling.  Gastrointestinal: Negative for abdominal pain, diarrhea, nausea and vomiting.  Genitourinary: Negative for dysuria, frequency and urgency.  Musculoskeletal:+left hip pain Skin:  Negative for color change, pallor and rash.  Neurological: Negative for syncope, light-headedness and headaches.  Psychiatric/Behavioral: The patient is not nervous/anxious.       Objective:   Physical Exam Vitals signs and nursing note reviewed.  Constitutional:      General: She is not in acute distress.    Appearance: She is not toxic-appearing.  HENT:     Head: Normocephalic and atraumatic.  Neck:     Musculoskeletal: Normal range of motion.  Cardiovascular:     Rate and Rhythm: Normal rate and regular rhythm.  Pulmonary:     Effort: Pulmonary effort is normal. No respiratory distress.     Breath sounds: Normal breath sounds.  Musculoskeletal:        General: Tenderness (some pain in left hip with adduction and abduction) present.  Skin:    General: Skin is warm and dry.     Coloration: Skin is not jaundiced or pale.  Neurological:     Mental Status: She is alert and oriented to person, place, and time.     Gait: Gait normal.  Psychiatric:        Mood and Affect: Mood normal.        Behavior: Behavior normal.     Today's Vitals   04/25/19 1105  BP: 100/72  Pulse: 74  Temp: 97.8 F (36.6 C)  SpO2: 99%  Weight: 185 lb (83.9 kg)   Body mass index is 30.32 kg/m.     Assessment & Plan:     Left hip bursitis-suspect a flareup of chronic bursitis of left hip.  She will continue Tylenol range of motion stretching during exercises daily and use short burst steroid to help calm inflammation.  Advised once steroid is aware she can resume ibuprofen as needed as well.  Also discussed topical rubs like BenGay or Biofreeze that can be helpful.  TB screening-QuantiFERON gold TB testing drawn today  Hepatitis B vaccine series begun today in clinic

## 2019-04-27 ENCOUNTER — Encounter: Payer: Self-pay | Admitting: Family Medicine

## 2019-04-27 LAB — QUANTIFERON-TB GOLD PLUS
Mitogen-NIL: >10 IU/mL
NIL: 0.03 IU/mL
QuantiFERON-TB Gold Plus: NEGATIVE
TB1-NIL: 0.08 IU/mL
TB2-NIL: 0.08 IU/mL

## 2019-04-28 NOTE — Telephone Encounter (Signed)
Can you send her PDF of TB results?  LG

## 2019-05-04 ENCOUNTER — Other Ambulatory Visit: Payer: Self-pay

## 2019-05-04 ENCOUNTER — Encounter: Payer: Self-pay | Admitting: Podiatry

## 2019-05-04 ENCOUNTER — Ambulatory Visit (INDEPENDENT_AMBULATORY_CARE_PROVIDER_SITE_OTHER): Payer: BLUE CROSS/BLUE SHIELD | Admitting: Podiatry

## 2019-05-04 DIAGNOSIS — Q828 Other specified congenital malformations of skin: Secondary | ICD-10-CM

## 2019-05-04 NOTE — Progress Notes (Signed)
She presents today for follow-up of a poor keratoma to the second toe left foot distal aspect.  States that spot is come back as she refers to a porokeratotic lesion that we treated back in November of last year.  Objective: Vital signs are stable she is alert and oriented x3.  Poor pulses are palpable.  Porokeratotic lesion distal aspect second digit left foot does demonstrate no erythema cellulitis drainage or odor.  Assessment: Porokeratosis distal clavus second digit left.  Plan: Mechanical debridement followed by chemical debridement Cantharone under occlusion to be washed off thoroughly tomorrow morning I will follow-up with her in 6 weeks or as needed.

## 2019-05-25 ENCOUNTER — Ambulatory Visit (INDEPENDENT_AMBULATORY_CARE_PROVIDER_SITE_OTHER): Payer: BLUE CROSS/BLUE SHIELD | Admitting: Family Medicine

## 2019-05-25 ENCOUNTER — Encounter: Payer: Self-pay | Admitting: Family Medicine

## 2019-05-25 ENCOUNTER — Other Ambulatory Visit: Payer: Self-pay

## 2019-05-25 VITALS — BP 110/72 | HR 77 | Temp 96.5°F | Wt 183.6 lb

## 2019-05-25 DIAGNOSIS — Z23 Encounter for immunization: Secondary | ICD-10-CM | POA: Diagnosis not present

## 2019-05-25 NOTE — Progress Notes (Signed)
   Subjective:    Patient ID: Bethany Bailey, female    DOB: Sep 26, 1965, 53 y.o.   MRN: [id number]  HPI   Presents to clinic for second HepA/B vaccine.  Tolerated first vaccine with no issues.   Needs vaccine to be able to attend [employer]  Patient Active Problem List   Diagnosis Date Noted  . Urinary urgency 04/11/2019  . Obesity (BMI 30.0-34.9) 03/02/2018  . Spondylolisthesis at L5-S1 level 03/02/2018  . Depression 02/03/2018  . Cognitive change 09/14/2017  . Bacterial vaginosis 01/26/2017  . Hot flashes 01/14/2017  . Menopause 01/14/2017  . Postconcussive syndrome 01/02/2017  . Impingement syndrome, shoulder, right 12/05/2016  . Post-operative state 10/10/2016  . Hypothyroidism 02/07/2016  . Well woman exam with routine gynecological exam 02/07/2016  . High risk heterosexual behavior 01/23/2015  . Gastritis determined by endoscopy 11/15/2013  . Heartburn 11/15/2013  . Hiatal hernia 11/15/2013   Social History   Tobacco Use  . Smoking status: Never Smoker  . Smokeless tobacco: Never Used  Substance Use Topics  . Alcohol use: Yes    Alcohol/week: 0.0 standard drinks    Comment: Occasional    Review of Systems  Constitutional: Negative for chills, fatigue and fever.  HENT: Negative for congestion, ear pain, sinus pain and sore throat.   Eyes: Negative.   Respiratory: Negative for cough, shortness of breath and wheezing.   Cardiovascular: Negative for chest pain, palpitations and leg swelling.  Gastrointestinal: Negative for abdominal pain, diarrhea, nausea and vomiting.  Genitourinary: Negative for dysuria, frequency and urgency.  Musculoskeletal: Negative for arthralgias and myalgias.  Skin: Negative for color change, pallor and rash.  Neurological: Negative for syncope, light-headedness and headaches.  Psychiatric/Behavioral: The patient is not nervous/anxious.       Objective:   Physical Exam Vitals signs and nursing note reviewed.  Constitutional:       General: She is not in acute distress.    Appearance: She is not toxic-appearing.  Neck:     Musculoskeletal: Normal range of motion and neck supple. No neck rigidity.  Cardiovascular:     Rate and Rhythm: Normal rate and regular rhythm.     Heart sounds: Normal heart sounds.  Pulmonary:     Effort: Pulmonary effort is normal. No respiratory distress.     Breath sounds: Normal breath sounds.  Skin:    General: Skin is warm and dry.     Coloration: Skin is not jaundiced or pale.  Neurological:     Mental Status: She is alert and oriented to person, place, and time.     Gait: Gait normal.  Psychiatric:        Mood and Affect: Mood normal.        Behavior: Behavior normal.    Today's Vitals   05/25/19 1106  BP: 110/72  Pulse: 77  Temp: (!) 96.5 F (35.8 C)  TempSrc: Temporal  SpO2: 99%  Weight: 183 lb 9.6 oz (83.3 kg)   Body mass index is 30.09 kg/m.     Assessment & Plan:    Need for vaccination with Twinrix - Plan: Hepatitis A hepatitis B combined vaccine IM Particular Twinrix vaccine given in clinic today.  Part 3 is due in approximately 6 months.

## 2019-06-15 ENCOUNTER — Ambulatory Visit: Payer: BLUE CROSS/BLUE SHIELD | Admitting: Podiatry

## 2019-07-06 ENCOUNTER — Other Ambulatory Visit: Payer: Self-pay

## 2019-07-06 MED ORDER — VAGIFEM 10 MCG VA TABS: 1.0000 | ORAL_TABLET | VAGINAL | 4 refills | Status: DC

## 2019-08-18 ENCOUNTER — Encounter: Payer: Self-pay | Admitting: Obstetrics & Gynecology

## 2019-08-18 ENCOUNTER — Ambulatory Visit (INDEPENDENT_AMBULATORY_CARE_PROVIDER_SITE_OTHER): Payer: 59 | Admitting: Obstetrics & Gynecology

## 2019-08-18 ENCOUNTER — Other Ambulatory Visit (HOSPITAL_COMMUNITY)
Admission: RE | Admit: 2019-08-18 | Discharge: 2019-08-18 | Disposition: A | Discharge status: Home / Self Care | Payer: 59 | Source: Ambulatory Visit | Attending: Obstetrics & Gynecology | Admitting: Obstetrics & Gynecology

## 2019-08-18 ENCOUNTER — Other Ambulatory Visit: Payer: Self-pay

## 2019-08-18 VITALS — BP 120/80 | Ht 65.0 in | Wt 180.0 lb

## 2019-08-18 DIAGNOSIS — R232 Flushing: Secondary | ICD-10-CM

## 2019-08-18 DIAGNOSIS — Z01419 Encounter for gynecological examination (general) (routine) without abnormal findings: Secondary | ICD-10-CM | POA: Diagnosis not present

## 2019-08-18 DIAGNOSIS — Z1231 Encounter for screening mammogram for malignant neoplasm of breast: Secondary | ICD-10-CM

## 2019-08-18 DIAGNOSIS — Z1211 Encounter for screening for malignant neoplasm of colon: Secondary | ICD-10-CM

## 2019-08-18 DIAGNOSIS — Z113 Encounter for screening for infections with a predominantly sexual mode of transmission: Secondary | ICD-10-CM | POA: Diagnosis not present

## 2019-08-18 DIAGNOSIS — Z90710 Acquired absence of both cervix and uterus: Secondary | ICD-10-CM

## 2019-08-18 DIAGNOSIS — N951 Menopausal and female climacteric states: Secondary | ICD-10-CM

## 2019-08-18 MED ORDER — ESTRADIOL 0.1 MG/24HR TD PTTW: 1.0000 | MEDICATED_PATCH | TRANSDERMAL | 12 refills | Status: DC

## 2019-08-18 MED ORDER — PROGESTERONE MICRONIZED 100 MG PO CAPS: ORAL_CAPSULE | ORAL | 11 refills | Status: DC

## 2019-08-18 NOTE — Progress Notes (Signed)
HPI:      [name] is a 54 y.o. G0P0000 who LMP was in the past, she presents today for her annual examination.  The patient has no complaints today. The patient is sexually active, in a relationship but does not live w partner. Herlast pap: approximate date 2020 and was normal and last mammogram: patient does not recall when last mammogram was done.  The patient does perform self breast exams.  There is no notable family history of breast or ovarian cancer in her family. The patient is taking hormone replacement therapy, and reports improved hot flashes and sleep but still has vaginal dryness and also suffers from discharge and odor; taking boric acid almost daily as well as Replens. Patient denies post-menopausal vaginal bleeding.   The patient has regular exercise: yes. The patient denies current symptoms of depression.    GYN Hx: Last Colonoscopy:6 years (for other reasons than screening) ago. Normal.  Last DEXA: never ago.    PMHx: Past Medical History:  Diagnosis Date  . GERD (gastroesophageal reflux disease)   . Hypothyroid    Past Surgical History:  Procedure Laterality Date  . ABDOMINAL HYSTERECTOMY Bilateral 10/10/2016   Procedure: HYSTERECTOMY ABDOMINAL WITH BILATERAL SALPINGO OOPHERECTOMY;  Surgeon: Harlin Heys, MD;  Location: [facility] ORS;  Service: Gynecology;  Laterality: Bilateral;  . CERVICAL [place] BIOPSY  1990  . [place]  2012  . TUBAL LIGATION  1999   Family History  Problem Relation Age of Onset  . Diabetes Mother   . Hypothyroidism Mother   . Diabetes Maternal Uncle   . Dementia Maternal Grandmother   . Stroke Maternal Grandmother   . Diabetes Maternal Grandfather   . Alzheimer's disease Paternal Grandmother   . Hypothyroidism Son    Social History   Tobacco Use  . Smoking status: Never Smoker  . Smokeless tobacco: Never Used  Substance Use Topics  . Alcohol use: Yes    Alcohol/week: 0.0 standard drinks    Comment: Occasional   .  Drug use: No    Current Outpatient Medications:  .  Calcium-Magnesium-Vitamin D (CALCIUM 500 PO), Take by mouth., Disp: , Rfl:  .  Cholecalciferol (VITAMIN D-3) 5000 units TABS, Take 5,000 Units by mouth daily with supper., Disp: , Rfl:  .  cyanocobalamin 1000 MCG tablet, Take 1,000 mcg by mouth daily., Disp: , Rfl:  .  cyclobenzaprine (FLEXERIL) 5 MG tablet, Take 1 tablet (5 mg total) by mouth 3 (three) times daily as needed., Disp: 15 tablet, Rfl: 0 .  etodolac (LODINE) 400 MG tablet, Take 400 mg by mouth 2 (two) times daily., Disp: , Rfl:  .  ibuprofen (ADVIL,MOTRIN) 200 MG tablet, Take 3 tablets (600 mg total) by mouth every 6 (six) hours as needed for mild pain (for pain/headache.)., Disp: 30 tablet, Rfl: 0 .  levothyroxine (SYNTHROID, LEVOTHROID) 100 MCG tablet, Take 100 mcg by mouth daily before breakfast., Disp: , Rfl:  .  Omega-3 Fatty Acids (FISH OIL PO), Take by mouth., Disp: , Rfl:  .  progesterone (PROMETRIUM) 100 MG capsule, TAKE 1 CAPSULE BY MOUTH AT BEDTIME AT THE SAME TIMES AS MELATONIN DOSE, Disp: 30 capsule, Rfl: 11 .  estradiol (VIVELLE-DOT) 0.1 MG/24HR patch, Place 1 patch (0.1 mg total) onto the skin 2 (two) times a week., Disp: 8 patch, Rfl: 12 .  metoCLOPramide (REGLAN) 5 MG tablet, Take 1 tablet (5 mg total) by mouth every 8 (eight) hours as needed for up to 5 days for nausea  or vomiting., Disp: 15 tablet, Rfl: 0 Allergies: Aspirin, Erythromycin, Food, Metronidazole, and Sulfa antibiotics  Review of Systems  Constitutional: Negative for chills, fever and malaise/fatigue.  HENT: Negative for congestion, sinus pain and sore throat.   Eyes: Negative for blurred vision and pain.  Respiratory: Negative for cough and wheezing.   Cardiovascular: Negative for chest pain and leg swelling.  Gastrointestinal: Negative for abdominal pain, constipation, diarrhea, heartburn, nausea and vomiting.  Genitourinary: Negative for dysuria, frequency, hematuria and urgency.    Musculoskeletal: Negative for back pain, joint pain, myalgias and neck pain.  Skin: Negative for itching and rash.  Neurological: Negative for dizziness, tremors and weakness.  Endo/Heme/Allergies: Does not bruise/bleed easily.  Psychiatric/Behavioral: Negative for depression. The patient has insomnia. The patient is not nervous/anxious.     Objective: BP 120/80   Ht 5\' 5"  (1.651 m)   Wt 180 lb (81.6 kg)   LMP 09/23/2016 (Exact Date)   BMI 29.95 kg/m   Filed Weights   08/18/19 0939  Weight: 180 lb (81.6 kg)   Body mass index is 29.95 kg/m. Physical Exam Constitutional:      General: She is not in acute distress.    Appearance: She is well-developed.  Genitourinary:     Pelvic exam was performed with patient supine.     Vagina and rectum normal.     No lesions in the vagina.     No vaginal bleeding.     No right or left adnexal mass present.     Right adnexa not tender.     Left adnexa not tender.     Genitourinary Comments: Absent Uterus Absent cervix Vaginal cuff well healed  HENT:     Head: Normocephalic and atraumatic. No laceration.     Right Ear: Hearing normal.     Left Ear: Hearing normal.     Mouth/Throat:     Pharynx: Uvula midline.  Eyes:     Pupils: Pupils are equal, round, and reactive to light.  Neck:     Thyroid: No thyromegaly.  Cardiovascular:     Rate and Rhythm: Normal rate and regular rhythm.     Heart sounds: No murmur. No friction rub. No gallop.   Pulmonary:     Effort: Pulmonary effort is normal. No respiratory distress.     Breath sounds: Normal breath sounds. No wheezing.  Chest:     Breasts:        Right: No mass, skin change or tenderness.        Left: No mass, skin change or tenderness.  Abdominal:     General: Bowel sounds are normal. There is no distension.     Palpations: Abdomen is soft.     Tenderness: There is no abdominal tenderness. There is no rebound.  Musculoskeletal:        General: Normal range of motion.      Cervical back: Normal range of motion and neck supple.  Neurological:     Mental Status: She is alert and oriented to person, place, and time.     Cranial Nerves: No cranial nerve deficit.  Skin:    General: Skin is warm and dry.  Psychiatric:        Judgment: Judgment normal.  Vitals reviewed.    Assessment: Annual Exam 1. Women's annual routine gynecological examination   2. Encounter for screening mammogram for malignant neoplasm of breast   3. Screen for colon cancer   4. Screen for STD (sexually transmitted disease)  5. Vaginal dryness, menopausal   6. Vasomotor flushing    Plan:            1.  Vaginal Screening-  Pap smear schedule reviewed with patient  2. Breast screening- Exam annually and mammogram scheduled  3. Colonoscopy every 10 years, Hemoccult testing after age 52  4. Labs for STD screening done today  5. Counseling for hormonal therapy: wants to change HRT or dose due to hot flashes, insomnia and vaginal dryness Will stop Vagifem and increase dose of patch, cont progresterone Will also cont Boric acid as needed for discharge and odor              6. FRAX - FRAX score for assessing the 10 year probability for fracture calculated and discussed today.  Based on age and score today, DEXA is not currently scheduled.    F/U  Return in about 1 year (around 08/17/2020) for Annual.  Barnett Applebaum, MD, [facility], [facility] 08/18/2019  10:09 AM

## 2019-08-18 NOTE — Patient Instructions (Addendum)
PAP every 3-5 years Mammogram every year    Call [phone] to schedule at [facility]  Colonoscopy soon Labs yearly (with PCP)   Other labs today, screening

## 2019-08-20 LAB — RPR+HSVIGM+HBSAG+HSV2(IGG)+...
HIV Screen 4th Generation wRfx: NONREACTIVE
HSV 2 IgG, Type Spec: <0.91 index (ref 0.00–0.90)
HSVI/II Comb IgM: <0.91 Ratio (ref 0.00–0.90)
Hepatitis B Surface Ag: NEGATIVE
RPR Ser Ql: NONREACTIVE

## 2019-08-22 ENCOUNTER — Telehealth: Payer: Self-pay | Admitting: Gastroenterology

## 2019-08-22 LAB — GC/CHLAMYDIA PROBE AMP (~~LOC~~) NOT AT ARMC
Chlamydia: NEGATIVE
Comment: NEGATIVE
Comment: NORMAL
Neisseria Gonorrhea: NEGATIVE

## 2019-08-22 NOTE — Telephone Encounter (Signed)
Patient called & l/m on v/m to make an appointment. She's returning a call to [place].

## 2019-08-23 NOTE — Telephone Encounter (Signed)
LVM returning patients call to schedule her colonoscopy.  Asked her to please call the office back to schedule.  Thanks,  [employer]

## 2019-08-29 ENCOUNTER — Other Ambulatory Visit: Payer: Self-pay

## 2019-08-31 ENCOUNTER — Encounter: Payer: Self-pay | Admitting: *Deleted

## 2019-09-14 ENCOUNTER — Ambulatory Visit
Admission: RE | Admit: 2019-09-14 | Discharge: 2019-09-14 | Disposition: A | Discharge status: Home / Self Care | Payer: 59 | Source: Ambulatory Visit | Attending: Obstetrics & Gynecology | Admitting: Obstetrics & Gynecology

## 2019-09-14 DIAGNOSIS — Z1231 Encounter for screening mammogram for malignant neoplasm of breast: Secondary | ICD-10-CM | POA: Diagnosis not present

## 2019-09-19 ENCOUNTER — Inpatient Hospital Stay
Admission: RE | Admit: 2019-09-19 | Discharge: 2019-09-19 | Disposition: A | Discharge status: Home / Self Care | Payer: Self-pay | Source: Ambulatory Visit | Attending: *Deleted | Admitting: *Deleted

## 2019-09-19 ENCOUNTER — Other Ambulatory Visit: Payer: Self-pay | Admitting: *Deleted

## 2019-09-19 DIAGNOSIS — Z1231 Encounter for screening mammogram for malignant neoplasm of breast: Secondary | ICD-10-CM

## 2019-10-13 ENCOUNTER — Encounter: Payer: Self-pay | Admitting: Internal Medicine

## 2019-11-09 ENCOUNTER — Telehealth: Payer: Self-pay | Admitting: Family

## 2019-11-09 NOTE — Telephone Encounter (Signed)
Pt called and said she needs the second HepA&B vaccine. When I looked at past appointments it showed she requested this on a visit back in Nov. Since I am not clinical please look to see if patient is due for this vaccine and give patient a call.

## 2019-11-09 NOTE — Telephone Encounter (Signed)
This is the 0, 1, 6 month dosing. Patient is slightly out of time frame. Okay to get he scheduled for last hep a/b vaccine?

## 2019-11-14 NOTE — Telephone Encounter (Signed)
I went ahead & scheduled patient for 3rd hep a/b on Thursday. We are so booked out & that is all we had available the next couple weeks. I can call patient back just didn't want appointment to get taken. That okay Joycelyn Schmid?

## 2019-11-14 NOTE — Telephone Encounter (Signed)
I think it would be ok if we just let the patient know they may not receive the full effect , but I really feel they will this is just a few days off schedule but if we wait she  will be a month off schedule.What do you thin Bethany Bailey.

## 2019-11-14 NOTE — Telephone Encounter (Signed)
Pt is following up on below message.

## 2019-11-15 NOTE — Telephone Encounter (Signed)
Fine to schedule for tomorrow. She is still within 6 month time frame from 05/25/19 until 11/22/19.  Thankyou!

## 2019-11-16 NOTE — Telephone Encounter (Signed)
Done

## 2019-11-17 ENCOUNTER — Telehealth: Payer: Self-pay | Admitting: Family

## 2019-11-17 ENCOUNTER — Ambulatory Visit (INDEPENDENT_AMBULATORY_CARE_PROVIDER_SITE_OTHER): Payer: 59

## 2019-11-17 ENCOUNTER — Other Ambulatory Visit: Payer: Self-pay

## 2019-11-17 DIAGNOSIS — Z23 Encounter for immunization: Secondary | ICD-10-CM

## 2019-11-17 NOTE — Telephone Encounter (Signed)
close

## 2019-11-17 NOTE — Progress Notes (Signed)
Patient presented for Twinrix injection to left deltoid, patient voiced no concerns nor showed any signs of distress during injection. 

## 2019-12-07 ENCOUNTER — Other Ambulatory Visit: Payer: Self-pay

## 2019-12-07 ENCOUNTER — Encounter: Payer: Self-pay | Admitting: Internal Medicine

## 2019-12-07 ENCOUNTER — Ambulatory Visit (INDEPENDENT_AMBULATORY_CARE_PROVIDER_SITE_OTHER): Payer: 59 | Admitting: Internal Medicine

## 2019-12-07 ENCOUNTER — Telehealth: Payer: Self-pay | Admitting: Family

## 2019-12-07 VITALS — BP 140/90 | HR 83 | Temp 97.0°F | Ht 65.0 in | Wt 185.8 lb

## 2019-12-07 DIAGNOSIS — R030 Elevated blood-pressure reading, without diagnosis of hypertension: Secondary | ICD-10-CM

## 2019-12-07 DIAGNOSIS — M542 Cervicalgia: Secondary | ICD-10-CM | POA: Diagnosis not present

## 2019-12-07 DIAGNOSIS — G8929 Other chronic pain: Secondary | ICD-10-CM

## 2019-12-07 DIAGNOSIS — M545 Low back pain, unspecified: Secondary | ICD-10-CM

## 2019-12-07 DIAGNOSIS — M79641 Pain in right hand: Secondary | ICD-10-CM | POA: Diagnosis not present

## 2019-12-07 MED ORDER — CYCLOBENZAPRINE HCL 5 MG PO TABS: 5.0000 mg | ORAL_TABLET | Freq: Every evening | ORAL | 2 refills | Status: DC | PRN

## 2019-12-07 MED ORDER — PREDNISONE 20 MG PO TABS: ORAL_TABLET | ORAL | 0 refills | Status: DC

## 2019-12-07 NOTE — Telephone Encounter (Signed)
Call pt She is due for f/u and labs Please sch f/u appt

## 2019-12-07 NOTE — Progress Notes (Signed)
Chief Complaint  Patient presents with  . Hand Pain    right overall hand pain, ongoing since 11/16/19. 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 Hand pain got progressivley worse. Pain score 4/10.   Marland Kitchen Joint Pain    Knuckle joints of the right hand   F/u hand pain started job [employer] 04/2019 in pain dept and right hand dominant hand painful at times 10/10 today 4/10 worse with grasping and exertion/overuse/hyperextension and lifting 3-4 lbs of quarts of pain. No numbness/tingling of right hand mostly right index and extensor side and right thumb tendon and right MCP gets red and painful and inflamed at times and left hand similar distribution index and thumb pain but right worse. 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 Tried ibuprofen otc and helps tylenol does not help    Chronic neck pain and low back pain wants refill of flexeril    Review of Systems  Respiratory: Negative for shortness of breath.   Cardiovascular: Negative for chest pain.  Musculoskeletal: Positive for back pain, joint pain and neck pain.  Skin: Negative for rash.   Past Medical History:  Diagnosis Date  . GERD (gastroesophageal reflux disease)   . Hypothyroid    Past Surgical History:  Procedure Laterality Date  . ABDOMINAL HYSTERECTOMY Bilateral 10/10/2016   Procedure: HYSTERECTOMY ABDOMINAL WITH BILATERAL SALPINGO OOPHERECTOMY;  Surgeon: Harlin Heys, MD;  Location: [facility] ORS;  Service: Gynecology;  Laterality: Bilateral;  . BREAST BIOPSY Left 2005   benign  . CERVICAL [place]E BIOPSY  1990  . [place]  2012  . TUBAL LIGATION  1999   Family History  Problem Relation Age of Onset  . Diabetes Mother   . Hypothyroidism Mother   . Diabetes Maternal Uncle   . Dementia Maternal Grandmother   . Stroke Maternal Grandmother   . Diabetes Maternal Grandfather   . Alzheimer's disease Paternal Grandmother   . Hypothyroidism Son   . Breast cancer  Neg Hx    Social History   Socioeconomic History  . Marital status: Divorced    Spouse name: Not on file  . Number of children: Not on file  . Years of education: Not on file  . Highest education level: Not on file  Occupational History  . Not on file  Tobacco Use  . Smoking status: Never Smoker  . Smokeless tobacco: Never Used  Substance and Sexual Activity  . Alcohol use: Yes    Alcohol/week: 0.0 standard drinks    Comment: Occasional   . Drug use: No  . Sexual activity: Yes    Partners: Male    Birth control/protection: Surgical    Comment: Multiple   Other Topics Concern  . 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 Difficulty of Paying Living Expenses:   Food Insecurity:   . Worried About Charity fundraiser in the Last Year:   . Arboriculturist in the Last Year:   Transportation Needs:   . Film/video editor (Medical):   Marland Kitchen Lack of Transportation (Non-Medical):   Physical Activity:   . Days of Exercise per Week:   . Minutes of Exercise per Session:  Stress:   . Feeling of Stress :   Social Connections:   . Frequency of Communication with Friends and Family:   . Frequency of Social Gatherings with Friends and Family:   . Attends Religious Services:   . Active Member of Clubs or Organizations:   . Attends Archivist Meetings:   Marland Kitchen Marital Status:   Intimate Partner Violence:   . Fear of Current or Ex-Partner:   . Emotionally Abused:   Marland Kitchen Physically Abused:   . Sexually Abused:    Current Meds  Medication Sig  . Calcium-Magnesium-Vitamin D (CALCIUM 500 PO) Take by mouth.  . Cholecalciferol (VITAMIN D-3) 5000 units TABS Take 5,000 Units by mouth daily with supper.  . cyanocobalamin 1000 MCG tablet Take 1,000 mcg by mouth daily.  Marland Kitchen estradiol (VIVELLE-DOT) 0.1 MG/24HR  patch Place 1 patch (0.1 mg total) onto the skin 2 (two) times a week.  . etodolac (LODINE) 400 MG tablet Take 400 mg by mouth 2 (two) times daily.  Marland Kitchen levothyroxine (SYNTHROID, LEVOTHROID) 100 MCG tablet Take 100 mcg by mouth daily before breakfast.  . Omega-3 Fatty Acids (FISH OIL PO) Take by mouth.  . progesterone (PROMETRIUM) 100 MG capsule TAKE 1 CAPSULE BY MOUTH AT BEDTIME AT THE SAME TIMES AS MELATONIN DOSE   Allergies  Allergen Reactions  . Aspirin Other (See Comments)    Ears ring  . Erythromycin Nausea And Vomiting  . Food     Wheat-coughing/sneezing & joint pain.  . Metronidazole Nausea Only    Paresthesias, dizziness  . Sulfa Antibiotics Rash   No results found for this or any previous visit (from the past 2160 hour(s)). Objective  Body mass index is 30.92 kg/m. Wt Readings from Last 3 Encounters:  12/07/19 185 lb 12.8 oz (84.3 kg)  08/18/19 180 lb (81.6 kg)  05/25/19 183 lb 9.6 oz (83.3 kg)   Temp Readings from Last 3 Encounters:  12/07/19 (!) 97 F (36.1 C) (Temporal)  05/25/19 (!) 96.5 F (35.8 C) (Temporal)  04/25/19 97.8 F (36.6 C)   BP Readings from Last 3 Encounters:  12/07/19 140/90  08/18/19 120/80  05/25/19 110/72   Pulse Readings from Last 3 Encounters:  12/07/19 83  05/25/19 77  04/25/19 74    Physical Exam Vitals and nursing note reviewed.  Constitutional:      Appearance: Normal appearance. She is well-developed and well-groomed. She is obese.  HENT:     Head: Normocephalic and atraumatic.  Eyes:     Conjunctiva/sclera: Conjunctivae normal.     Pupils: Pupils are equal, round, and reactive to light.  Cardiovascular:     Rate and Rhythm: Normal rate and regular rhythm.     Heart sounds: Normal heart sounds. No murmur.  Pulmonary:     Effort: Pulmonary effort is normal.     Breath sounds: Normal breath sounds.  Musculoskeletal:       Arms:  Skin:    General: Skin is warm and dry.  Neurological:     General: No focal deficit  present.     Mental Status: She is alert and oriented to person, place, and time. Mental status is at baseline.     Gait: Gait normal.  Psychiatric:        Attention and Perception: Attention and perception normal.        Mood and Affect: Mood and affect normal.        Speech: Speech normal.        Behavior: Behavior  normal. Behavior is cooperative.        Thought Content: Thought content normal.        Cognition and Memory: Cognition and memory normal.        Judgment: Judgment normal.     Assessment  Plan  Pain of right hand ?arthritis/tenosynovitis- Plan: predniSONE (DELTASONE) 20 MG tablet taper x 7-10 days  Consider Xray and ortho hand in future Consider compression sleeve  Chronic low back pain, unspecified back pain laterality, unspecified whether sciatica present - Plan: cyclobenzaprine (FLEXERIL) 5 MG tablet  Cervicalgia - Plan: cyclobenzaprine (FLEXERIL) 5 MG tablet   Elevated BP  F/u with PCP needs comp labs and f/u  Check at home  Provider: Dr. Olivia Mackie McLean-Scocuzza-Internal Medicine

## 2019-12-07 NOTE — Patient Instructions (Signed)
Tylenol for pain as needed  Voltaren gel over the counter  Petra Kuba Wise/Made Tumeric/Ginger/Curcumin    Thumb spica splint    Hand Pain Many things can cause hand pain. Some common causes are:  An injury.  Repeating the same movement with your hand over and over (overuse).  Osteoporosis.  Arthritis.  Lumps in the tendons or joints of the hand and wrist (ganglion cysts).  Nerve compression syndromes (carpal tunnel syndrome).  Inflammation of the tendons (tendinitis).  Infection. Follow these instructions at home: Pay attention to any changes in your symptoms. Take these actions to help with your discomfort: Managing pain, stiffness, and swelling   Take over-the-counter and prescription medicines only as told by your health care provider.  Wear a hand splint or support as told by your health care provider.  If directed, put ice on the affected area: ? Put ice in a plastic bag. ? Place a towel between your skin and the bag. ? Leave the ice on for 20 minutes, 2-3 times a day. Activity  Take breaks from repetitive activity often.  Avoid activities that make your pain worse.  Minimize stress on your hands and wrists as much as possible.  Do stretches or exercises as told by your health care provider.  Do not do activities that make your pain worse. Contact a health care provider if:  Your pain does not get better after a few days of self-care.  Your pain gets worse.  Your pain affects your ability to do your daily activities. Get help right away if:  Your hand becomes warm, red, or swollen.  Your hand is numb or tingling.  Your hand is extremely swollen or deformed.  Your hand or fingers turn white or blue.  You cannot move your hand, wrist, or fingers. Summary  Many things can cause hand pain.  Contact your health care provider if your pain does not get better after a few days of self care.  Minimize stress on your hands and wrists as much as  possible.  Do not do activities that make your pain worse. This information is not intended to replace advice given to you by your health care provider. Make sure you discuss any questions you have with your health care provider. Document Revised: 03/19/2018 Document Reviewed: 03/19/2018 Elsevier Patient Education  [place] Tenosynovitis  De Quervain's tenosynovitis is a condition that causes inflammation of the tendon on the thumb side of the wrist. Tendons are cords of tissue that connect bones to muscles. The tendons in the hand pass through a tunnel called a sheath. A slippery layer of tissue (synovium) lets the tendons move smoothly in the sheath. With de Quervain's tenosynovitis, the sheath swells or thickens, causing friction and pain. The condition is also called de Quervain's disease and de Quervain's syndrome. It occurs most often in women who are 52 years old. What are the causes? The exact cause of this condition is not known. It may be associated with overuse of the hand and wrist. What increases the risk? You are more likely to develop this condition if you:  Use your hands far more than normal, especially if you repeat certain movements that involve twisting your hand or using a tight grip.  Are pregnant.  Are a middle-aged woman.  Have rheumatoid arthritis.  Have diabetes. What are the signs or symptoms? The main symptom of this condition is pain on the thumb side of the wrist. The pain may get worse  when you grasp something or turn your wrist. Other symptoms may include:  Pain that extends up the forearm.  Swelling of your wrist and hand.  Trouble moving the thumb and wrist.  A sensation of snapping in the wrist.  A bump filled with fluid (cyst) in the area of the pain. How is this diagnosed? This condition may be diagnosed based on:  Your symptoms and medical history.  A physical exam. During the exam, your health care provider  may do a simple test Wynn Maudlin test) that involves pulling your thumb and wrist to see if this causes pain. You may also need to have an X-ray. How is this treated? Treatment for this condition may include:  Avoiding any activity that causes pain and swelling.  Taking medicines. Anti-inflammatory medicines and corticosteroid injections may be used to reduce inflammation and relieve pain.  Wearing a splint.  Having surgery. This may be needed if other treatments do not work. Once the pain and swelling has gone down:  Physical therapy. This includes stretching and strengthening exercises.  Occupational therapy. This includes adjusting how you move your wrist. Follow these instructions at home: If you have a splint:  Wear the splint as told by your health care provider. Remove it only as told by your health care provider.  Loosen the splint if your fingers tingle, become numb, or turn cold and blue.  Keep the splint clean.  If the splint is not waterproof: ? Do not let it get wet. ? Cover it with a watertight covering when you take a bath or a shower. Managing pain, stiffness, and swelling   Avoid movements and activities that cause pain and swelling in the wrist area.  If directed, put ice on the painful area. This may be helpful after doing activities that involve the sore wrist. ? Put ice in a plastic bag. ? Place a towel between your skin and the bag. ? Leave the ice on for 20 minutes, 2-3 times a day.  Move your fingers often to avoid stiffness and to lessen swelling.  Raise (elevate) the injured area above the level of your heart while you are sitting or lying down. General instructions  Return to your normal activities as told by your health care provider. Ask your health care provider what activities are safe for you.  Take over-the-counter and prescription medicines only as told by your health care provider.  Keep all follow-up visits as told by your health  care provider. This is important. Contact a health care provider if:  Your pain medicine does not help.  Your pain gets worse.  You develop new symptoms. Summary  De Quervain's tenosynovitis is a condition that causes inflammation of the tendon on the thumb side of the wrist.  The condition occurs most often in women who are 56-3 years old.  The exact cause of this condition is not known. It may be associated with overuse of the hand and wrist.  Treatment starts with avoiding activity that causes pain or swelling in the wrist area. Other treatment may include wearing a splint and taking medicine. Sometimes, surgery is needed. This information is not intended to replace advice given to you by your health care provider. Make sure you discuss any questions you have with your health care provider. Document Revised: 12/24/2017 Document Reviewed: 06/01/2017 Elsevier Patient Education  [place].  Hand Exercises Hand exercises can be helpful for almost anyone. These exercises can strengthen the hands, improve flexibility and movement, and  increase blood flow to the hands. These results can make work and daily tasks easier. Hand exercises can be especially helpful for people who have joint pain from arthritis or have nerve damage from overuse (carpal tunnel syndrome). These exercises can also help people who have injured a hand. Exercises Most of these hand exercises are gentle stretching and motion exercises. It is usually safe to do them often throughout the day. Warming up your hands before exercise may help to reduce stiffness. You can do this with gentle massage or by placing your hands in warm water for 10-15 minutes. It is normal to feel some stretching, pulling, tightness, or mild discomfort as you begin new exercises. This will gradually improve. Stop an exercise right away if you feel sudden, severe pain or your pain gets worse. Ask your health care provider which exercises are  best for you. Knuckle bend or "claw" fist 1. Stand or sit with your arm, hand, and all five fingers pointed straight up. Make sure to keep your wrist straight during the exercise. 2. Gently bend your fingers down toward your palm until the tips of your fingers are touching the top of your palm. Keep your big knuckle straight and just bend the small knuckles in your fingers. 3. Hold this position for __________ seconds. 4. Straighten (extend) your fingers back to the starting position. Repeat this exercise 5-10 times with each hand. Full finger fist 1. Stand or sit with your arm, hand, and all five fingers pointed straight up. Make sure to keep your wrist straight during the exercise. 2. Gently bend your fingers into your palm until the tips of your fingers are touching the middle of your palm. 3. Hold this position for __________ seconds. 4. Extend your fingers back to the starting position, stretching every joint fully. Repeat this exercise 5-10 times with each hand. Straight fist 1. Stand or sit with your arm, hand, and all five fingers pointed straight up. Make sure to keep your wrist straight during the exercise. 2. Gently bend your fingers at the big knuckle, where your fingers meet your hand, and the middle knuckle. Keep the knuckle at the tips of your fingers straight and try to touch the bottom of your palm. 3. Hold this position for __________ seconds. 4. Extend your fingers back to the starting position, stretching every joint fully. Repeat this exercise 5-10 times with each hand. Tabletop 1. Stand or sit with your arm, hand, and all five fingers pointed straight up. Make sure to keep your wrist straight during the exercise. 2. Gently bend your fingers at the big knuckle, where your fingers meet your hand, as far down as you can while keeping the small knuckles in your fingers straight. Think of forming a tabletop with your fingers. 3. Hold this position for __________  seconds. 4. Extend your fingers back to the starting position, stretching every joint fully. Repeat this exercise 5-10 times with each hand. Finger spread 1. Place your hand flat on a table with your palm facing down. Make sure your wrist stays straight as you do this exercise. 2. Spread your fingers and thumb apart from each other as far as you can until you feel a gentle stretch. Hold this position for __________ seconds. 3. Bring your fingers and thumb tight together again. Hold this position for __________ seconds. Repeat this exercise 5-10 times with each hand. Making circles 1. Stand or sit with your arm, hand, and all five fingers pointed straight up. Make sure to  keep your wrist straight during the exercise. 2. Make a circle by touching the tip of your thumb to the tip of your index finger. 3. Hold for __________ seconds. Then open your hand wide. 4. Repeat this motion with your thumb and each finger on your hand. Repeat this exercise 5-10 times with each hand. Thumb motion 1. Sit with your forearm resting on a table and your wrist straight. Your thumb should be facing up toward the ceiling. Keep your fingers relaxed as you move your thumb. 2. Lift your thumb up as high as you can toward the ceiling. Hold for __________ seconds. 3. Bend your thumb across your palm as far as you can, reaching the tip of your thumb for the small finger (pinkie) side of your palm. Hold for __________ seconds. Repeat this exercise 5-10 times with each hand. Grip strengthening  1. Hold a stress ball or other soft ball in the middle of your hand. 2. Slowly increase the pressure, squeezing the ball as much as you can without causing pain. Think of bringing the tips of your fingers into the middle of your palm. All of your finger joints should bend when doing this exercise. 3. Hold your squeeze for __________ seconds, then relax. Repeat this exercise 5-10 times with each hand. Contact a health care provider  if:  Your hand pain or discomfort gets much worse when you do an exercise.  Your hand pain or discomfort does not improve within 2 hours after you exercise. If you have any of these problems, stop doing these exercises right away. Do not do them again unless your health care provider says that you can. Get help right away if:  You develop sudden, severe hand pain or swelling. If this happens, stop doing these exercises right away. Do not do them again unless your health care provider says that you can. This information is not intended to replace advice given to you by your health care provider. Make sure you discuss any questions you have with your health care provider. Document Revised: 10/14/2018 Document Reviewed: 06/24/2018 Elsevier Patient Education  [place].

## 2019-12-13 NOTE — Telephone Encounter (Signed)
I called patient and she said she will call us back to schedule her appointment when she has her schedule.

## 2019-12-28 ENCOUNTER — Encounter: Payer: Self-pay | Admitting: Internal Medicine

## 2019-12-28 DIAGNOSIS — M79641 Pain in right hand: Secondary | ICD-10-CM

## 2019-12-29 NOTE — Telephone Encounter (Signed)
I have placed the order for the referral to ortho - Dr Peggye Ley (hand specialist).  If any problems, let us know.

## 2019-12-29 NOTE — Telephone Encounter (Signed)
Called the patient and informed her that the referral was placed. Patient has no further questions and verbalized understanding.

## 2020-06-30 IMAGING — MG DIGITAL SCREENING BILAT W/ TOMO W/ CAD
8 series · 8 of 24 positions shown · non-contrast
Comparison: Previous exam(s).

CLINICAL DATA: Screening.

EXAM:
DIGITAL SCREENING BILATERAL MAMMOGRAM WITH TOMO AND CAD

[R CC synth-2D]
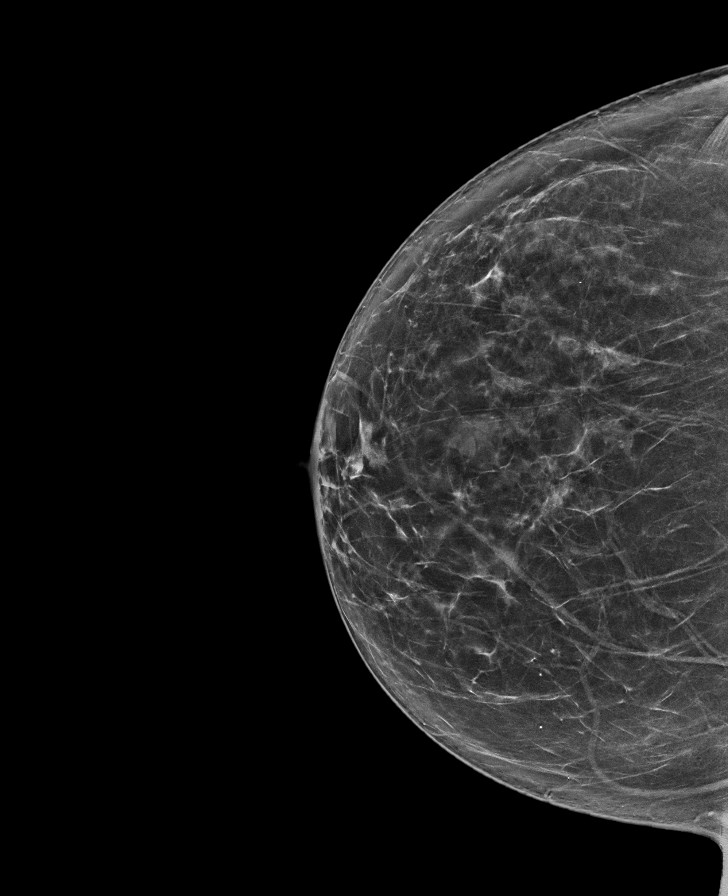

[L CC synth-2D]
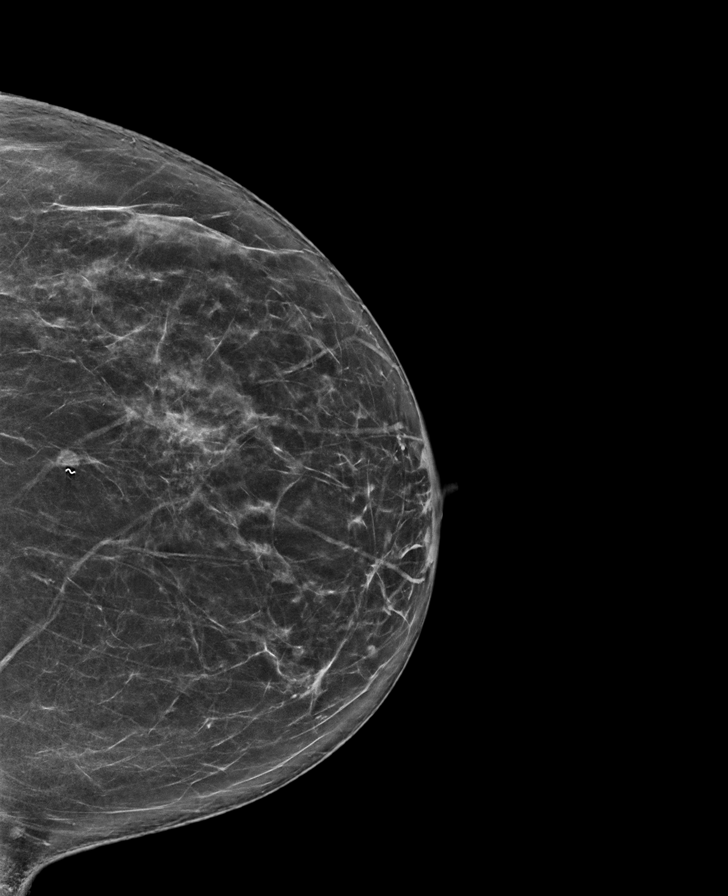

[L MLO synth-2D]
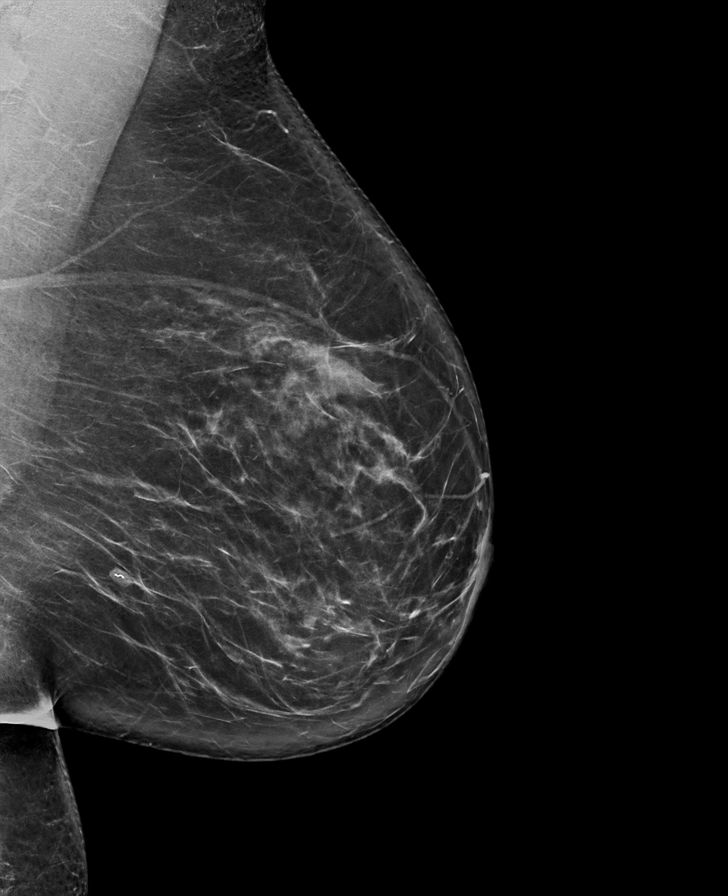

[R MLO synth-2D]
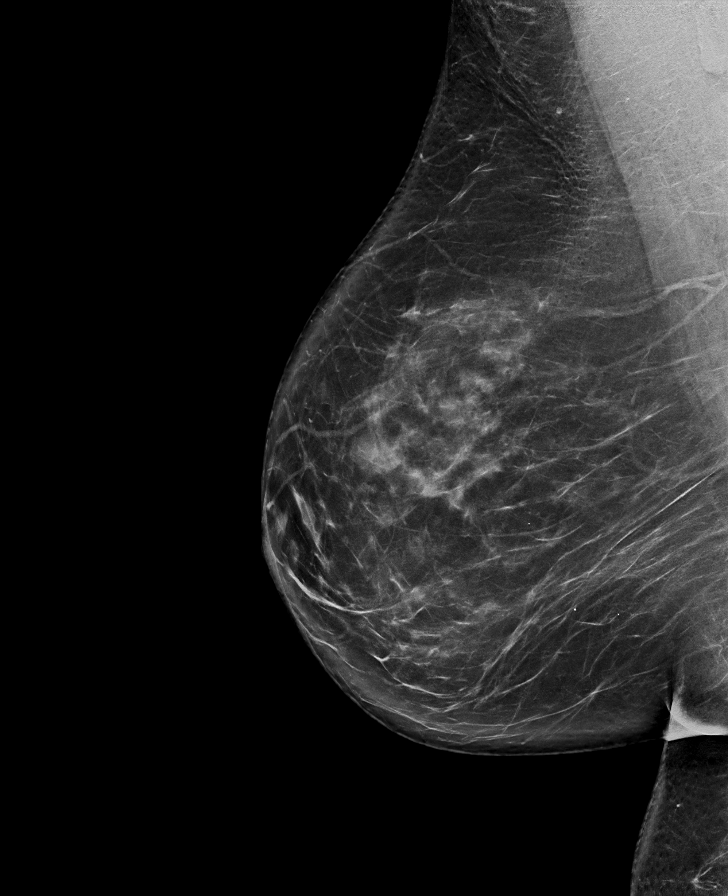

[L CC tomo · tomo slice 39/76.0]
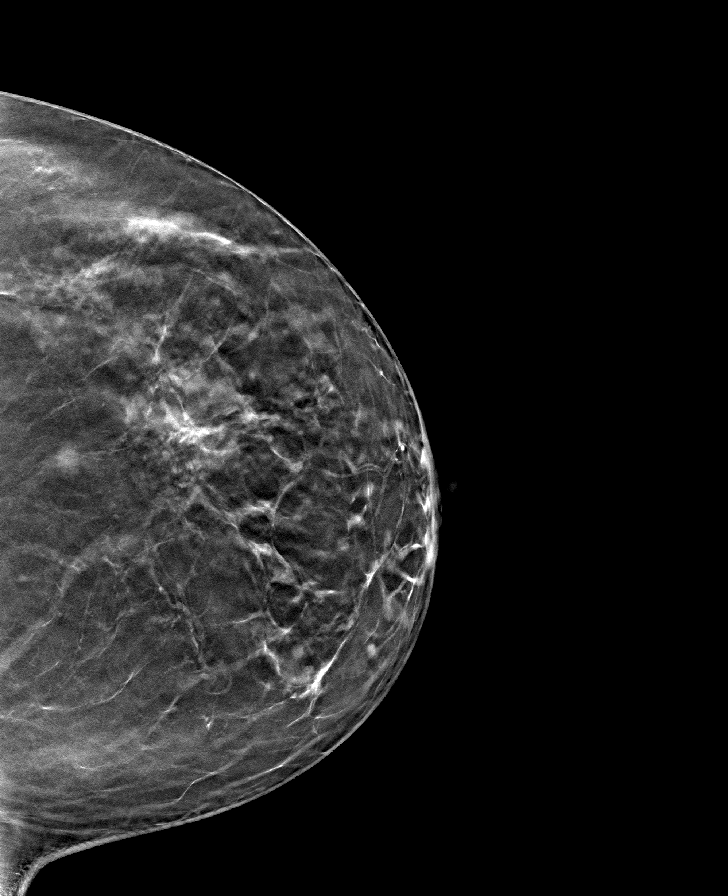

[L MLO tomo · tomo slice 45/88.0]
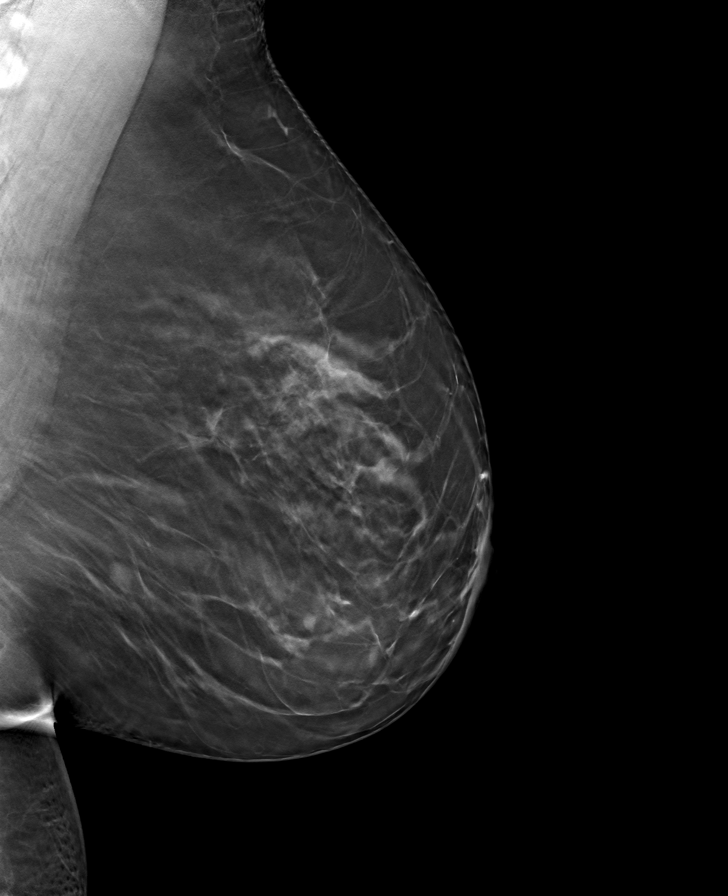

[R CC tomo · tomo slice 39/78.0]
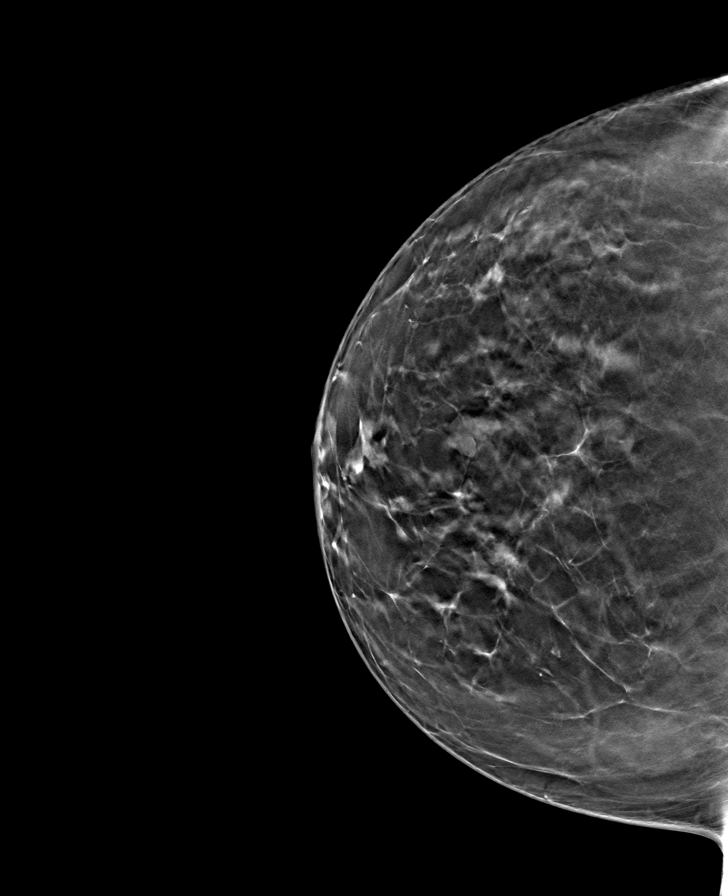

[R MLO tomo · tomo slice 48/95.0]
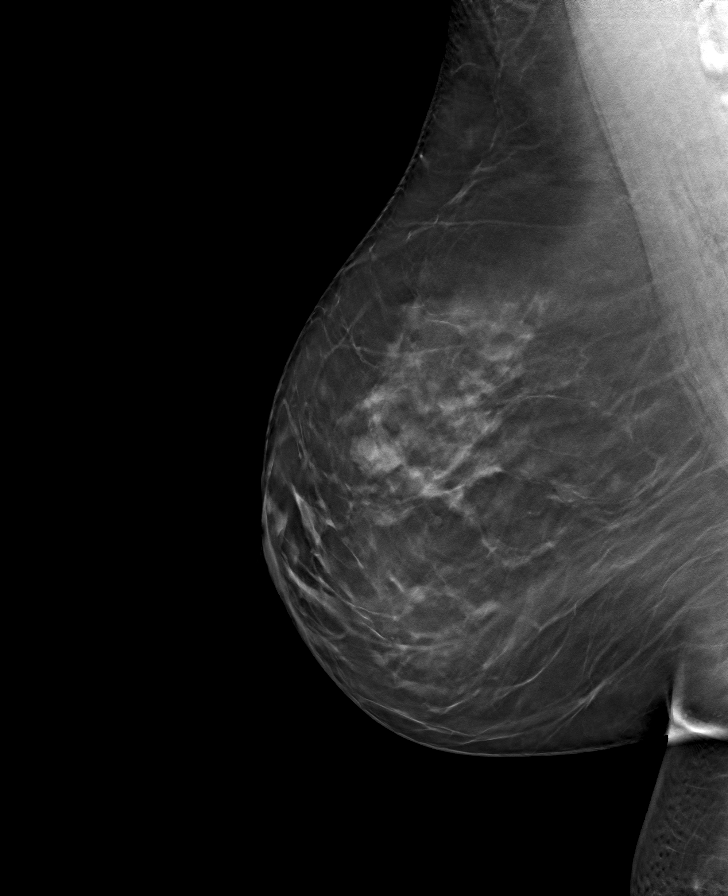

[8 of 24 positions shown; findings below may reference images not displayed]

ACR Breast Density Category b: There are scattered areas of
fibroglandular density.
FINDINGS: There are no findings suspicious for malignancy. Images were
processed with CAD.
IMPRESSION: No mammographic evidence of malignancy. A result letter of this
screening mammogram will be mailed directly to the patient.

RECOMMENDATION:
Screening mammogram in one year. (Code:[id number])

BI-RADS CATEGORY  1: Negative.

## 2020-08-17 ENCOUNTER — Ambulatory Visit (INDEPENDENT_AMBULATORY_CARE_PROVIDER_SITE_OTHER): Payer: Self-pay | Admitting: Obstetrics & Gynecology

## 2020-08-17 ENCOUNTER — Encounter: Payer: Self-pay | Admitting: Obstetrics & Gynecology

## 2020-08-17 ENCOUNTER — Other Ambulatory Visit: Payer: Self-pay

## 2020-08-17 ENCOUNTER — Other Ambulatory Visit (HOSPITAL_COMMUNITY)
Admission: RE | Admit: 2020-08-17 | Discharge: 2020-08-17 | Disposition: A | Discharge status: Home / Self Care | Payer: 59 | Source: Ambulatory Visit | Attending: Obstetrics & Gynecology | Admitting: Obstetrics & Gynecology

## 2020-08-17 VITALS — BP 110/70 | Ht 65.5 in | Wt 191.0 lb

## 2020-08-17 DIAGNOSIS — M545 Low back pain, unspecified: Secondary | ICD-10-CM

## 2020-08-17 DIAGNOSIS — Z202 Contact with and (suspected) exposure to infections with a predominantly sexual mode of transmission: Secondary | ICD-10-CM

## 2020-08-17 DIAGNOSIS — Z01419 Encounter for gynecological examination (general) (routine) without abnormal findings: Secondary | ICD-10-CM

## 2020-08-17 DIAGNOSIS — N951 Menopausal and female climacteric states: Secondary | ICD-10-CM

## 2020-08-17 DIAGNOSIS — Z1231 Encounter for screening mammogram for malignant neoplasm of breast: Secondary | ICD-10-CM

## 2020-08-17 DIAGNOSIS — G8929 Other chronic pain: Secondary | ICD-10-CM

## 2020-08-17 DIAGNOSIS — R232 Flushing: Secondary | ICD-10-CM

## 2020-08-17 DIAGNOSIS — M542 Cervicalgia: Secondary | ICD-10-CM

## 2020-08-17 MED ORDER — ESTRADIOL 0.1 MG/24HR TD PTTW: 1.0000 | MEDICATED_PATCH | TRANSDERMAL | 12 refills | Status: DC

## 2020-08-17 MED ORDER — PROGESTERONE MICRONIZED 100 MG PO CAPS: 100.0000 mg | ORAL_CAPSULE | Freq: Every day | ORAL | 12 refills | Status: DC

## 2020-08-17 MED ORDER — CYCLOBENZAPRINE HCL 5 MG PO TABS: 5.0000 mg | ORAL_TABLET | Freq: Every evening | ORAL | 2 refills | Status: DC | PRN

## 2020-08-17 NOTE — Progress Notes (Signed)
HPI:      [name] is a 55 y.o. G0P0000 who LMP was in the past, she presents today for her annual examination.  The patient has no complaints today. The patient is sexually active. Herlast pap: approximate date 2020 and was normal and last mammogram: approximate date 2021 and was normal.  The patient does perform self breast exams.  There is no notable family history of breast or ovarian cancer in her family. The patient is taking hormone replacement therapy. Patient denies post-menopausal vaginal bleeding.   The patient has regular exercise: yes. The patient denies current symptoms of depression.    GYN Hx: Last Colonoscopy:7 years ago. Normal.  Last DEXA: never ago.    PMHx: Past Medical History:  Diagnosis Date  . GERD (gastroesophageal reflux disease)   . Hypothyroid    Past Surgical History:  Procedure Laterality Date  . ABDOMINAL HYSTERECTOMY Bilateral 10/10/2016   Procedure: HYSTERECTOMY ABDOMINAL WITH BILATERAL SALPINGO OOPHERECTOMY;  Surgeon: Harlin Heys, MD;  Location: [facility] ORS;  Service: Gynecology;  Laterality: Bilateral;  . BREAST BIOPSY Left 2005   benign  . CERVICAL [place] BIOPSY  1990  . [place]  2012  . TUBAL LIGATION  1999   Family History  Problem Relation Age of Onset  . Diabetes Mother   . Hypothyroidism Mother   . Diabetes Maternal Uncle   . Dementia Maternal Grandmother   . Stroke Maternal Grandmother   . Diabetes Maternal Grandfather   . Alzheimer's disease Paternal Grandmother   . Hypothyroidism Son   . Breast cancer Neg Hx    Social History   Tobacco Use  . Smoking status: Never Smoker  . Smokeless tobacco: Never Used  Vaping Use  . Vaping Use: Never used  Substance Use Topics  . Alcohol use: Yes    Alcohol/week: 0.0 standard drinks    Comment: Occasional   . Drug use: No    Current Outpatient Medications:  .  Calcium-Magnesium-Vitamin D (CALCIUM 500 PO), Take by mouth., Disp: , Rfl:  .  Cholecalciferol (VITAMIN  D-3) 5000 units TABS, Take 5,000 Units by mouth daily with supper., Disp: , Rfl:  .  etodolac (LODINE) 400 MG tablet, Take 400 mg by mouth 2 (two) times daily., Disp: , Rfl:  .  ibuprofen (ADVIL,MOTRIN) 200 MG tablet, Take 3 tablets (600 mg total) by mouth every 6 (six) hours as needed for mild pain (for pain/headache.)., Disp: 30 tablet, Rfl: 0 .  levothyroxine (SYNTHROID, LEVOTHROID) 100 MCG tablet, Take 100 mcg by mouth daily before breakfast., Disp: , Rfl:  .  Omega-3 Fatty Acids (FISH OIL PO), Take by mouth., Disp: , Rfl:  .  progesterone (PROMETRIUM) 100 MG capsule, Take 1 capsule (100 mg total) by mouth daily., Disp: 30 capsule, Rfl: 12 .  cyanocobalamin 1000 MCG tablet, Take 1,000 mcg by mouth daily., Disp: , Rfl:  .  cyclobenzaprine (FLEXERIL) 5 MG tablet, Take 1 tablet (5 mg total) by mouth at bedtime as needed., Disp: 30 tablet, Rfl: 2 .  [START ON 08/20/2020] estradiol (VIVELLE-DOT) 0.1 MG/24HR patch, Place 1 patch (0.1 mg total) onto the skin 2 (two) times a week., Disp: 8 patch, Rfl: 12 Allergies: Aspirin, Erythromycin, Food, Metronidazole, and Sulfa antibiotics  Review of Systems  Constitutional: Negative for chills, fever and malaise/fatigue.  HENT: Negative for congestion, sinus pain and sore throat.   Eyes: Negative for blurred vision and pain.  Respiratory: Negative for cough and wheezing.   Cardiovascular: Negative for chest  pain and leg swelling.  Gastrointestinal: Negative for abdominal pain, constipation, diarrhea, heartburn, nausea and vomiting.  Genitourinary: Negative for dysuria, frequency, hematuria and urgency.  Musculoskeletal: Negative for back pain, joint pain, myalgias and neck pain.  Skin: Negative for itching and rash.  Neurological: Negative for dizziness, tremors and weakness.  Endo/Heme/Allergies: Does not bruise/bleed easily.  Psychiatric/Behavioral: Negative for depression. The patient is not nervous/anxious and does not have insomnia.      Objective: BP 110/70   Ht 5' 5.5" (1.664 m)   Wt 191 lb (86.6 kg)   LMP 09/23/2016 (Exact Date)   BMI 31.30 kg/m   Filed Weights   08/17/20 0802  Weight: 191 lb (86.6 kg)   Body mass index is 31.3 kg/m. Physical Exam Constitutional:      General: She is not in acute distress.    Appearance: She is well-developed.  Genitourinary:     Vulva, urethra, bladder, vagina and rectum normal.     No lesions in the vagina.     Genitourinary Comments: Vaginal cuff well healed     No vaginal bleeding.      Right Adnexa: not tender and no mass present.    Left Adnexa: not tender and no mass present.    Cervix is absent.     Uterus is absent.     Pelvic exam was performed with patient in the lithotomy position.  Breasts:     Right: No mass, skin change or tenderness.     Left: No mass, skin change or tenderness.    HENT:     Head: Normocephalic and atraumatic. No laceration.     Right Ear: Hearing normal.     Left Ear: Hearing normal.     Mouth/Throat:     Pharynx: Uvula midline.  Eyes:     Pupils: Pupils are equal, round, and reactive to light.  Neck:     Thyroid: No thyromegaly.  Cardiovascular:     Rate and Rhythm: Normal rate and regular rhythm.     Heart sounds: No murmur heard. No friction rub. No gallop.   Pulmonary:     Effort: Pulmonary effort is normal. No respiratory distress.     Breath sounds: Normal breath sounds. No wheezing.  Abdominal:     General: Bowel sounds are normal. There is no distension.     Palpations: Abdomen is soft.     Tenderness: There is no abdominal tenderness. There is no rebound.  Musculoskeletal:        General: Normal range of motion.     Cervical back: Normal range of motion and neck supple.  Neurological:     Mental Status: She is alert and oriented to person, place, and time.     Cranial Nerves: No cranial nerve deficit.  Skin:    General: Skin is warm and dry.  Psychiatric:        Judgment: Judgment normal.  Vitals  reviewed.     Assessment: Annual Exam 1. Women's annual routine gynecological examination   2. Encounter for screening mammogram for malignant neoplasm of breast   3. Possible exposure to STD   4. Chronic low back pain, unspecified back pain laterality, unspecified whether sciatica present   5. Cervicalgia   6. Vaginal dryness, menopausal   7. Vasomotor flushing     Plan:            1.  Vaginal Screening-  Pap smear schedule reviewed with patient  2. Breast screening- Exam annually and mammogram  recommended; pt desires to delay this scheduling as she is at a lapse for insurance at this time  3. Colonoscopy every 10 years, Hemoccult testing after age 50, pt wishes to defer stool testing  4. Labs managed by PCP  STD screening labs today per request  5. Counseling for hormonal therapy: no change in therapy today Est patch and prometrium (mostly for insomnia, as she is s/p hysterectomy) Counseled on pros and cons of extended HRT therapy              6. FRAX - FRAX score for assessing the 10 year probability for fracture calculated and discussed today.  Based on age and score today, DEXA is not currently scheduled.   7. Renewal Flexeril for prn use.  Counseled need to see ortho or specialist if this is an increasing problem of pain.     F/U  Return in about 1 year (around 08/17/2021) for Annual.  Barnett Applebaum, MD, [facility], [facility] 08/17/2020  8:29 AM

## 2020-08-17 NOTE — Patient Instructions (Signed)
PAP every three years Mammogram every year    Call [phone] to schedule at [place] Colonoscopy every 10 years Labs yearly (with PCP)  Thank you for choosing [facility]. As part of our ongoing efforts to improve patient experience, we would appreciate your feedback. Please fill out the short survey that you will receive by mail or MyChart. Your opinion is important to us! - Dr. Tachina Spoonemore  Recommendations to boost your immunity to prevent illness such as viral flu and colds, including covid19, are as follows:       - - -  Vitamin K2 and Vitamin D3  - - - Take Vitamin K2 at 200-300 mcg daily (usually 2-3 pills daily of the over the counter formulation). Take Vitamin D3 at 3000-4000 U daily (usually 3-4 pills daily of the over the counter formulation). Studies show that these two at high normal levels in your system are very effective in keeping your immunity so strong and protective that you will be unlikely to contract viral illness such as those listed above.  Dr Sieara Bremer  

## 2020-08-18 LAB — HEP, RPR, HIV PANEL
HIV Screen 4th Generation wRfx: NONREACTIVE
Hepatitis B Surface Ag: NEGATIVE
RPR Ser Ql: NONREACTIVE

## 2020-08-20 ENCOUNTER — Other Ambulatory Visit: Payer: Self-pay | Admitting: Obstetrics & Gynecology

## 2020-08-20 LAB — CERVICOVAGINAL ANCILLARY ONLY
Bacterial Vaginitis (gardnerella): POSITIVE — AB
Candida Glabrata: NEGATIVE
Candida Vaginitis: POSITIVE — AB
Chlamydia: NEGATIVE
Comment: NEGATIVE
Comment: NEGATIVE
Comment: NEGATIVE
Comment: NEGATIVE
Comment: NEGATIVE
Comment: NORMAL
Neisseria Gonorrhea: NEGATIVE
Trichomonas: NEGATIVE

## 2020-08-20 MED ORDER — CLINDAMYCIN PHOSPHATE 2 % VA CREA: 1.0000 | TOPICAL_CREAM | Freq: Every day | VAGINAL | 0 refills | Status: AC

## 2020-08-20 NOTE — Progress Notes (Signed)
D/w pt results from swab +BV and Yeast Neg STD  Pt desires tx of BV only at this time Poor tolerence to Flagyl in past Plan Clindamycin vag therapy x 7 days  Re-assess if no change in sx's  Barnett Applebaum, MD, [place], [facility] 08/20/2020  2:01 PM

## 2020-10-15 ENCOUNTER — Encounter: Payer: Self-pay | Admitting: Family

## 2020-10-15 ENCOUNTER — Other Ambulatory Visit: Payer: Self-pay

## 2020-10-15 ENCOUNTER — Ambulatory Visit (INDEPENDENT_AMBULATORY_CARE_PROVIDER_SITE_OTHER): Payer: 59 | Admitting: Family

## 2020-10-15 VITALS — BP 110/72 | HR 81 | Temp 97.6°F | Ht 65.5 in | Wt 190.0 lb

## 2020-10-15 DIAGNOSIS — Z23 Encounter for immunization: Secondary | ICD-10-CM | POA: Diagnosis not present

## 2020-10-15 DIAGNOSIS — E039 Hypothyroidism, unspecified: Secondary | ICD-10-CM

## 2020-10-15 DIAGNOSIS — Z1211 Encounter for screening for malignant neoplasm of colon: Secondary | ICD-10-CM | POA: Diagnosis not present

## 2020-10-15 DIAGNOSIS — H938X1 Other specified disorders of right ear: Secondary | ICD-10-CM

## 2020-10-15 DIAGNOSIS — Z1283 Encounter for screening for malignant neoplasm of skin: Secondary | ICD-10-CM | POA: Diagnosis not present

## 2020-10-15 DIAGNOSIS — Z1231 Encounter for screening mammogram for malignant neoplasm of breast: Secondary | ICD-10-CM

## 2020-10-15 DIAGNOSIS — Z136 Encounter for screening for cardiovascular disorders: Secondary | ICD-10-CM

## 2020-10-15 DIAGNOSIS — Z1159 Encounter for screening for other viral diseases: Secondary | ICD-10-CM

## 2020-10-15 DIAGNOSIS — Z13220 Encounter for screening for lipoid disorders: Secondary | ICD-10-CM

## 2020-10-15 NOTE — Addendum Note (Signed)
Addended by: Cheri Rous E on: 10/15/2020 02:22 PM   Modules accepted: Orders

## 2020-10-15 NOTE — Progress Notes (Signed)
Subjective:    Patient ID: Bethany Bailey, female    DOB: 08-Oct-1965, 55 y.o.   MRN: [id number]  CC: Bethany Bailey is a 55 y.o. female who presents today for follow up.   HPI: Right ear fullness and pressure, unchanged.  She has noticed this for 12 months, comes and goes.  Using sudafed without relief.No hearing loss.   Acpuncture. She had tinntis since resolved.   H/o seasonal allergies. She doesn't take claritin regularly.   No depression  Would like referral for endocrine and dermatology for annual follow up now that she has insurance.    HISTORY:  Past Medical History:  Diagnosis Date  . GERD (gastroesophageal reflux disease)   . Hypothyroid    Past Surgical History:  Procedure Laterality Date  . ABDOMINAL HYSTERECTOMY Bilateral 10/10/2016   Procedure: HYSTERECTOMY ABDOMINAL WITH BILATERAL SALPINGO OOPHERECTOMY;  Surgeon: Harlin Heys, MD;  Location: [facility] ORS;  Service: Gynecology;  Laterality: Bilateral;  . BREAST BIOPSY Left 2005   benign  . CERVICAL CONE BIOPSY  1990  . [place]  2012  . TUBAL LIGATION  1999   Family History  Problem Relation Age of Onset  . Diabetes Mother   . Hypothyroidism Mother   . Diabetes Maternal Uncle   . Dementia Maternal Grandmother   . Stroke Maternal Grandmother   . Diabetes Maternal Grandfather   . Alzheimer's disease Paternal Grandmother   . Hypothyroidism Son   . Breast cancer Neg Hx     Allergies: Aspirin, Erythromycin, Food, Metronidazole, and Sulfa antibiotics Current Outpatient Medications on File Prior to Visit  Medication Sig Dispense Refill  . Calcium-Magnesium-Vitamin D (CALCIUM 500 PO) Take by mouth.    . Cholecalciferol (VITAMIN D-3) 5000 units TABS Take 5,000 Units by mouth daily with supper.    . cyanocobalamin 1000 MCG tablet Take 1,000 mcg by mouth daily.    . cyclobenzaprine (FLEXERIL) 5 MG tablet Take 1 tablet (5 mg total) by mouth at bedtime as needed. 30 tablet 2  . estradiol (VIVELLE-DOT)  0.1 MG/24HR patch Place 1 patch (0.1 mg total) onto the skin 2 (two) times a week. 8 patch 12  . etodolac (LODINE) 400 MG tablet Take 400 mg by mouth 2 (two) times daily.    Marland Kitchen ibuprofen (ADVIL,MOTRIN) 200 MG tablet Take 3 tablets (600 mg total) by mouth every 6 (six) hours as needed for mild pain (for pain/headache.). 30 tablet 0  . levothyroxine (SYNTHROID, LEVOTHROID) 100 MCG tablet Take 100 mcg by mouth daily before breakfast.    . Omega-3 Fatty Acids (FISH OIL PO) Take by mouth.    . progesterone (PROMETRIUM) 100 MG capsule Take 1 capsule (100 mg total) by mouth daily. 30 capsule 12   No current facility-administered medications on file prior to visit.    Social History   Tobacco Use  . Smoking status: Never Smoker  . Smokeless tobacco: Never Used  Vaping Use  . Vaping Use: Never used  Substance Use Topics  . Alcohol use: Yes    Alcohol/week: 0.0 standard drinks    Comment: Occasional   . Drug use: No    Review of Systems  Constitutional: Negative for chills and fever.  HENT: Negative for congestion, ear discharge, ear pain and facial swelling.   Respiratory: Negative for cough.   Cardiovascular: Negative for chest pain and palpitations.  Gastrointestinal: Negative for nausea and vomiting.      Objective:    BP 110/72   Pulse 81  Temp 97.6 F (36.4 C)   Ht 5' 5.5" (1.664 m)   Wt 190 lb (86.2 kg)   LMP 09/23/2016 (Exact Date)   SpO2 99%   BMI 31.14 kg/m  BP Readings from Last 3 Encounters:  10/15/20 110/72  08/17/20 110/70  12/07/19 140/90   Wt Readings from Last 3 Encounters:  10/15/20 190 lb (86.2 kg)  08/17/20 191 lb (86.6 kg)  12/07/19 185 lb 12.8 oz (84.3 kg)    Physical Exam Vitals reviewed.  Constitutional:      Appearance: She is well-developed.  HENT:     Head: Normocephalic and atraumatic.     Right Ear: Hearing, tympanic membrane, ear canal and external ear normal. No decreased hearing noted. No drainage, swelling or tenderness. No middle  ear effusion. No foreign body. Tympanic membrane is not erythematous or bulging.     Left Ear: Hearing, tympanic membrane, ear canal and external ear normal. No decreased hearing noted. No drainage, swelling or tenderness.  No middle ear effusion. No foreign body. Tympanic membrane is not erythematous or bulging.     Nose: Nose normal. No rhinorrhea.     Right Sinus: No maxillary sinus tenderness or frontal sinus tenderness.     Left Sinus: No maxillary sinus tenderness or frontal sinus tenderness.     Mouth/Throat:     Pharynx: Uvula midline. No oropharyngeal exudate or posterior oropharyngeal erythema.     Tonsils: No tonsillar abscesses.  Eyes:     Conjunctiva/sclera: Conjunctivae normal.  Cardiovascular:     Rate and Rhythm: Regular rhythm.     Pulses: Normal pulses.     Heart sounds: Normal heart sounds.  Pulmonary:     Effort: Pulmonary effort is normal.     Breath sounds: Normal breath sounds. No wheezing, rhonchi or rales.  Lymphadenopathy:     Head:     Right side of head: No submental, submandibular, tonsillar, preauricular, posterior auricular or occipital adenopathy.     Left side of head: No submental, submandibular, tonsillar, preauricular, posterior auricular or occipital adenopathy.     Cervical: No cervical adenopathy.  Skin:    General: Skin is warm and dry.  Neurological:     Mental Status: She is alert.  Psychiatric:        Speech: Speech normal.        Behavior: Behavior normal.        Thought Content: Thought content normal.        Assessment & Plan:   Problem List Items Addressed This Visit      Endocrine   Hypothyroidism - Primary   Relevant Orders   Ambulatory referral to Endocrinology     Nervous and Auditory   Ear fullness, right    Benign exam. Failed conservative therapy. Referral to ENT      Relevant Orders   Ambulatory referral to ENT    Other Visit Diagnoses    Screen for colon cancer       Relevant Orders   Ambulatory referral to  Gastroenterology   Encounter for screening mammogram for malignant neoplasm of breast       Relevant Orders   MM 3D SCREEN BREAST BILATERAL   Skin exam, screening for cancer       Relevant Orders   Ambulatory referral to Dermatology   Encounter for hepatitis C screening test for low risk patient       Relevant Orders   Hepatitis C antibody   Encounter for lipid screening for cardiovascular disease  Relevant Orders   Comprehensive metabolic panel   Lipid panel       I am having Bethany Bailey maintain her levothyroxine, Vitamin D-3, ibuprofen, etodolac, cyanocobalamin, Calcium-Magnesium-Vitamin D (CALCIUM 500 PO), Omega-3 Fatty Acids (FISH OIL PO), cyclobenzaprine, estradiol, and progesterone.   No orders of the defined types were placed in this encounter.   Return precautions given.   Risks, benefits, and alternatives of the medications and treatment plan prescribed today were discussed, and patient expressed understanding.   Education regarding symptom management and diagnosis given to patient on AVS.  Continue to follow with Burnard Hawthorne, FNP for routine health maintenance.   Bethany Bailey and I agreed with plan.   Mable Paris, FNP

## 2020-10-15 NOTE — Assessment & Plan Note (Signed)
Benign exam. Failed conservative therapy. Referral to ENT

## 2020-10-15 NOTE — Patient Instructions (Signed)
Please call  and schedule your 3D mammogram as discussed.   [place]  [place], [place]   Referrals placed Let us know if you dont hear back within a week in regards to an appointment being scheduled.

## 2020-10-23 ENCOUNTER — Encounter: Payer: Self-pay | Admitting: Family

## 2020-10-24 ENCOUNTER — Other Ambulatory Visit: Payer: Self-pay | Admitting: Family

## 2020-10-24 DIAGNOSIS — E039 Hypothyroidism, unspecified: Secondary | ICD-10-CM

## 2020-10-24 MED ORDER — LEVOTHYROXINE SODIUM 100 MCG PO TABS
100.0000 ug | ORAL_TABLET | Freq: Every day | ORAL | 1 refills | Status: DC
Start: 2020-10-24 — End: 2021-10-30

## 2020-10-30 ENCOUNTER — Other Ambulatory Visit (INDEPENDENT_AMBULATORY_CARE_PROVIDER_SITE_OTHER): Payer: 59

## 2020-10-30 ENCOUNTER — Other Ambulatory Visit: Payer: Self-pay

## 2020-10-30 DIAGNOSIS — Z136 Encounter for screening for cardiovascular disorders: Secondary | ICD-10-CM

## 2020-10-30 DIAGNOSIS — Z13220 Encounter for screening for lipoid disorders: Secondary | ICD-10-CM

## 2020-10-30 DIAGNOSIS — Z1159 Encounter for screening for other viral diseases: Secondary | ICD-10-CM

## 2020-10-30 LAB — COMPREHENSIVE METABOLIC PANEL
ALT: 23 U/L (ref 0–35)
AST: 16 U/L (ref 0–37)
Albumin: 4 g/dL (ref 3.5–5.2)
Alkaline Phosphatase: 39 U/L (ref 39–117)
BUN: 21 mg/dL (ref 6–23)
CO2: 27 mEq/L (ref 19–32)
Calcium: 9 mg/dL (ref 8.4–10.5)
Chloride: 104 mEq/L (ref 96–112)
Creatinine, Ser: 0.66 mg/dL (ref 0.40–1.20)
GFR: 100.29 mL/min (ref >60.00)
Glucose, Bld: 86 mg/dL (ref 70–99)
Potassium: 4 mEq/L (ref 3.5–5.1)
Sodium: 139 mEq/L (ref 135–145)
Total Bilirubin: 0.3 mg/dL (ref 0.2–1.2)
Total Protein: 6.9 g/dL (ref 6.0–8.3)

## 2020-10-30 LAB — LIPID PANEL
Cholesterol: 189 mg/dL (ref 0–200)
HDL: 61.6 mg/dL (ref >39.00)
LDL Cholesterol: 99 mg/dL (ref 0–99)
NonHDL: 127.74
Total CHOL/HDL Ratio: 3
Triglycerides: 142 mg/dL (ref 0.0–149.0)
VLDL: 28.4 mg/dL (ref 0.0–40.0)

## 2020-10-31 LAB — HEPATITIS C ANTIBODY
Hepatitis C Ab: NONREACTIVE
SIGNAL TO CUT-OFF: 0 (ref <1.00)

## 2020-11-05 ENCOUNTER — Ambulatory Visit
Admission: RE | Admit: 2020-11-05 | Discharge: 2020-11-05 | Disposition: A | Discharge status: Home / Self Care | Payer: 59 | Source: Ambulatory Visit | Attending: Family | Admitting: Family

## 2020-11-05 ENCOUNTER — Other Ambulatory Visit: Payer: Self-pay

## 2020-11-05 DIAGNOSIS — Z1231 Encounter for screening mammogram for malignant neoplasm of breast: Secondary | ICD-10-CM | POA: Diagnosis present

## 2020-11-22 ENCOUNTER — Other Ambulatory Visit: Payer: Self-pay | Admitting: Obstetrics & Gynecology

## 2020-11-22 MED ORDER — DOTTI 0.1 MG/24HR TD PTTW: 1.0000 | MEDICATED_PATCH | TRANSDERMAL | 12 refills | Status: DC

## 2020-11-22 NOTE — Telephone Encounter (Signed)
Can you send in this for her

## 2020-12-17 ENCOUNTER — Other Ambulatory Visit: Payer: Self-pay | Admitting: Obstetrics & Gynecology

## 2020-12-17 DIAGNOSIS — M545 Low back pain, unspecified: Secondary | ICD-10-CM

## 2020-12-17 DIAGNOSIS — M542 Cervicalgia: Secondary | ICD-10-CM

## 2020-12-17 DIAGNOSIS — G8929 Other chronic pain: Secondary | ICD-10-CM

## 2020-12-31 ENCOUNTER — Ambulatory Visit (INDEPENDENT_AMBULATORY_CARE_PROVIDER_SITE_OTHER): Payer: 59 | Admitting: Endocrinology

## 2020-12-31 ENCOUNTER — Encounter: Payer: Self-pay | Admitting: Endocrinology

## 2020-12-31 ENCOUNTER — Other Ambulatory Visit: Payer: Self-pay

## 2020-12-31 VITALS — BP 130/76 | HR 83 | Ht 65.5 in | Wt 191.8 lb

## 2020-12-31 DIAGNOSIS — E039 Hypothyroidism, unspecified: Secondary | ICD-10-CM

## 2020-12-31 LAB — TSH: TSH: 0.65 u[IU]/mL (ref 0.35–4.50)

## 2020-12-31 LAB — T4, FREE: Free T4: 0.91 ng/dL (ref 0.60–1.60)

## 2020-12-31 NOTE — Patient Instructions (Addendum)
Please sign release of info from [facility].   Let's recheck the ultrasound.  you will receive a phone call, about a day and time for an appointment.   Blood tests are requested for you today.  We'll let you know about the results.   Please come back for a follow-up appointment in 1 year.

## 2020-12-31 NOTE — Progress Notes (Signed)
Subjective:    Patient ID: Bethany Bailey, female    DOB: 09/30/1965, 55 y.o.   MRN: [id number]  HPI Pt is referred by Mable Paris, NP, for hypothyroidism.  Pt reports hypothyroidism was dx'ed in 1998.  she has been on prescribed thyroid hormone therapy since dx.  she has never taken kelp or any other type of non-prescribed thyroid product.  she has never had thyroid surgery, or XRT to the neck.  she has never been on amiodarone or lithium.  Last [place] was done in 2012 in [place].  She has been on 100 mcg/d, x approx 5 years.   Past Medical History:  Diagnosis Date   GERD (gastroesophageal reflux disease)    Hypothyroid     Past Surgical History:  Procedure Laterality Date   ABDOMINAL HYSTERECTOMY Bilateral 10/10/2016   Procedure: HYSTERECTOMY ABDOMINAL WITH BILATERAL SALPINGO OOPHERECTOMY;  Surgeon: Harlin Heys, MD;  Location: [facility] ORS;  Service: Gynecology;  Laterality: Bilateral;   BREAST BIOPSY Left 2005   benign   CERVICAL CONE BIOPSY  1990   NOVASURE ABLATION  2012   TUBAL LIGATION  1999    Social History   Socioeconomic History   Marital status: Divorced    Spouse name: Not on file   Number of children: Not on file   Years of education: Not on file   Highest education level: Not on file  Occupational History   Not on file  Tobacco Use   Smoking status: Never   Smokeless tobacco: Never  Vaping Use   Vaping Use: Never used  Substance and Sexual Activity   Alcohol use: Yes    Alcohol/week: 0.0 standard drinks    Comment: Occasional    Drug use: No   Sexual activity: Yes    Partners: Male    Birth control/protection: Surgical    Comment: Multiple   Other Topics Concern   Not on file  Social History Narrative   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     Cholecalciferol (VITAMIN D-3) 5000 units TABS Take 5,000 Units by mouth daily with supper.     cyanocobalamin 1000 MCG tablet Take 1,000 mcg by mouth daily.     cyclobenzaprine (FLEXERIL) 5 MG tablet Take 1 tablet (5 mg total) by mouth at bedtime as needed. 30 tablet 2   DOTTI 0.1 MG/24HR patch Place 1 patch (0.1 mg total) onto the skin 2 (two) times a week. 8 patch 12   etodolac (LODINE) 400 MG tablet Take 400 mg by mouth 2 (two) times daily.     ibuprofen (ADVIL,MOTRIN) 200 MG tablet Take 3 tablets (600 mg total) by mouth every 6 (six) hours as needed for mild pain (for pain/headache.). 30 tablet 0   levothyroxine (SYNTHROID) 100 MCG tablet Take 1 tablet (100 mcg total) by mouth daily before breakfast. 90 tablet 1   Omega-3 Fatty Acids (FISH OIL PO) Take by mouth.  progesterone (PROMETRIUM) 100 MG capsule Take 1 capsule (100 mg total) by mouth daily. 30 capsule 12   No current facility-administered medications on file prior to visit.    Allergies  Allergen Reactions   Aspirin Other (See Comments)    Ears ring   Erythromycin Nausea And Vomiting   Food     Wheat-coughing/sneezing & joint pain.   Metronidazole Nausea Only    Paresthesias, dizziness   Sulfa Antibiotics Rash    Family History  Problem Relation Age of Onset   Diabetes Mother    Hypothyroidism Mother    Thyroid disease Mother    Dementia Maternal Grandmother    Stroke Maternal Grandmother    Diabetes Maternal Grandfather    Alzheimer's disease Paternal Grandmother    Hypothyroidism Son    Diabetes Maternal Uncle    Breast cancer Neg Hx     BP 130/76 (BP Location: Right Arm, Patient Position: Sitting, Cuff Size:  Large)   Pulse 83   Ht 5' 5.5" (1.664 m)   Wt 191 lb 12.8 oz (87 kg)   LMP 09/23/2016 (Exact Date)   SpO2 99%   BMI 31.43 kg/m   Review of Systems denies depression, weight gain, constipation, numbness, cold intolerance, and dry skin.      Objective:   Physical Exam VS: see vs page GEN: no distress HEAD: head: no deformity eyes: no periorbital swelling, no proptosis external nose and ears are normal NECK: thyroid is slightly and diffusely enlarged CHEST WALL: no deformity LUNGS: clear to auscultation CV: reg rate and rhythm, no murmur.  MUSCULOSKELETAL: gait is normal and steady EXTEMITIES: no deformity.  no leg edema NEURO:  readily moves all 4's.  sensation is intact to touch on all 4's SKIN:  Normal texture and temperature.  No rash or suspicious lesion is visible.   NODES:  None palpable at the neck.   PSYCH: alert, well-oriented.  Does not appear anxious nor depressed.    Lab Results  Component Value Date   TSH 0.65 12/31/2020   T3TOTAL 67 (L) 05/26/2018       Assessment & Plan:  Hypothyroidism: well-controlled.  Please continue the same synthroid.   Thyroid enlargement: recheck [place].  Patient Instructions  Please sign release of info from [facility].   Let's recheck the ultrasound.  you will receive a phone call, about a day and time for an appointment.   Blood tests are requested for you today.  We'll let you know about the results.   Please come back for a follow-up appointment in 1 year.

## 2021-01-03 ENCOUNTER — Other Ambulatory Visit: Payer: Self-pay | Admitting: Endocrinology

## 2021-01-03 DIAGNOSIS — E039 Hypothyroidism, unspecified: Secondary | ICD-10-CM

## 2021-01-03 MED ORDER — SYNTHROID 100 MCG PO TABS: 100.0000 ug | ORAL_TABLET | Freq: Every day | ORAL | 3 refills | Status: DC

## 2021-01-31 ENCOUNTER — Ambulatory Visit
Admission: RE | Admit: 2021-01-31 | Discharge: 2021-01-31 | Disposition: A | Discharge status: Home / Self Care | Payer: 59 | Source: Ambulatory Visit | Attending: Endocrinology | Admitting: Endocrinology

## 2021-01-31 ENCOUNTER — Other Ambulatory Visit: Payer: Self-pay

## 2021-01-31 DIAGNOSIS — E039 Hypothyroidism, unspecified: Secondary | ICD-10-CM | POA: Insufficient documentation

## 2021-03-20 ENCOUNTER — Ambulatory Visit: Payer: 59 | Admitting: Family

## 2021-08-19 ENCOUNTER — Ambulatory Visit: Payer: Self-pay | Admitting: Licensed Practical Nurse

## 2021-08-26 ENCOUNTER — Ambulatory Visit (INDEPENDENT_AMBULATORY_CARE_PROVIDER_SITE_OTHER): Payer: 59 | Admitting: Licensed Practical Nurse

## 2021-08-26 ENCOUNTER — Other Ambulatory Visit: Payer: Self-pay | Admitting: Licensed Practical Nurse

## 2021-08-26 ENCOUNTER — Other Ambulatory Visit: Payer: Self-pay

## 2021-08-26 ENCOUNTER — Other Ambulatory Visit (HOSPITAL_COMMUNITY)
Admission: RE | Admit: 2021-08-26 | Discharge: 2021-08-26 | Disposition: A | Discharge status: Home / Self Care | Payer: 59 | Source: Ambulatory Visit | Attending: Licensed Practical Nurse | Admitting: Licensed Practical Nurse

## 2021-08-26 VITALS — BP 122/62 | Ht 65.0 in | Wt 191.0 lb

## 2021-08-26 DIAGNOSIS — Z13220 Encounter for screening for lipoid disorders: Secondary | ICD-10-CM

## 2021-08-26 DIAGNOSIS — Z113 Encounter for screening for infections with a predominantly sexual mode of transmission: Secondary | ICD-10-CM

## 2021-08-26 DIAGNOSIS — Z1211 Encounter for screening for malignant neoplasm of colon: Secondary | ICD-10-CM

## 2021-08-26 DIAGNOSIS — Z01419 Encounter for gynecological examination (general) (routine) without abnormal findings: Secondary | ICD-10-CM | POA: Diagnosis present

## 2021-08-26 DIAGNOSIS — Z124 Encounter for screening for malignant neoplasm of cervix: Secondary | ICD-10-CM | POA: Diagnosis present

## 2021-08-26 DIAGNOSIS — Z131 Encounter for screening for diabetes mellitus: Secondary | ICD-10-CM | POA: Diagnosis not present

## 2021-08-26 DIAGNOSIS — Z1231 Encounter for screening mammogram for malignant neoplasm of breast: Secondary | ICD-10-CM

## 2021-08-26 NOTE — Progress Notes (Signed)
Gynecology Annual Exam  PCP: Burnard Hawthorne, FNP, has not seen her in a long time  Chief Complaint:  Chief Complaint  Patient presents with   Gynecologic Exam    History of Present Illness:Patient is a 56 y.o. G0P0000 presents for annual exam. The patient has no complaints today. Desires to remain on hormonal medications prescribed by Dr Kenton Kingfisher   LMP: Patient's last menstrual period was 09/23/2016 (exact date). Hysterectomy   The patient is sexually active with 1 female partner. She denies dyspareunia.  The patient does occasionally perform self breast exams.  There is no notable family history of breast or ovarian cancer in her family.  The patient wears seatbelts: yes.   The patient has regular exercise:  plans to start a routine .  Has made dietary changes, increased vegetables and decrease sugars/carbs.   The patient denies current symptoms of depression.   Admits to depression in the past.  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  Lives a lon with her dogs Wears glasses, has annual eye exams Last saw dentist years ago See derm annually  Sees Dr Loanne Drilling in May for thyroid management   Review of Systems: Review of Systems  Constitutional: Negative.   Respiratory: Negative.    Cardiovascular: Negative.   Gastrointestinal: Negative.   Genitourinary: Negative.   Skin: Negative.   Neurological: Negative.   Endo/Heme/Allergies: Negative.   Psychiatric/Behavioral: Negative.     Past Medical History:  Patient Active Problem List   Diagnosis Date Noted   Ear fullness, right 10/15/2020   Urinary urgency 04/11/2019   Obesity (BMI 30.0-34.9) 03/02/2018   Spondylolisthesis at L5-S1 level 03/02/2018   Depression 02/03/2018   Cognitive change 09/14/2017   Bacterial vaginosis 01/26/2017   Hot flashes 01/14/2017   Menopause 01/14/2017   Postconcussive syndrome 01/02/2017   Impingement syndrome, shoulder, right 12/05/2016   Post-operative  state 10/10/2016   Hypothyroidism 02/07/2016   Well woman exam with routine gynecological exam 02/07/2016   High risk heterosexual behavior 01/23/2015   Gastritis determined by endoscopy 11/15/2013   Heartburn 11/15/2013   Hiatal hernia 11/15/2013    Past Surgical History:  Past Surgical History:  Procedure Laterality Date   ABDOMINAL HYSTERECTOMY Bilateral 10/10/2016   Procedure: HYSTERECTOMY ABDOMINAL WITH BILATERAL SALPINGO OOPHERECTOMY;  Surgeon: Harlin Heys, MD;  Location: [facility] ORS;  Service: Gynecology;  Laterality: Bilateral;   BREAST BIOPSY Left 2005   benign   CERVICAL CONE BIOPSY  1990   [place]  2012   TUBAL LIGATION  1999    Gynecologic History:  Patient's last menstrual period was 09/23/2016 (exact date). Last Pap: Results were: 2020 no abnormalities  Last mammogram: 2022 Results were:  WNL  Obstetric History: G0P0000  Family History:  Family History  Problem Relation Age of Onset   Diabetes Mother    Hypothyroidism Mother    Thyroid disease Mother    Dementia Maternal Grandmother    Stroke Maternal Grandmother    Diabetes Maternal Grandfather    Alzheimer's disease Paternal Grandmother    Hypothyroidism Son    Diabetes Maternal Uncle    Breast cancer Neg Hx     Social History:  Social History   Socioeconomic History   Marital status: Divorced    Spouse name: Not on file   Number of children: Not on file   Years of education: Not on file   Highest education level: Not on file  Occupational History  Not on file  Tobacco Use   Smoking status: Never   Smokeless tobacco: Never  Vaping Use   Vaping Use: Never used  Substance and Sexual Activity   Alcohol use: Yes    Alcohol/week: 0.0 standard drinks    Comment: Occasional    Drug use: No   Sexual activity: Yes    Partners: Male    Birth control/protection: Surgical    Comment: Multiple   Other Topics Concern   Not on file  Social History Narrative   Works at [employer]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   Metronidazole Nausea Only    Paresthesias, dizziness   Sulfa Antibiotics Rash    Medications: Prior to Admission medications   Medication Sig Start Date End Date Taking? Authorizing Provider  Calcium-Magnesium-Vitamin D (CALCIUM 500 PO) Take by mouth.   Yes [provider]  Cholecalciferol (VITAMIN D-3) 5000 units TABS Take 5,000 Units by mouth daily with supper.   Yes [provider]  cyanocobalamin 1000 MCG tablet Take 1,000 mcg by mouth daily.   Yes [provider]  cyclobenzaprine (FLEXERIL) 5 MG tablet Take 1 tablet (5 mg total) by mouth at bedtime as needed. 08/17/20  Yes Gae Dry, MD  DOTTI 0.1 MG/24HR patch Place 1 patch (0.1 mg total) onto the skin 2 (two) times a week. 11/22/20  Yes Gae Dry, MD  etodolac (LODINE) 400 MG tablet Take 400 mg by mouth 2 (two) times daily.   Yes [provider]  ibuprofen (ADVIL,MOTRIN) 200 MG tablet Take 3 tablets (600 mg total) by mouth every 6 (six) hours as needed for mild pain (for pain/headache.). 10/12/16  Yes Rubie Maid, MD  levothyroxine (SYNTHROID) 100 MCG tablet Take 1 tablet (100 mcg total) by mouth daily before breakfast. 10/24/20  Yes Arnett, Yvetta Coder, FNP  Omega-3 Fatty Acids (FISH OIL PO) Take by mouth.   Yes [provider]  progesterone (PROMETRIUM) 100 MG capsule Take 1 capsule (100 mg total) by mouth daily. 08/17/20  Yes Gae Dry, MD   SYNTHROID 100 MCG tablet Take 1 tablet (100 mcg total) by mouth daily before breakfast. 01/03/21  Yes Renato Shin, MD    Physical Exam Vitals: Blood pressure 122/62, height 5\' 5"  (1.651 m), weight 191 lb (86.6 kg), last menstrual period 09/23/2016.  General: NAD HEENT: normocephalic, anicteric Thyroid: no enlargement, no palpable nodules Pulmonary: No increased work of breathing, CTAB Cardiovascular: RRR, distal pulses 2+ Breast: Breast symmetrical, no tenderness, no palpable nodules or masses, no skin or nipple retraction present, no nipple discharge.  No axillary or supraclavicular lymphadenopathy. Abdomen: NABS, soft, non-tender, non-distended.  Umbilicus without lesions.  No hepatomegaly, splenomegaly or masses palpable. No evidence of hernia  Genitourinary:  External: Normal external female genitalia.  Normal urethral meatus, normal Bartholin's and Skene's glands.    Vagina: Pink, moist, creamy white discharge present  Cervix: Not present   Uterus: Not present  Adnexa: ovaries non-enlarged, no adnexal masses  Rectal: deferred  Lymphatic: no evidence of inguinal lymphadenopathy Extremities: no edema, erythema, or tenderness Neurologic: Grossly intact Psychiatric: mood appropriate, affect full   Assessment: 56 y.o. G0P0000 routine annual exam  Plan: Problem List Items Addressed This Visit   None Visit Diagnoses     Well woman exam    -  Primary   Relevant Orders   CBC w/Diff/Platelet   HEP, RPR, HIV Panel   Hepatitis C antibody   Cytology - PAP   Lipid panel   Comprehensive metabolic panel   Hemoglobin A1c   Screening examination for venereal disease       Relevant Orders   HEP, RPR, HIV Panel   Hepatitis C antibody   Screening cholesterol level       Relevant Orders   Lipid panel   Screening for diabetes mellitus       Relevant Orders   Hemoglobin A1c   Cervical cancer screening       Relevant Orders   Cytology - PAP       1) Mammogram - recommend  yearly screening mammogram.  Mammogram Was ordered today  2) STI screening  wasoffered and accepted  3) ASCCP guidelines and rational discussed.  Patient opts for every 5 years screening interval   4) Routine healthcare maintenance including cholesterol, diabetes screening discussed Ordered today  6) Colonoscopy ordered.  Screening recommended starting at age 55 for average risk individuals, age 73 for individuals deemed at increased risk (including African Americans) and recommended to continue until age 33.  For patient age 15-85 individualized approach is recommended.  Gold standard screening is via colonoscopy, Cologuard screening is an acceptable alternative for patient unwilling or unable to undergo colonoscopy.  "Colorectal cancer screening for average?risk adults: 2018 guideline update from the American Cancer Society"CA: A Cancer Journal for Clinicians: Dec 03, 2016   7) RTC 1 year   Roberto Scales, [place], [facility] 08/26/2021, 9:07 AM

## 2021-08-27 ENCOUNTER — Other Ambulatory Visit: Payer: Self-pay

## 2021-08-27 DIAGNOSIS — Z1211 Encounter for screening for malignant neoplasm of colon: Secondary | ICD-10-CM

## 2021-08-27 LAB — CBC WITH DIFFERENTIAL/PLATELET
Basophils Absolute: 0.1 10*3/uL (ref 0.0–0.2)
Basos: 1 %
EOS (ABSOLUTE): 0.1 10*3/uL (ref 0.0–0.4)
Eos: 1 %
Hematocrit: 39.1 % (ref 34.0–46.6)
Hemoglobin: 13.1 g/dL (ref 11.1–15.9)
Immature Grans (Abs): 0 10*3/uL (ref 0.0–0.1)
Immature Granulocytes: 0 %
Lymphocytes Absolute: 2.5 10*3/uL (ref 0.7–3.1)
Lymphs: 32 %
MCH: 30 pg (ref 26.6–33.0)
MCHC: 33.5 g/dL (ref 31.5–35.7)
MCV: 90 fL (ref 79–97)
Monocytes Absolute: 0.6 10*3/uL (ref 0.1–0.9)
Monocytes: 7 %
Neutrophils Absolute: 4.6 10*3/uL (ref 1.4–7.0)
Neutrophils: 59 %
Platelets: 232 10*3/uL (ref 150–450)
RBC: 4.36 x10E6/uL (ref 3.77–5.28)
RDW: 12.5 % (ref 11.7–15.4)
WBC: 7.8 10*3/uL (ref 3.4–10.8)

## 2021-08-27 LAB — HEP, RPR, HIV PANEL
HIV Screen 4th Generation wRfx: NONREACTIVE
Hepatitis B Surface Ag: NEGATIVE
RPR Ser Ql: NONREACTIVE

## 2021-08-27 LAB — COMPREHENSIVE METABOLIC PANEL
ALT: 25 IU/L (ref 0–32)
AST: 21 IU/L (ref 0–40)
Albumin/Globulin Ratio: 2 (ref 1.2–2.2)
Albumin: 4.5 g/dL (ref 3.8–4.9)
Alkaline Phosphatase: 49 IU/L (ref 44–121)
BUN/Creatinine Ratio: 29 — ABNORMAL HIGH (ref 9–23)
BUN: 22 mg/dL (ref 6–24)
Bilirubin Total: 0.3 mg/dL (ref 0.0–1.2)
CO2: 23 mmol/L (ref 20–29)
Calcium: 8.9 mg/dL (ref 8.7–10.2)
Chloride: 105 mmol/L (ref 96–106)
Creatinine, Ser: 0.77 mg/dL (ref 0.57–1.00)
Globulin, Total: 2.2 g/dL (ref 1.5–4.5)
Glucose: 95 mg/dL (ref 70–99)
Potassium: 4.3 mmol/L (ref 3.5–5.2)
Sodium: 139 mmol/L (ref 134–144)
Total Protein: 6.7 g/dL (ref 6.0–8.5)
eGFR: 92 mL/min/{1.73_m2} (ref >59)

## 2021-08-27 LAB — LIPID PANEL
Chol/HDL Ratio: 3.4 ratio (ref 0.0–4.4)
Cholesterol, Total: 223 mg/dL — ABNORMAL HIGH (ref 100–199)
HDL: 65 mg/dL (ref >39)
LDL Chol Calc (NIH): 135 mg/dL — ABNORMAL HIGH (ref 0–99)
Triglycerides: 128 mg/dL (ref 0–149)
VLDL Cholesterol Cal: 23 mg/dL (ref 5–40)

## 2021-08-27 LAB — HEPATITIS C ANTIBODY: Hep C Virus Ab: NONREACTIVE

## 2021-08-27 LAB — HEMOGLOBIN A1C
Est. average glucose Bld gHb Est-mCnc: 114 mg/dL
Hgb A1c MFr Bld: 5.6 % (ref 4.8–5.6)

## 2021-08-27 MED ORDER — PEG 3350-KCL-NA BICARB-NACL 420 G PO SOLR: 4000.0000 mL | Freq: Once | ORAL | 0 refills | Status: AC

## 2021-08-27 NOTE — Progress Notes (Signed)
Gastroenterology Pre-Procedure Review  Request Date: 09/27/2021 Requesting Physician: Dr. Marius Ditch  PATIENT REVIEW QUESTIONS: The patient responded to the following health history questions as indicated:    1. Are you having any GI issues?  Diarrhea, sometimes 2. Do you have a personal history of Polyps? no 3. Do you have a family history of Colon Cancer or Polyps? no 4. Diabetes Mellitus? no 5. Joint replacements in the past 12 months?no 6. Major health problems in the past 3 months?no 7. Any artificial heart valves, MVP, or defibrillator?no    MEDICATIONS & ALLERGIES:    Patient reports the following regarding taking any anticoagulation/antiplatelet therapy:   Plavix, Coumadin, Eliquis, Xarelto, Lovenox, Pradaxa, Brilinta, or Effient? no Aspirin? no  Patient confirms/reports the following medications:  Current Outpatient Medications  Medication Sig Dispense Refill   Calcium-Magnesium-Vitamin D (CALCIUM 500 PO) Take by mouth.     Cholecalciferol (VITAMIN D-3) 5000 units TABS Take 5,000 Units by mouth daily with supper.     cyanocobalamin 1000 MCG tablet Take 1,000 mcg by mouth daily.     cyclobenzaprine (FLEXERIL) 5 MG tablet Take 1 tablet (5 mg total) by mouth at bedtime as needed. 30 tablet 2   DOTTI 0.1 MG/24HR patch Place 1 patch (0.1 mg total) onto the skin 2 (two) times a week. 8 patch 12   etodolac (LODINE) 400 MG tablet Take 400 mg by mouth 2 (two) times daily.     ibuprofen (ADVIL,MOTRIN) 200 MG tablet Take 3 tablets (600 mg total) by mouth every 6 (six) hours as needed for mild pain (for pain/headache.). 30 tablet 0   levothyroxine (SYNTHROID) 100 MCG tablet Take 1 tablet (100 mcg total) by mouth daily before breakfast. 90 tablet 1   Omega-3 Fatty Acids (FISH OIL PO) Take by mouth.     progesterone (PROMETRIUM) 100 MG capsule Take 1 capsule (100 mg total) by mouth daily. 30 capsule 12   SYNTHROID 100 MCG tablet Take 1 tablet (100 mcg total) by mouth daily before breakfast. 90  tablet 3   No current facility-administered medications for this visit.    Patient confirms/reports the following allergies:  Allergies  Allergen Reactions   Aspirin Other (See Comments)    Ears ring   Erythromycin Nausea And Vomiting   Food     Wheat-coughing/sneezing & joint pain.   Metronidazole Nausea Only    Paresthesias, dizziness   Sulfa Antibiotics Rash    No orders of the defined types were placed in this encounter.   AUTHORIZATION INFORMATION Primary Insurance: 1D#: Group #:  Secondary Insurance: 1D#: Group #:  SCHEDULE INFORMATION: Date: 09/27/2021 Time: Location: [place]

## 2021-08-29 ENCOUNTER — Encounter: Payer: Self-pay | Admitting: Licensed Practical Nurse

## 2021-08-29 ENCOUNTER — Other Ambulatory Visit: Payer: Self-pay

## 2021-08-29 ENCOUNTER — Other Ambulatory Visit: Payer: Self-pay | Admitting: Licensed Practical Nurse

## 2021-08-29 DIAGNOSIS — N951 Menopausal and female climacteric states: Secondary | ICD-10-CM

## 2021-08-29 MED ORDER — DOTTI 0.1 MG/24HR TD PTTW: 1.0000 | MEDICATED_PATCH | TRANSDERMAL | 12 refills | Status: DC

## 2021-08-30 ENCOUNTER — Other Ambulatory Visit: Payer: Self-pay | Admitting: Licensed Practical Nurse

## 2021-08-30 DIAGNOSIS — R232 Flushing: Secondary | ICD-10-CM

## 2021-08-30 DIAGNOSIS — N951 Menopausal and female climacteric states: Secondary | ICD-10-CM

## 2021-08-30 LAB — CYTOLOGY - PAP
Chlamydia: NEGATIVE
Comment: NEGATIVE
Comment: NEGATIVE
Comment: NEGATIVE
Comment: NORMAL
Diagnosis: NEGATIVE
High risk HPV: NEGATIVE
Neisseria Gonorrhea: NEGATIVE
Trichomonas: NEGATIVE

## 2021-08-30 MED ORDER — PROGESTERONE MICRONIZED 100 MG PO CAPS: 100.0000 mg | ORAL_CAPSULE | Freq: Every day | ORAL | 12 refills | Status: DC

## 2021-09-26 ENCOUNTER — Encounter: Payer: Self-pay | Admitting: Gastroenterology

## 2021-09-27 ENCOUNTER — Encounter: Payer: Self-pay | Admitting: Gastroenterology

## 2021-09-27 ENCOUNTER — Ambulatory Visit
Admission: RE | Admit: 2021-09-27 | Discharge: 2021-09-27 | Disposition: A | Discharge status: Home / Self Care | Payer: 59 | Attending: Gastroenterology | Admitting: Gastroenterology

## 2021-09-27 ENCOUNTER — Other Ambulatory Visit: Payer: Self-pay

## 2021-09-27 ENCOUNTER — Ambulatory Visit: Payer: 59 | Admitting: Anesthesiology

## 2021-09-27 ENCOUNTER — Encounter
Admission: RE | Disposition: A | Discharge status: Home / Self Care | Payer: Self-pay | Source: Home / Self Care | Attending: Gastroenterology

## 2021-09-27 DIAGNOSIS — Z1211 Encounter for screening for malignant neoplasm of colon: Secondary | ICD-10-CM | POA: Diagnosis not present

## 2021-09-27 DIAGNOSIS — E669 Obesity, unspecified: Secondary | ICD-10-CM | POA: Diagnosis not present

## 2021-09-27 DIAGNOSIS — Z6832 Body mass index (BMI) 32.0-32.9, adult: Secondary | ICD-10-CM | POA: Insufficient documentation

## 2021-09-27 DIAGNOSIS — K635 Polyp of colon: Secondary | ICD-10-CM

## 2021-09-27 DIAGNOSIS — E039 Hypothyroidism, unspecified: Secondary | ICD-10-CM | POA: Insufficient documentation

## 2021-09-27 HISTORY — PX: COLONOSCOPY WITH PROPOFOL: SHX5780

## 2021-09-27 SURGERY — COLONOSCOPY WITH PROPOFOL
Anesthesia: General

## 2021-09-27 MED ORDER — LIDOCAINE HCL (CARDIAC) PF 100 MG/5ML IV SOSY
PREFILLED_SYRINGE | INTRAVENOUS | Status: DC | PRN
  Administered 2021-09-27: 50 mg via INTRAVENOUS

## 2021-09-27 MED ORDER — SODIUM CHLORIDE 0.9 % IV SOLN: INTRAVENOUS | Status: DC

## 2021-09-27 MED ORDER — PROPOFOL 10 MG/ML IV BOLUS
INTRAVENOUS | Status: DC | PRN
  Administered 2021-09-27: 70 mg via INTRAVENOUS

## 2021-09-27 MED ORDER — LIDOCAINE HCL (PF) 2 % IJ SOLN
INTRAMUSCULAR | Status: AC
  Filled 2021-09-27: qty 5

## 2021-09-27 MED ORDER — PROPOFOL 500 MG/50ML IV EMUL
INTRAVENOUS | Status: DC | PRN
  Administered 2021-09-27: 150 ug/kg/min via INTRAVENOUS

## 2021-09-27 MED ORDER — PROPOFOL 500 MG/50ML IV EMUL
INTRAVENOUS | Status: AC
  Filled 2021-09-27: qty 50

## 2021-09-27 NOTE — Anesthesia Postprocedure Evaluation (Signed)
Anesthesia Post Note ? ?Patient: Bethany Bailey ? ?Procedure(s) Performed: COLONOSCOPY WITH PROPOFOL ? ?Patient location during evaluation: PACU ?Anesthesia Type: General ?Level of consciousness: awake and alert, oriented and patient cooperative ?Pain management: pain level controlled ?Vital Signs Assessment: post-procedure vital signs reviewed and stable ?Respiratory status: spontaneous breathing, nonlabored ventilation and respiratory function stable ?Cardiovascular status: blood pressure returned to baseline and stable ?Postop Assessment: adequate PO intake ?Anesthetic complications: no ? ? ?No notable events documented. ? ? ?Last Vitals:  ?Vitals:  ? 09/27/21 0956 09/27/21 1004  ?BP: 126/76 133/72  ?Pulse: 74   ?Resp: (!) 21   ?Temp:    ?SpO2: 100%   ?  ?Last Pain:  ?Vitals:  ? 09/27/21 1014  ?TempSrc:   ?PainSc: 0-No pain  ? ? ?  ?  ?  ?  ?  ?  ? ?Darrin Nipper ? ? ? ? ?

## 2021-09-27 NOTE — Anesthesia Preprocedure Evaluation (Addendum)
Anesthesia Evaluation  ?Patient identified by MRN, date of birth, ID band ?Patient awake ? ? ? ?Reviewed: ?Allergy & Precautions, NPO status , Patient's Chart, lab work & pertinent test results ? ?History of Anesthesia Complications ?(+) PONV and history of anesthetic complications ? ?Airway ?Mallampati: I ? ? ?Neck ROM: Full ? ? ? Dental ?no notable dental hx. ? ?  ?Pulmonary ?neg pulmonary ROS,  ?  ?Pulmonary exam normal ?breath sounds clear to auscultation ? ? ? ? ? ? Cardiovascular ?Exercise Tolerance: Good ?negative cardio ROS ?Normal cardiovascular exam ?Rhythm:Regular Rate:Normal ? ? ?  ?Neuro/Psych ?negative neurological ROS ?   ? GI/Hepatic ?GERD  ,  ?Endo/Other  ?Hypothyroidism Obesity  ? Renal/GU ?negative Renal ROS  ? ?  ?Musculoskeletal ? ? Abdominal ?  ?Peds ? Hematology ?negative hematology ROS ?(+)   ?Anesthesia Other Findings ? ? Reproductive/Obstetrics ? ?  ? ? ? ? ? ? ? ? ? ? ? ? ? ?  ?  ? ? ? ? ? ? ? ?Anesthesia Physical ?Anesthesia Plan ? ?ASA: 2 ? ?Anesthesia Plan: General  ? ?Post-op Pain Management:   ? ?Induction: Intravenous ? ?PONV Risk Score and Plan: 3 and Propofol infusion, TIVA and Treatment may vary due to age or medical condition ? ?Airway Management Planned: Natural Airway ? ?Additional Equipment:  ? ?Intra-op Plan:  ? ?Post-operative Plan:  ? ?Informed Consent: I have reviewed the patients History and Physical, chart, labs and discussed the procedure including the risks, benefits and alternatives for the proposed anesthesia with the patient or authorized representative who has indicated his/her understanding and acceptance.  ? ? ? ? ? ?Plan Discussed with: CRNA ? ?Anesthesia Plan Comments: (LMA/GETA backup discussed.  Patient consented for risks of anesthesia including but not limited to:  ?- adverse reactions to medications ?- damage to eyes, teeth, lips or other oral mucosa ?- nerve damage due to positioning  ?- sore throat or hoarseness ?-  damage to heart, brain, nerves, lungs, other parts of body or loss of life ? ?Informed patient about role of CRNA in peri- and intra-operative care.  Patient voiced understanding.)  ? ? ? ? ? ? ?Anesthesia Quick Evaluation ? ?

## 2021-09-27 NOTE — H&P (Signed)
?Cephas Darby, MD ?[address]  ?[address]  ?[place], [place] [address]  ?Main: [phone]  ?Fax: [phone] ?Pager: [phone] ? ?Primary Care Physician:  Burnard Hawthorne, FNP ?Primary Gastroenterologist:  Dr. Cephas Darby ? ?Pre-Procedure History & Physical: ?HPI:  Bethany Bailey is a 56 y.o. female is here for an colonoscopy. ?  ?Past Medical History:  ?Diagnosis Date  ? GERD (gastroesophageal reflux disease)   ? Hypothyroid   ? ? ?Past Surgical History:  ?Procedure Laterality Date  ? ABDOMINAL HYSTERECTOMY Bilateral 10/10/2016  ? Procedure: HYSTERECTOMY ABDOMINAL WITH BILATERAL SALPINGO OOPHERECTOMY;  Surgeon: Harlin Heys, MD;  Location: [facility] ORS;  Service: Gynecology;  Laterality: Bilateral;  ? BREAST BIOPSY Left 2005  ? benign  ? CERVICAL CONE BIOPSY  1990  ? [place] ABLATION  2012  ? TUBAL LIGATION  1999  ? ? ?Prior to Admission medications   ?Medication Sig Start Date End Date Taking? Authorizing Provider  ?Calcium-Magnesium-Vitamin D (CALCIUM 500 PO) Take by mouth.   Yes [provider]  ?Cholecalciferol (VITAMIN D-3) 5000 units TABS Take 5,000 Units by mouth daily with supper.   Yes [provider]  ?cyanocobalamin 1000 MCG tablet Take 1,000 mcg by mouth daily.   Yes [provider]  ?cyclobenzaprine (FLEXERIL) 5 MG tablet Take 1 tablet (5 mg total) by mouth at bedtime as needed. 08/17/20  Yes Gae Dry, MD  ?DOTTI 0.1 MG/24HR patch Place 1 patch (0.1 mg total) onto the skin 2 (two) times a week. 08/29/21  Yes Dominic, Nunzio Cobbs, CNM  ?levothyroxine (SYNTHROID) 100 MCG tablet Take 1 tablet (100 mcg total) by mouth daily before breakfast. 10/24/20  Yes Arnett, Yvetta Coder, FNP  ?Omega-3 Fatty Acids (FISH OIL PO) Take by mouth.   Yes [provider]  ?SYNTHROID 100 MCG tablet Take 1 tablet (100 mcg total) by mouth daily before breakfast. 01/03/21  Yes Renato Shin, MD  ?etodolac (LODINE) 400 MG tablet Take 400 mg by mouth 2 (two) times  daily. ?Patient not taking: Reported on 09/27/2021    [provider]  ?ibuprofen (ADVIL,MOTRIN) 200 MG tablet Take 3 tablets (600 mg total) by mouth every 6 (six) hours as needed for mild pain (for pain/headache.). ?Patient not taking: Reported on 09/27/2021 10/12/16   Rubie Maid, MD  ?progesterone (PROMETRIUM) 100 MG capsule Take 1 capsule (100 mg total) by mouth daily. 08/30/21   Dominic, Nunzio Cobbs, CNM  ? ? ?Allergies as of 08/27/2021 - Review Complete 08/26/2021  ?Allergen Reaction Noted  ? Aspirin Other (See Comments) 11/14/2013  ? Erythromycin Nausea And Vomiting 11/14/2013  ? Food  10/01/2016  ? Metronidazole Nausea Only 01/30/2017  ? Sulfa antibiotics Rash 11/14/2013  ? ? ?Family History  ?Problem Relation Age of Onset  ? Diabetes Mother   ? Hypothyroidism Mother   ? Thyroid disease Mother   ? Dementia Maternal Grandmother   ? Stroke Maternal Grandmother   ? Diabetes Maternal Grandfather   ? Alzheimer's disease Paternal Grandmother   ? Hypothyroidism Son   ? Diabetes Maternal Uncle   ? Breast cancer Neg Hx   ? ? ?Social History  ? ?Socioeconomic History  ? Marital status: Divorced  ?  Spouse name: Not on file  ? Number of children: Not on file  ? Years of education: Not on file  ? Highest education level: Not on file  ?Occupational History  ? Not on file  ?Tobacco Use  ? Smoking status: Never  ? Smokeless tobacco:  Never  ?Vaping Use  ? Vaping Use: Never used  ?Substance and Sexual Activity  ? Alcohol use: Yes  ?  Alcohol/week: 0.0 standard drinks  ?  Comment: Occasional   ? Drug use: No  ? Sexual activity: Yes  ?  Partners: Male  ?  Birth control/protection: Surgical  ?  Comment: Multiple   ?Other Topics Concern  ? Not on file  ?Social History Narrative  ? 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 Lives by herself  ? Pets: 5 dogs   ? Caffeine- Rare, no sodas  ? ?Social Determinants of Health  ? ?Financial Resource Strain: Not on file  ?Food Insecurity: Not on file  ?Transportation Needs:  Not on file  ?Physical Activity: Not on file  ?Stress: Not on file  ?Social Connections: Not on file  ?Intimate Partner Violence: Not on file  ? ? ?Review of Systems: ?See HPI, otherwise negative ROS ? ?Physical Exam: ?BP (!) 152/88   Pulse 70   Temp (!) 96.6 ?F (35.9 ?C) (Temporal)   Resp 18   Ht '5\' 5"'$  (1.651 m)   Wt 194 lb (88 kg)   LMP 09/23/2016 (Exact Date)   SpO2 100%   BMI 32.28 kg/m?  ?General:   Alert,  pleasant and cooperative in NAD ?Head:  Normocephalic and atraumatic. ?Neck:  Supple; no masses or thyromegaly. ?Lungs:  Clear throughout to auscultation.    ?Heart:  Regular rate and rhythm. ?Abdomen:  Soft, nontender and nondistended. Normal bowel sounds, without guarding, and without rebound.   ?Neurologic:  Alert and  oriented x4;  grossly normal neurologically. ? ?Impression/Plan: ?Bethany Bailey is here for an colonoscopy to be performed for colon cancer screening ? ?Risks, benefits, limitations, and alternatives regarding  colonoscopy have been reviewed with the patient.  Questions have been answered.  All parties agreeable. ? ? ?Sherri Sear, MD  09/27/2021, 9:29 AM ?

## 2021-09-27 NOTE — Transfer of Care (Signed)
Immediate Anesthesia Transfer of Care Note ? ?Patient: Bethany Bailey ? ?Procedure(s) Performed: COLONOSCOPY WITH PROPOFOL ? ?Patient Location: PACU ? ?Anesthesia Type:General ? ?Level of Consciousness: awake, alert  and oriented ? ?Airway & Oxygen Therapy: Patient Spontanous Breathing ? ?Post-op Assessment: Report given to RN and Post -op Vital signs reviewed and stable ? ?Post vital signs: Reviewed and stable ? ?Last Vitals:  ?Vitals Value Taken Time  ?BP 126/76 09/27/21 0956  ?Temp 36.2 ?C 09/27/21 0954  ?Pulse 74 09/27/21 0956  ?Resp 21 09/27/21 0956  ?SpO2 100 % 09/27/21 0956  ? ? ?Last Pain:  ?Vitals:  ? 09/27/21 0954  ?TempSrc: Temporal  ?PainSc: 0-No pain  ?   ? ?  ? ?Complications: No notable events documented. ?

## 2021-09-27 NOTE — Op Note (Signed)
[facility] ?Gastroenterology ?Patient Name: Bethany Bailey ?Procedure Date: 09/27/2021 9:25 AM ?MRN: [id number] ?Account #: [id number] ?Date of Birth: 1965/09/15 ?Admit Type: Outpatient ?Age: 56 ?Room: [facility] ENDO ROOM 2 ?Gender: Female ?Note Status: Finalized ?Instrument Name: Colonoscope [id number] ?Procedure:             Colonoscopy ?Indications:           Screening for colorectal malignant neoplasm, Last  ?                       colonoscopy: October 2001, Last colonoscopy 10 years  ?                       ago ?Providers:             Lin Landsman MD, MD ?Referring MD:          Yvetta Coder. Arnett (Referring MD) ?Medicines:             General Anesthesia ?Complications:         No immediate complications. Estimated blood loss: None. ?Procedure:             Pre-Anesthesia Assessment: ?                       - Prior to the procedure, a History and Physical was  ?                       performed, and patient medications and allergies were  ?                       reviewed. The patient is competent. The risks and  ?                       benefits of the procedure and the sedation options and  ?                       risks were discussed with the patient. All questions  ?                       were answered and informed consent was obtained.  ?                       Patient identification and proposed procedure were  ?                       verified by the physician, the nurse, the  ?                       anesthesiologist, the anesthetist and the technician  ?                       in the pre-procedure area in the procedure room in the  ?                       endoscopy suite. Mental Status Examination: alert and  ?                       oriented. Airway Examination: normal oropharyngeal  ?  airway and neck mobility. Respiratory Examination:  ?                       clear to auscultation. CV Examination: normal.  ?                       Prophylactic Antibiotics: The patient does  not require  ?                       prophylactic antibiotics. Prior Anticoagulants: The  ?                       patient has taken no previous anticoagulant or  ?                       antiplatelet agents. ASA Grade Assessment: II - A  ?                       patient with mild systemic disease. After reviewing  ?                       the risks and benefits, the patient was deemed in  ?                       satisfactory condition to undergo the procedure. The  ?                       anesthesia plan was to use general anesthesia.  ?                       Immediately prior to administration of medications,  ?                       the patient was re-assessed for adequacy to receive  ?                       sedatives. The heart rate, respiratory rate, oxygen  ?                       saturations, blood pressure, adequacy of pulmonary  ?                       ventilation, and response to care were monitored  ?                       throughout the procedure. The physical status of the  ?                       patient was re-assessed after the procedure. ?                       After obtaining informed consent, the colonoscope was  ?                       passed under direct vision. Throughout the procedure,  ?                       the patient's blood pressure, pulse, and oxygen  ?  saturations were monitored continuously. The  ?                       Colonoscope was introduced through the anus and  ?                       advanced to the the terminal ileum, with  ?                       identification of the appendiceal orifice and IC  ?                       valve. The colonoscopy was performed without  ?                       difficulty. The patient tolerated the procedure well.  ?                       The quality of the bowel preparation was evaluated  ?                       using the BBPS [facility] Bowel Preparation Scale) with  ?                       scores of: Right Colon = 3, Transverse  Colon = 3 and  ?                       Left Colon = 3 (entire mucosa seen well with no  ?                       residual staining, small fragments of stool or opaque  ?                       liquid). The total BBPS score equals 9. ?Findings: ?     The perianal and digital rectal examinations were normal. Pertinent  ?     negatives include normal sphincter tone and no palpable rectal lesions. ?     The terminal ileum appeared normal. ?     A 6 mm polyp was found in the descending colon. The polyp was sessile.  ?     The polyp was removed with a cold snare. Resection and retrieval were  ?     complete. To prevent bleeding after the polypectomy, one hemostatic clip  ?     was successfully placed (MR conditional). There was no bleeding at the  ?     end of the procedure. ?     The retroflexed view of the distal rectum and anal verge was normal and  ?     showed no anal or rectal abnormalities. ?Impression:            - The examined portion of the ileum was normal. ?                       - One 6 mm polyp in the descending colon, removed with  ?                       a cold snare. Resected and retrieved. Clip (MR  ?  conditional) was placed. ?                       - The distal rectum and anal verge are normal on  ?                       retroflexion view. ?Recommendation:        - Discharge patient to home (with escort). ?                       - Resume previous diet today. ?                       - Continue present medications. ?                       - Await pathology results. ?                       - Repeat colonoscopy in 7 years for surveillance. ?Procedure Code(s):     --- Professional --- ?                       [phone], Colonoscopy, flexible; with removal of  ?                       tumor(s), polyp(s), or other lesion(s) by snare  ?                       technique ?Diagnosis Code(s):     --- Professional --- ?                       Z12.11, Encounter for screening for malignant neoplasm  ?                        of colon ?                       K63.5, Polyp of colon ?CPT copyright 2019 American Medical Association. All rights reserved. ?The codes documented in this report are preliminary and upon coder review may  ?be revised to meet current compliance requirements. ?Dr. Ulyess Mort ?Warda Mcqueary Raeanne Gathers MD, MD ?09/27/2021 9:52:46 AM ?This report has been signed electronically. ?Number of Addenda: 0 ?Note Initiated On: 09/27/2021 9:25 AM ?Scope Withdrawal Time: 0 hours 10 minutes 4 seconds  ?Total Procedure Duration: 0 hours 13 minutes 38 seconds  ?Estimated Blood Loss:  Estimated blood loss: none. ?     [facility] ?

## 2021-09-30 ENCOUNTER — Encounter: Payer: Self-pay | Admitting: Gastroenterology

## 2021-09-30 LAB — SURGICAL PATHOLOGY

## 2021-10-01 ENCOUNTER — Encounter: Payer: Self-pay | Admitting: Gastroenterology

## 2021-10-30 ENCOUNTER — Encounter: Payer: Self-pay | Admitting: Endocrinology

## 2021-10-30 ENCOUNTER — Ambulatory Visit (INDEPENDENT_AMBULATORY_CARE_PROVIDER_SITE_OTHER): Payer: 59 | Admitting: Endocrinology

## 2021-10-30 VITALS — BP 128/84 | HR 55 | Ht 65.0 in | Wt 188.4 lb

## 2021-10-30 DIAGNOSIS — E039 Hypothyroidism, unspecified: Secondary | ICD-10-CM | POA: Diagnosis not present

## 2021-10-30 LAB — TSH: TSH: 0.65 u[IU]/mL (ref 0.35–5.50)

## 2021-10-30 LAB — T4, FREE: Free T4: 1.11 ng/dL (ref 0.60–1.60)

## 2021-10-30 NOTE — Progress Notes (Signed)
? ?Subjective:  ? ? Patient ID: Bethany Bailey, female    DOB: [birth year], 56 y.o.   MRN: [id number] ? ?HPI ? ?Pt is referred by Mable Paris, NP, for hypothyroidism.  Pt reports hypothyroidism was dx'ed in 1998.  she has been on prescribed thyroid hormone therapy since dx.  she has never taken kelp or any other type of non-prescribed thyroid product.  she has never had thyroid surgery, or XRT to the neck.  she has never been on amiodarone or lithium.   She has been on 100 mcg/d, x approx 5 years. [place] 2022 Mildly heterogeneous thyroid gland.  No discrete thyroid nodules.   She requests brand name synthroid.   ?Past Medical History:  ?Diagnosis Date  ? GERD (gastroesophageal reflux disease)   ? Hypothyroid   ? ? ?Past Surgical History:  ?Procedure Laterality Date  ? ABDOMINAL HYSTERECTOMY Bilateral 10/10/2016  ? Procedure: HYSTERECTOMY ABDOMINAL WITH BILATERAL SALPINGO OOPHERECTOMY;  Surgeon: Harlin Heys, MD;  Location: [facility] ORS;  Service: Gynecology;  Laterality: Bilateral;  ? BREAST BIOPSY Left 2005  ? benign  ? CERVICAL CONE BIOPSY  1990  ? COLONOSCOPY WITH PROPOFOL N/A 09/27/2021  ? Procedure: COLONOSCOPY WITH PROPOFOL;  Surgeon: Lin Landsman, MD;  [facility] ENDOSCOPY;  Service: Gastroenterology;  Laterality: N/A;  ? [place] ABLATION  2012  ? TUBAL LIGATION  1999  ? ? ?Social History  ? ?Socioeconomic History  ? Marital status: Divorced  ?  Spouse name: Not on file  ? Number of children: Not on file  ? Years of education: Not on file  ? Highest education level: Not on file  ?Occupational History  ? Not on file  ?Tobacco Use  ? Smoking status: Never  ? Smokeless tobacco: Never  ?Vaping Use  ? Vaping Use: Never used  ?Substance and Sexual Activity  ? Alcohol use: Yes  ?  Alcohol/week: 0.0 standard drinks  ?  Comment: Occasional   ? Drug use: No  ? Sexual activity: Yes  ?  Partners: Male  ?  Birth control/protection: Surgical  ?  Comment: Multiple   ?Other Topics Concern  ? Not on file  ?Social  History Narrative  ? 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 Lives by herself  ? Pets: 5 dogs   ? Caffeine- Rare, no sodas  ? ?Social Determinants of Health  ? ?Financial Resource Strain: Not on file  ?Food Insecurity: Not on file  ?Transportation Needs: Not on file  ?Physical Activity: Not on file  ?Stress: Not on file  ?Social Connections: Not on file  ?Intimate Partner Violence: Not on file  ? ? ?Current Outpatient Medications on File Prior to Visit  ?Medication Sig Dispense Refill  ? Calcium-Magnesium-Vitamin D (CALCIUM 500 PO) Take by mouth.    ? Cholecalciferol (VITAMIN D-3) 5000 units TABS Take 5,000 Units by mouth daily with supper.    ? cyanocobalamin 1000 MCG tablet Take 1,000 mcg by mouth daily.    ? cyclobenzaprine (FLEXERIL) 5 MG tablet Take 1 tablet (5 mg total) by mouth at bedtime as needed. 30 tablet 2  ? DOTTI 0.1 MG/24HR patch Place 1 patch (0.1 mg total) onto the skin 2 (two) times a week. 8 patch 12  ? etodolac (LODINE) 400 MG tablet Take 400 mg by mouth 2 (two) times daily.    ? ibuprofen (ADVIL,MOTRIN) 200 MG tablet Take 3 tablets (600 mg total) by mouth every 6 (six) hours as needed for mild  pain (for pain/headache.). 30 tablet 0  ? Omega-3 Fatty Acids (FISH OIL PO) Take by mouth.    ? progesterone (PROMETRIUM) 100 MG capsule Take 1 capsule (100 mg total) by mouth daily. 30 capsule 12  ? SYNTHROID 100 MCG tablet Take 1 tablet (100 mcg total) by mouth daily before breakfast. 90 tablet 3  ? ?No current facility-administered medications on file prior to visit.  ? ? ?Allergies  ?Allergen Reactions  ? Aspirin Other (See Comments)  ?  Ears ring  ? Erythromycin Nausea And Vomiting  ? Food   ?  Wheat-coughing/sneezing & joint pain.  ? Metronidazole Nausea Only  ?  Paresthesias, dizziness  ? Sulfa Antibiotics Rash  ? ? ?Family History  ?Problem Relation Age of Onset  ? Diabetes Mother   ? Hypothyroidism Mother   ? Thyroid disease Mother   ? Dementia Maternal Grandmother   ? Stroke Maternal  Grandmother   ? Diabetes Maternal Grandfather   ? Alzheimer's disease Paternal Grandmother   ? Hypothyroidism Son   ? Diabetes Maternal Uncle   ? Breast cancer Neg Hx   ? ? ?BP 128/84 (BP Location: Left Arm, Patient Position: Sitting, Cuff Size: Normal)   Pulse (!) 55   Ht '5\' 5"'$  (1.651 m)   Wt 188 lb 6.4 oz (85.5 kg)   LMP 09/23/2016 (Exact Date)   SpO2 100%   BMI 31.35 kg/m?  ? ? ? ?Review of Systems ? ?   ?Objective:  ? Physical Exam ?VITAL SIGNS:  See vs page ?GENERAL: no distress ?NECK: There is no palpable thyroid enlargement.  No thyroid nodule is palpable.  No palpable lymphadenopathy at the anterior neck.   ? ? ? ?Lab Results  ?Component Value Date  ? TSH 0.65 10/30/2021  ? T3TOTAL 67 (L) 05/26/2018  ? ?   ?Assessment & Plan:  ?Hypothyroidism: well-controlled.  Please continue the same Synthroid ? ?

## 2021-10-30 NOTE — Patient Instructions (Signed)
Blood tests are requested for you today.  We'll let you know about the results.   ?You should have an endocrinology follow-up appointment in 1 year.  ? ?

## 2021-11-06 ENCOUNTER — Ambulatory Visit
Admission: RE | Admit: 2021-11-06 | Discharge: 2021-11-06 | Disposition: A | Discharge status: Home / Self Care | Payer: 59 | Source: Ambulatory Visit | Attending: Licensed Practical Nurse | Admitting: Licensed Practical Nurse

## 2021-11-06 DIAGNOSIS — Z1231 Encounter for screening mammogram for malignant neoplasm of breast: Secondary | ICD-10-CM | POA: Insufficient documentation

## 2021-11-25 ENCOUNTER — Other Ambulatory Visit: Payer: Self-pay

## 2021-12-30 ENCOUNTER — Ambulatory Visit: Payer: 59 | Admitting: Endocrinology

## 2022-02-18 ENCOUNTER — Telehealth: Payer: Self-pay | Admitting: Internal Medicine

## 2022-02-18 MED ORDER — SYNTHROID 100 MCG PO TABS: 100.0000 ug | ORAL_TABLET | Freq: Every day | ORAL | 1 refills | Status: DC

## 2022-02-18 NOTE — Telephone Encounter (Signed)
MEDICATION: Synthroid, 100 mcg  PHARMACY:  [facility], [place], [place]  HAS THE PATIENT CONTACTED THEIR PHARMACY?  yes  IS THIS A 90 DAY SUPPLY : unknown  IS PATIENT OUT OF MEDICATION: yes  IF NOT; HOW MUCH IS LEFT:   LAST APPOINTMENT DATE: '@4'$ /26/2023  NEXT APPOINTMENT DATE:'@Visit'$  date not found  DO WE HAVE YOUR PERMISSION TO LEAVE A DETAILED MESSAGE?:  OTHER COMMENTS:    **Let patient know to contact pharmacy at the end of the day to make sure medication is ready. **  ** Please notify patient to allow 48-72 hours to process**  **Encourage patient to contact the pharmacy for refills or they can request refills through [facility]**

## 2022-02-18 NOTE — Telephone Encounter (Signed)
Done

## 2022-04-11 ENCOUNTER — Ambulatory Visit: Payer: 59 | Admitting: Family

## 2022-04-11 ENCOUNTER — Encounter: Payer: Self-pay | Admitting: Family

## 2022-04-11 VITALS — BP 140/82 | HR 79 | Temp 98.0°F | Ht 65.0 in | Wt 189.0 lb

## 2022-04-11 DIAGNOSIS — M25572 Pain in left ankle and joints of left foot: Secondary | ICD-10-CM

## 2022-04-11 DIAGNOSIS — J40 Bronchitis, not specified as acute or chronic: Secondary | ICD-10-CM | POA: Diagnosis not present

## 2022-04-11 MED ORDER — ALBUTEROL SULFATE HFA 108 (90 BASE) MCG/ACT IN AERS: 2.0000 | INHALATION_SPRAY | Freq: Four times a day (QID) | RESPIRATORY_TRACT | 0 refills | Status: DC | PRN

## 2022-04-11 MED ORDER — AMOXICILLIN-POT CLAVULANATE 875-125 MG PO TABS: 1.0000 | ORAL_TABLET | Freq: Two times a day (BID) | ORAL | 0 refills | Status: AC

## 2022-04-11 NOTE — Assessment & Plan Note (Signed)
Afebrile.  No acute respiratory distress or adventitious lung sounds on exam today.  Based on duration of symptoms advised patient more than likely bacterial URI at this time.  Advised to start Augmentin.  She will use albuterol inhaler every 6 hours due to wheezing.  Discussed prednisone if symptoms linger after antibiotic.  She will let me know how she is doing.

## 2022-04-11 NOTE — Patient Instructions (Addendum)
Please wear the Ace wrap, ice your ankle twice a day for 20 minutes.    Start Augmentin.  Ensure to take probiotics while on antibiotics and also for 2 weeks after completion. This can either be by eating yogurt daily or taking a probiotic supplement over the counter such as Culturelle.It is important to re-colonize the gut with good bacteria and also to prevent any diarrheal infections associated with antibiotic use.   Use albuterol every 6 hours for first 24 hours to get good medication into the lungs and loosen congestion; after, you may use as needed and eventually stop all together when cough resolves.  Please let me know how you are doing

## 2022-04-11 NOTE — Progress Notes (Signed)
Subjective:    Patient ID: Bethany Bailey, female    DOB: 10-10-1965, 56 y.o.   MRN: [id number]  CC: Bethany Bailey is a 56 y.o. female who presents today for an acute visit.    HPI: Complains of nasal congestion, cough x 11 days, unchanged.  Covid test at home negative Ears are stopped up.  She has now started to cough and wheeze as of last night.   She has been taking elderberry and Zicam. Tried robitussin last night.       She 'turned her left ankle this morning', walking and inversiokn injury with hole.  Heard pop. No swellimg. Able to bear weeight nor pain when walking.  Bottom left side of foot is numb.  No laceration.   She has been on prednisone in the past and done well.  Never smoker  HISTORY:  Past Medical History:  Diagnosis Date   GERD (gastroesophageal reflux disease)    Hypothyroid    Past Surgical History:  Procedure Laterality Date   ABDOMINAL HYSTERECTOMY Bilateral 10/10/2016   Procedure: HYSTERECTOMY ABDOMINAL WITH BILATERAL SALPINGO OOPHERECTOMY;  Surgeon: Harlin Heys, MD;  Location: [facility] ORS;  Service: Gynecology;  Laterality: Bilateral;   BREAST BIOPSY Left 2005   benign   CERVICAL CONE BIOPSY  1990   COLONOSCOPY WITH PROPOFOL N/A 09/27/2021   Procedure: COLONOSCOPY WITH PROPOFOL;  Surgeon: Lin Landsman, MD;  [facility] END[facility];  Service: Gastroenterology;  Laterality: N/A;   NOVASURE ABLATION  2012   TUBAL LIGATION  1999   Family History  Problem Relation Age of Onset   Diabetes Mother    Hypothyroidism Mother    Thyroid disease Mother    Dementia Maternal Grandmother    Stroke Maternal Grandmother    Diabetes Maternal Grandfather    Alzheimer's disease Paternal Grandmother    Hypothyroidism Son    Diabetes Maternal Uncle    Breast cancer Neg Hx     Allergies: Aspirin, Erythromycin, Food, Ibuprofen, Metronidazole, and Sulfa antibiotics Current Outpatient Medications on File Prior to Visit  Medication Sig Dispense  Refill   Calcium-Magnesium-Vitamin D (CALCIUM 500 PO) Take by mouth.     Cholecalciferol (VITAMIN D-3) 5000 units TABS Take 5,000 Units by mouth daily with supper.     cyanocobalamin 1000 MCG tablet Take 1,000 mcg by mouth daily.     cyclobenzaprine (FLEXERIL) 5 MG tablet Take 1 tablet (5 mg total) by mouth at bedtime as needed. 30 tablet 2   DOTTI 0.1 MG/24HR patch Place 1 patch (0.1 mg total) onto the skin 2 (two) times a week. 8 patch 12   etodolac (LODINE) 400 MG tablet Take 400 mg by mouth 2 (two) times daily.     Omega-3 Fatty Acids (FISH OIL PO) Take by mouth.     progesterone (PROMETRIUM) 100 MG capsule Take 1 capsule (100 mg total) by mouth daily. 30 capsule 12   SYNTHROID 100 MCG tablet Take 1 tablet (100 mcg total) by mouth daily before breakfast. 90 tablet 1   No current facility-administered medications on file prior to visit.    Social History   Tobacco Use   Smoking status: Never   Smokeless tobacco: Never  Vaping Use   Vaping Use: Never used  Substance Use Topics   Alcohol use: Yes    Alcohol/week: 0.0 standard drinks of alcohol    Comment: Occasional    Drug use: No    Review of Systems  Constitutional:  Negative for chills and fever.  Respiratory:  Negative for cough.   Cardiovascular:  Negative for chest pain and palpitations.  Gastrointestinal:  Negative for nausea and vomiting.      Objective:    BP (!) 140/82 (BP Location: Left Arm, Patient Position: Sitting, Cuff Size: Normal)   Pulse 79   Temp 98 F (36.7 C) (Oral)   Ht '5\' 5"'$  (1.651 m)   Wt 189 lb (85.7 kg)   LMP 09/23/2016 (Exact Date)   SpO2 (!) 79%   BMI 31.45 kg/m    Physical Exam Vitals reviewed.  Constitutional:      Appearance: She is well-developed.  Eyes:     Conjunctiva/sclera: Conjunctivae normal.  Cardiovascular:     Rate and Rhythm: Normal rate and regular rhythm.     Pulses: Normal pulses.     Heart sounds: Normal heart sounds.  Pulmonary:     Effort: Pulmonary effort  is normal.     Breath sounds: Normal breath sounds. No wheezing, rhonchi or rales.  Musculoskeletal:     Right ankle: No swelling, ecchymosis or lacerations. No tenderness. Normal range of motion. Normal pulse.     Left ankle: No swelling or ecchymosis. No tenderness. Normal range of motion. Normal pulse.     Comments: No pain with Squeeze test at mid calf. No pain over lateral malleolus,  base of the fifth metatarsal or navicular bone.Slight tenderness over medial malleolus.  No pain elicited with external rotation stress test. Able to plantar and dorsi flex without pain.  Sensation intact equally bilateral lower extremities. Palpable pedal pulses.   Skin:    General: Skin is warm and dry.  Neurological:     Mental Status: She is alert.  Psychiatric:        Speech: Speech normal.        Behavior: Behavior normal.        Thought Content: Thought content normal.        Assessment & Plan:   Problem List Items Addressed This Visit       Respiratory   Bronchitis - Primary    Afebrile.  No acute respiratory distress or adventitious lung sounds on exam today.  Based on duration of symptoms advised patient more than likely bacterial URI at this time.  Advised to start Augmentin.  She will use albuterol inhaler every 6 hours due to wheezing.  Discussed prednisone if symptoms linger after antibiotic.  She will let me know how she is doing.      Relevant Medications   amoxicillin-clavulanate (AUGMENTIN) 875-125 MG tablet   albuterol (VENTOLIN HFA) 108 (90 Base) MCG/ACT inhaler     Other   Left ankle pain    Presentation consistent with left ankle sprain versus fracture that occurred earlier today.  Advised patient to use Ace wrap, icing and Tylenol.  Patient politely declines x-ray at this time which I think is reasonable.  She will let me know how she is doing         I have discontinued [name]'s ibuprofen. I am also having her start on amoxicillin-clavulanate and  albuterol. Additionally, I am having her maintain her Vitamin D-3, etodolac, cyanocobalamin, Calcium-Magnesium-Vitamin D (CALCIUM 500 PO), Omega-3 Fatty Acids (FISH OIL PO), cyclobenzaprine, Dotti, progesterone, and Synthroid.   Meds ordered this encounter  Medications   amoxicillin-clavulanate (AUGMENTIN) 875-125 MG tablet    Sig: Take 1 tablet by mouth 2 (two) times daily for 7 days.    Dispense:  14 tablet    Refill:  0  Order Specific Question:   Supervising Provider    Answer:   Crecencio Mc [[id number]]   albuterol (VENTOLIN HFA) 108 (90 Base) MCG/ACT inhaler    Sig: Inhale 2 puffs into the lungs every 6 (six) hours as needed for wheezing or shortness of breath.    Dispense:  8 g    Refill:  0    Order Specific Question:   Supervising Provider    Answer:   Crecencio Mc [[id number]]    Return precautions given.   Risks, benefits, and alternatives of the medications and treatment plan prescribed today were discussed, and patient expressed understanding.   Education regarding symptom management and diagnosis given to patient on AVS.  Continue to follow with Burnard Hawthorne, FNP for routine health maintenance.   Merilyn Baba and I agreed with plan.   Mable Paris, FNP

## 2022-04-11 NOTE — Assessment & Plan Note (Signed)
Presentation consistent with left ankle sprain versus fracture that occurred earlier today.  Advised patient to use Ace wrap, icing and Tylenol.  Patient politely declines x-ray at this time which I think is reasonable.  She will let me know how she is doing

## 2022-04-23 ENCOUNTER — Encounter: Payer: Self-pay | Admitting: Family

## 2022-04-23 DIAGNOSIS — J40 Bronchitis, not specified as acute or chronic: Secondary | ICD-10-CM

## 2022-04-25 ENCOUNTER — Other Ambulatory Visit: Payer: Self-pay | Admitting: Family

## 2022-04-25 DIAGNOSIS — J40 Bronchitis, not specified as acute or chronic: Secondary | ICD-10-CM

## 2022-04-25 MED ORDER — PREDNISONE 10 MG PO TABS: ORAL_TABLET | ORAL | 0 refills | Status: DC

## 2022-04-25 MED ORDER — ALBUTEROL SULFATE HFA 108 (90 BASE) MCG/ACT IN AERS: 2.0000 | INHALATION_SPRAY | Freq: Four times a day (QID) | RESPIRATORY_TRACT | 0 refills | Status: DC | PRN

## 2022-04-25 NOTE — Telephone Encounter (Signed)
Spoke with pt She is feeling better  She has episodic cough, thin, clear congestion.   SOB and wheezing has resolved. No chest pain , chest tightness, fever.   She has using mucinex with some relief.  She has been on prednisone in the past and tolerated well  She is [place] way to the beach today  Plan:   She is heading to beach and will get CXR when she is back in 3 days.   Start albuterol Start prednisone tomorrow morning.   Counseled if she would develop shortness of breath to go [place] to be evaluated in person; patient verbalized understanding

## 2022-04-28 ENCOUNTER — Telehealth: Payer: Self-pay

## 2022-04-28 ENCOUNTER — Ambulatory Visit
Admission: RE | Admit: 2022-04-28 | Discharge: 2022-04-28 | Disposition: A | Discharge status: Home / Self Care | Payer: 59 | Attending: Family | Admitting: Family

## 2022-04-28 ENCOUNTER — Ambulatory Visit
Admission: RE | Admit: 2022-04-28 | Discharge: 2022-04-28 | Disposition: A | Discharge status: Home / Self Care | Payer: 59 | Source: Ambulatory Visit | Attending: Family | Admitting: Family

## 2022-04-28 DIAGNOSIS — J40 Bronchitis, not specified as acute or chronic: Secondary | ICD-10-CM

## 2022-04-28 DIAGNOSIS — R059 Cough, unspecified: Secondary | ICD-10-CM | POA: Diagnosis not present

## 2022-04-28 NOTE — Telephone Encounter (Signed)
Patient states she would like to speak with Mable Paris, FNP, regarding the results of her chest x-ray.

## 2022-05-02 MED ORDER — BUDESONIDE-FORMOTEROL FUMARATE 80-4.5 MCG/ACT IN AERO: 2.0000 | INHALATION_SPRAY | Freq: Two times a day (BID) | RESPIRATORY_TRACT | 1 refills | Status: DC

## 2022-05-02 NOTE — Telephone Encounter (Signed)
Call pt  Is cough or congestion not better after augmentin?  How how often is she using albuterol?   Please confirm no sob, cp, wheezing, fever. If yes, she needs to go to [facility] care today. I recommend UC at [facility]   If she would like to trial different inhaler with prednisone , Symbicort, I can send in. Let me know  Please sch her for follow up with me next week once sure she doenst have alarm features per above.

## 2022-05-02 NOTE — Telephone Encounter (Signed)
Noted close

## 2022-05-09 ENCOUNTER — Ambulatory Visit: Payer: 59 | Admitting: Family

## 2022-07-22 ENCOUNTER — Encounter: Payer: Self-pay | Admitting: Family

## 2022-07-22 NOTE — Telephone Encounter (Signed)
LVM to call back to schedule in office  appt

## 2022-07-24 NOTE — Telephone Encounter (Signed)
LVM to call back to schedule ov and also sent message via mychart

## 2022-07-24 NOTE — Telephone Encounter (Signed)
No problem. Thanks 

## 2022-08-01 ENCOUNTER — Ambulatory Visit: Payer: 59 | Admitting: Family

## 2022-08-01 ENCOUNTER — Ambulatory Visit (INDEPENDENT_AMBULATORY_CARE_PROVIDER_SITE_OTHER): Payer: 59

## 2022-08-01 ENCOUNTER — Encounter: Payer: Self-pay | Admitting: Family

## 2022-08-01 VITALS — BP 136/78 | HR 84 | Temp 98.2°F | Ht 65.0 in | Wt 186.0 lb

## 2022-08-01 DIAGNOSIS — E039 Hypothyroidism, unspecified: Secondary | ICD-10-CM | POA: Diagnosis not present

## 2022-08-01 DIAGNOSIS — L89329 Pressure ulcer of left buttock, unspecified stage: Secondary | ICD-10-CM

## 2022-08-01 DIAGNOSIS — M160 Bilateral primary osteoarthritis of hip: Secondary | ICD-10-CM | POA: Diagnosis not present

## 2022-08-01 DIAGNOSIS — R21 Rash and other nonspecific skin eruption: Secondary | ICD-10-CM | POA: Diagnosis not present

## 2022-08-01 DIAGNOSIS — M533 Sacrococcygeal disorders, not elsewhere classified: Secondary | ICD-10-CM | POA: Diagnosis not present

## 2022-08-01 DIAGNOSIS — M545 Low back pain, unspecified: Secondary | ICD-10-CM | POA: Diagnosis not present

## 2022-08-01 LAB — TSH: TSH: 1.15 u[IU]/mL (ref 0.35–5.50)

## 2022-08-01 MED ORDER — CLINDAMYCIN PHOSPHATE 1 % EX GEL: Freq: Two times a day (BID) | CUTANEOUS | 2 refills | Status: DC

## 2022-08-01 MED ORDER — SYNTHROID 100 MCG PO TABS: 100.0000 ug | ORAL_TABLET | Freq: Every day | ORAL | 3 refills | Status: DC

## 2022-08-01 NOTE — Assessment & Plan Note (Signed)
Previously euthyroid.  Refilled Synthroid until patient can be seen and establish care with new endocrinologist. Referral placed.

## 2022-08-01 NOTE — Assessment & Plan Note (Addendum)
Focal pain over left ischial tuberosity.  No sacroiliac joint pain, pain over left trochanter.  Patient intolerant to NSAIDs.  Advised Tylenol arthritis.  Pending x-rays, physical therapy referral.  Patient will let me know she is doing

## 2022-08-01 NOTE — Progress Notes (Signed)
Assessment & Plan:  Hypothyroidism, unspecified type Assessment & Plan: Previously euthyroid.  Refilled Synthroid until patient can be seen and establish care with new endocrinologist. Referral placed.   Orders: -     Ambulatory referral to Endocrinology -     TSH -     Synthroid; Take 1 tablet (100 mcg total) by mouth daily before breakfast.  Dispense: 90 tablet; Refill: 3  Pressure injury of skin of left ischial tuberosity region Assessment & Plan: Focal pain over left ischial tuberosity.  No sacroiliac joint pain, pain over left trochanter.  Patient intolerant to NSAIDs.  Advised Tylenol arthritis.  Pending x-rays, physical therapy referral.  Patient will let me know she is doing  Orders: -     DG HIPS BILAT W OR W/O PELVIS 3-4 VIEWS; Future -     DG Lumbar Spine Complete; Future -     DG Sacrum/Coccyx; Future -     Ambulatory referral to Physical Therapy  Rash Assessment & Plan: Acne versus irritant dermatitis from mass.  Start Clindagel.  Intolerant to Benzyl peroxide .  Consider topical steroid.  Orders: -     Clindamycin Phosphate; Apply topically 2 (two) times daily.  Dispense: 30 g; Refill: 2     Return precautions given.   Risks, benefits, and alternatives of the medications and treatment plan prescribed today were discussed, and patient expressed understanding.   Education regarding symptom management and diagnosis given to patient on AVS either electronically or printed.  Return in about 6 months (around 01/30/2023).  Mable Paris, FNP  Subjective:    Patient ID: Bethany Bailey, female    DOB: 1965/10/18, 57 y.o.   MRN: [id number]  CC: Bethany Bailey is a 57 y.o. female who presents today for follow up.   HPI: She complains of pain left buttocks right were she is sitting x 7 months, waxes and wanes.  No numbness, saddle anesthesia, trouble urinating or having BM.  Stretching feels good during stretches.  She can sleep on left hip.  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     She has used blue emu, tylenol '325mg'$  with relief.  No low back pain, constipation, fever.    She complains of facial rash after wearing a mask.  She had no facial rash prior to mask wearing.  Her dermatologist gave her benzyl peroxide which caused her face to swell.  Previously on amoxicillin and rash had improved.  No lesions at this time  History of hypothyroidism.  No history of thyroid surgery,  XRT of the neck.  previously seen Dr. Loanne Drilling. Allergies: Aspirin, Erythromycin, Food, Ibuprofen, Metronidazole, and Sulfa antibiotics Current Outpatient Medications on File Prior to Visit  Medication Sig Dispense Refill   albuterol (VENTOLIN HFA) 108 (90 Base) MCG/ACT inhaler Inhale 2 puffs into the lungs every 6 (six) hours as needed for wheezing or shortness of breath. 8 g 0   Calcium-Magnesium-Vitamin D (CALCIUM 500 PO) Take by mouth.     Cholecalciferol (VITAMIN D-3) 5000 units TABS Take 5,000 Units by mouth daily with supper.     cyanocobalamin 1000 MCG tablet Take 1,000 mcg by mouth daily.     cyclobenzaprine (FLEXERIL) 5 MG tablet Take 1 tablet (5 mg total) by mouth at bedtime as needed. 30 tablet 2   DOTTI 0.1 MG/24HR patch Place 1 patch (0.1 mg total) onto the skin 2 (two) times a week. 8 patch 12  etodolac (LODINE) 400 MG tablet Take 400 mg by mouth 2 (two) times daily.     Omega-3 Fatty Acids (FISH OIL PO) Take by mouth.     progesterone (PROMETRIUM) 100 MG capsule Take 1 capsule (100 mg total) by mouth daily. 30 capsule 12   No current facility-administered medications on file prior to visit.    Review of Systems  Constitutional:  Negative for chills and fever.  Respiratory:  Negative for cough.   Cardiovascular:  Negative for chest pain and palpitations.  Gastrointestinal:  Negative for nausea and vomiting.  Musculoskeletal:  Positive for arthralgias. Negative for back pain.  Skin:   Positive for rash.  Neurological:  Negative for numbness.      Objective:    BP 136/78   Pulse 84   Temp 98.2 F (36.8 C) (Oral)   Ht '5\' 5"'$  (1.651 m)   Wt 186 lb (84.4 kg)   LMP 09/23/2016 (Exact Date)   SpO2 98%   BMI 30.95 kg/m  BP Readings from Last 3 Encounters:  08/01/22 136/78  04/11/22 (!) 140/82  10/30/21 128/84   Wt Readings from Last 3 Encounters:  08/01/22 186 lb (84.4 kg)  04/11/22 189 lb (85.7 kg)  10/30/21 188 lb 6.4 oz (85.5 kg)    Physical Exam Vitals reviewed.  Constitutional:      Appearance: She is well-developed.  Eyes:     Conjunctiva/sclera: Conjunctivae normal.  Cardiovascular:     Rate and Rhythm: Normal rate and regular rhythm.     Pulses: Normal pulses.     Heart sounds: Normal heart sounds.  Pulmonary:     Effort: Pulmonary effort is normal.     Breath sounds: Normal breath sounds. No wheezing, rhonchi or rales.  Musculoskeletal:     Lumbar back: No swelling, edema, spasms, tenderness or bony tenderness. Normal range of motion.     Comments: Full range of motion with flexion, tension, lateral side bends. No bony tenderness. No pain, numbness, tingling elicited with single leg raise bilaterally.  No abscess, erythema or mass appreciated with palpation of left buttocks.  Pain reproduced with deep palpation of left ischial tuberosity  Skin:    General: Skin is warm and dry.     Findings: No rash.  Neurological:     Mental Status: She is alert.     Sensory: No sensory deficit.     Deep Tendon Reflexes:     Reflex Scores:      Patellar reflexes are 2+ on the right side and 2+ on the left side.    Comments: Sensation and strength intact bilateral lower extremities.  Psychiatric:        Speech: Speech normal.        Behavior: Behavior normal.        Thought Content: Thought content normal.

## 2022-08-01 NOTE — Assessment & Plan Note (Signed)
Acne versus irritant dermatitis from mass.  Start Clindagel.  Intolerant to Benzyl peroxide .  Consider topical steroid.

## 2022-08-01 NOTE — Patient Instructions (Addendum)
Trial of clindagel for facial rash   You may call [place] [facility], dr Kelton Pillar to establish care.   [phone]  I have also placed a referral as of course we want you to stay in [place].    Referral to physical therapy. Let us know if you dont hear back within a week in regards to an appointment being scheduled.   So that you are aware, if you are [facility] MyChart user , please pay attention to your MyChart messages as you may receive a MyChart message with a phone number to call and schedule this test/appointment own your own from our referral coordinator. This is a new process so I do not want you to miss this message.  If you are not a MyChart user, you will receive a phone call.   As discussed, let's start by scheduling Tylenol Arthritis which is a '650mg'$  tablet .   You may take 1-2 tablets every 8 hours ( scheduled) with maximum of 6 tablets per day. Most adults can safely take 4 pills total per day of Tylenol Arthritis '650mg'$  tablet. Do not exceed 6 tablets a day of Tylenol Arthritis '650mg'$  tablet   For example , you could take two tablets in the morning ( 8am) and then two tablets again at 4pm.   Maximum daily dose of acetaminophen 4 g per day from all sources.  If you are taking another medication which includes acetaminophen (Tylenol) which may be in cough and cold preparations or pain medication such as Percocet, you will need to factor that into your total daily dose to be safe.  Please let me know if any questions  A great article regarding how to safely take and dose tylenol found below.  Title : 'Acetaminophen safety: Be cautious but not afraid'  https://[email or web address].http://www.walter.org/

## 2022-08-04 ENCOUNTER — Encounter: Payer: Self-pay | Admitting: Family

## 2022-08-14 ENCOUNTER — Telehealth: Payer: Self-pay

## 2022-08-14 NOTE — Telephone Encounter (Signed)
LVM to call back to office to go over results

## 2022-08-15 ENCOUNTER — Telehealth: Payer: Self-pay

## 2022-08-15 NOTE — Telephone Encounter (Signed)
Lvm to inform pt that her results shows Degenerative changes seen of the sacroiliac joint per Joycelyn Schmid

## 2022-08-15 NOTE — Telephone Encounter (Signed)
Patient returned call from Bethany Bailey, [place], regarding her results.  I was unable to reach Onycha at the time of the call.  I let patient know I will send Bethany a message so she can call her back.

## 2022-08-19 ENCOUNTER — Telehealth: Payer: Self-pay

## 2022-08-19 NOTE — Telephone Encounter (Signed)
Pt called and I tried to read the message to her but there were some words I could not pronounce

## 2022-08-19 NOTE — Telephone Encounter (Signed)
Wanted to inform pt that there were Degenerative changes seen of the sacroiliac joint.     Regards, Joycelyn Schmid

## 2022-08-20 NOTE — Telephone Encounter (Signed)
Spoke to pt and went over results wit her. Pt verbalized understanding

## 2022-08-20 NOTE — Telephone Encounter (Signed)
See previous note

## 2022-08-20 NOTE — Telephone Encounter (Signed)
Pt called asking for Jenate. Call was transferred.

## 2022-08-29 ENCOUNTER — Ambulatory Visit (INDEPENDENT_AMBULATORY_CARE_PROVIDER_SITE_OTHER): Payer: 59 | Admitting: Licensed Practical Nurse

## 2022-08-29 ENCOUNTER — Other Ambulatory Visit (HOSPITAL_COMMUNITY)
Admission: RE | Admit: 2022-08-29 | Discharge: 2022-08-29 | Disposition: A | Discharge status: Home / Self Care | Payer: 59 | Source: Ambulatory Visit | Attending: Licensed Practical Nurse | Admitting: Licensed Practical Nurse

## 2022-08-29 VITALS — BP 124/64 | HR 82 | Ht 65.5 in | Wt 186.9 lb

## 2022-08-29 DIAGNOSIS — N898 Other specified noninflammatory disorders of vagina: Secondary | ICD-10-CM

## 2022-08-29 DIAGNOSIS — R232 Flushing: Secondary | ICD-10-CM

## 2022-08-29 DIAGNOSIS — Z01411 Encounter for gynecological examination (general) (routine) with abnormal findings: Secondary | ICD-10-CM

## 2022-08-29 DIAGNOSIS — Z01419 Encounter for gynecological examination (general) (routine) without abnormal findings: Secondary | ICD-10-CM | POA: Diagnosis not present

## 2022-08-29 DIAGNOSIS — N951 Menopausal and female climacteric states: Secondary | ICD-10-CM

## 2022-08-29 MED ORDER — PROGESTERONE MICRONIZED 100 MG PO CAPS: 100.0000 mg | ORAL_CAPSULE | Freq: Every day | ORAL | 12 refills | Status: DC

## 2022-08-29 MED ORDER — DOTTI 0.1 MG/24HR TD PTTW: 1.0000 | MEDICATED_PATCH | TRANSDERMAL | 12 refills | Status: DC

## 2022-08-29 NOTE — Progress Notes (Signed)
Gynecology Annual Exam  PCP: Burnard Hawthorne, FNP  Chief Complaint:  Chief Complaint  Patient presents with   Gynecologic Exam    History of Present Illness:Patient is a 57 y.o. G0P0000 presents for annual exam. The patient has noticed vaginal itching/burning since Tuesday, denies any odor or changed to her discharge. Has not not made any recent changes to her  products. Desires refill on her Progesterone (this helps her sleep) and estrogen patch.   LMP: Patient's last menstrual period was 09/23/2016 (exact date). Complete hysterectomy   The patient is sexually active with 1 female partner. She denies dyspareunia.  The patient does perform self breast exams.  There is no notable family history of breast or ovarian cancer in her family.  The patient wears seatbelts: yes.   The patient has regular exercise:  rare .  Has made dietary changes and lost a few pounds   Works as a Geophysicist/field seismologist Lives alone, her dog passed away in 2023/06/26 Has a partner, feels safe  Wears glasses: last eye exam a year ago Dental exam has been years Derm once a year PCP Mable Paris, last seen last month   The patient denies current symptoms of depression.   Has a hx of depression, reports she is fine now   Review of Systems: ROS see HPI reports she has not had any hot flashes   Past Medical History:  Patient Active Problem List   Diagnosis Date Noted   Pressure injury of skin of left ischial tuberosity region 08/01/2022   Rash 08/01/2022   Bronchitis 04/11/2022   Left ankle pain 04/11/2022   Colon cancer screening    Polyp of descending colon    Ear fullness, right 10/15/2020   Urinary urgency 04/11/2019   Obesity (BMI 30.0-34.9) 03/02/2018   Spondylolisthesis at L5-S1 level 03/02/2018   Depression 02/03/2018   Cognitive change 09/14/2017   Bacterial vaginosis 01/26/2017   Hot flashes 01/14/2017   Menopause 01/14/2017   Postconcussive syndrome 01/02/2017   Impingement  syndrome, shoulder, right 12/05/2016   Post-operative state 10/10/2016   Hypothyroidism 02/07/2016   Well woman exam with routine gynecological exam 02/07/2016   High risk heterosexual behavior 01/23/2015   Gastritis determined by endoscopy 11/15/2013   Heartburn 11/15/2013   Hiatal hernia 11/15/2013    Past Surgical History:  Past Surgical History:  Procedure Laterality Date   ABDOMINAL HYSTERECTOMY Bilateral 10/10/2016   Procedure: HYSTERECTOMY ABDOMINAL WITH BILATERAL SALPINGO OOPHERECTOMY;  Surgeon: Harlin Heys, MD;  Location: [facility] ORS;  Service: Gynecology;  Laterality: Bilateral;   BREAST BIOPSY Left 2005   benign   CERVICAL CONE BIOPSY  1990   COLONOSCOPY WITH PROPOFOL N/A 09/27/2021   Procedure: COLONOSCOPY WITH PROPOFOL;  Surgeon: Lin Landsman, MD;  Location: [facility] ENDOSCOPY;  Service: Gastroenterology;  Laterality: N/A;   NOVASURE ABLATION  2012   TUBAL LIGATION  1999    Gynecologic History:  Patient's last menstrual period was 09/23/2016 (exact date). Last Pap: Results were: 2023 no abnormalities  Last mammogram: 2023 Results were: BI-RAD I  Obstetric History: G0P0000  Family History:  Family History  Problem Relation Age of Onset   Diabetes Mother    Hypothyroidism Mother    Thyroid disease Mother    Dementia Maternal Grandmother    Stroke Maternal Grandmother    Diabetes Maternal Grandfather    Alzheimer's disease Paternal Grandmother    Hypothyroidism Son    Diabetes Maternal Uncle    Breast cancer Neg Hx  Social History:  Social History   Socioeconomic History   Marital status: Divorced    Spouse name: Not on file   Number of children: Not on file   Years of education: Not on file   Highest education level: Not on file  Occupational History   Not on file  Tobacco Use   Smoking status: Never   Smokeless tobacco: Never  Vaping Use   Vaping Use: Never used  Substance and Sexual Activity   Alcohol use: Yes    Alcohol/week: 0.0  standard drinks of alcohol    Comment: Occasional    Drug use: No   Sexual activity: Yes    Partners: Male    Birth control/protection: Surgical    Comment: Multiple   Other Topics Concern   Not on file  Social History Narrative   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   Ibuprofen Other (See Comments)    Every time she takes one dose, she gets stomach ache, dysuria.  This has happen multiple times.    Metronidazole Nausea Only    Paresthesias, dizziness   Sulfa Antibiotics Rash    Medications: Prior to Admission medications   Medication Sig Start Date End Date Taking? Authorizing Provider  albuterol (VENTOLIN HFA) 108 (90 Base) MCG/ACT inhaler Inhale 2 puffs into the lungs every 6 (six) hours as needed for wheezing or shortness of breath. 04/25/22  Yes Burnard Hawthorne, FNP  Calcium-Magnesium-Vitamin D (CALCIUM 500 PO) Take by mouth.   Yes [provider]  Cholecalciferol (VITAMIN D-3) 5000 units TABS Take 5,000 Units by mouth daily with supper.   Yes [provider]  clindamycin (CLINDAGEL) 1 % gel Apply topically 2 (two) times daily. 08/01/22  Yes Burnard Hawthorne, FNP  cyanocobalamin 1000 MCG tablet Take 1,000 mcg by mouth daily.   Yes [provider]  cyclobenzaprine (FLEXERIL) 5 MG tablet Take 1 tablet (5 mg total) by mouth at bedtime as needed. 08/17/20  Yes Gae Dry, MD  DOTTI 0.1 MG/24HR patch Place 1 patch (0.1 mg total)  onto the skin 2 (two) times a week. 08/29/21  Yes Mikaella Escalona, Nunzio Cobbs, CNM  etodolac (LODINE) 400 MG tablet Take 400 mg by mouth 2 (two) times daily.   Yes [provider]  Omega-3 Fatty Acids (FISH OIL PO) Take by mouth.   Yes [provider]  progesterone (PROMETRIUM) 100 MG capsule Take 1 capsule (100 mg total) by mouth daily. 08/30/21  Yes Geary Rufo, Nunzio Cobbs, CNM  SYNTHROID 100 MCG tablet Take 1 tablet (100 mcg total) by mouth daily before breakfast. 08/01/22  Yes Burnard Hawthorne, FNP    Physical Exam Vitals: Blood pressure 124/64, pulse 82, height 5' 5.5" (1.664 m), weight 186 lb 14.4 oz (84.8 kg), last menstrual period 09/23/2016.  General: NAD HEENT: normocephalic, anicteric Thyroid: no enlargement, no palpable nodules Pulmonary: No  increased work of breathing, CTAB Cardiovascular: RRR, distal pulses 2+ Breast: Breast symmetrical, no tenderness, no palpable nodules or masses, no skin or nipple retraction present, no nipple discharge.  No axillary or supraclavicular lymphadenopathy. Abdomen: NABS, soft, non-tender, non-distended.  Umbilicus without lesions.  No hepatomegaly, splenomegaly or masses palpable. No evidence of hernia  Genitourinary:  External: Normal external female genitalia-some atrophy, skin irritated looking-vibrant pink, left labia a little dry patch of skin noted. .  Normal urethral meatus, normal Bartholin's and Skene's glands.  Good tone  Vagina: Normal vaginal mucosa, no evidence of prolapse.  Homogenous white discharge present   Cervix: surgically absent,   Uterus: surgically absent  Adnexa: pt reports her ovaries were removed  Rectal: deferred  Lymphatic: no evidence of inguinal lymphadenopathy Extremities: no edema, erythema, or tenderness Neurologic: Grossly intact Psychiatric: mood appropriate, affect full   Assessment: 57 y.o. G0P0000 routine annual exam  Plan: Problem List Items Addressed This Visit   None   1) Mammogram -  recommend yearly screening mammogram.  Mammogram Was ordered today  2) STI screening  wasoffered and declined  3) ASCCP guidelines and rational discussed.  Patient opts for every 5 years screening interval  4) Osteoporosis  - per USPTF routine screening DEXA at age 7   Consider FDA-approved medical therapies in postmenopausal women and men aged 35 years and older, based on the following: a) A hip or vertebral (clinical or morphometric) fracture b) T-score ? -2.5 at the femoral neck or spine after appropriate evaluation to exclude secondary causes C) Low bone mass (T-score between -1.0 and -2.5 at the femoral neck or spine) and a 10-year probability of a hip fracture ? 3% or a 10-year probability of a major osteoporosis-related fracture ? 20% based on the [place]-adapted WHO algorithm   5) Routine healthcare maintenance including cholesterol, diabetes screening discussed managed by PCP  6) Colonoscopy last done 09/2021.  Screening recommended starting at age 47 for average risk individuals, age 87 for individuals deemed at increased risk (including African Americans) and recommended to continue until age 84.  For patient age 75-85 individualized approach is recommended.  Gold standard screening is via colonoscopy, Cologuard screening is an acceptable alternative for patient unwilling or unable to undergo colonoscopy.  "Colorectal cancer screening for average?risk adults: 2018 guideline update from the American Cancer Society"CA: A Cancer Journal for Clinicians: Dec 03, 2016   7) Vaginal itching: Aptima swab collected, ok to start Boric Acid, will contact pt with abnormal results. Consider increasing patch dose.    Roberto Scales, [facility] 08/29/2022, 9:07 AM

## 2022-09-01 LAB — CERVICOVAGINAL ANCILLARY ONLY
Bacterial Vaginitis (gardnerella): NEGATIVE
Candida Glabrata: NEGATIVE
Candida Vaginitis: POSITIVE — AB
Comment: NEGATIVE
Comment: NEGATIVE
Comment: NEGATIVE

## 2022-10-07 ENCOUNTER — Encounter: Payer: Self-pay | Admitting: Family

## 2022-10-09 ENCOUNTER — Other Ambulatory Visit: Payer: Self-pay | Admitting: Family

## 2022-10-09 DIAGNOSIS — J302 Other seasonal allergic rhinitis: Secondary | ICD-10-CM | POA: Insufficient documentation

## 2022-10-09 MED ORDER — PSEUDOEPHEDRINE HCL ER 120 MG PO TB12
120.0000 mg | ORAL_TABLET | Freq: Two times a day (BID) | ORAL | 3 refills | Status: AC | PRN
Start: 2022-10-09 — End: ?

## 2022-11-08 ENCOUNTER — Ambulatory Visit
Admission: RE | Admit: 2022-11-08 | Discharge: 2022-11-08 | Disposition: A | Discharge status: Home / Self Care | Payer: 59 | Source: Ambulatory Visit | Attending: Urgent Care | Admitting: Urgent Care

## 2022-11-08 VITALS — BP 153/86 | HR 73 | Temp 98.4°F | Resp 18

## 2022-11-08 DIAGNOSIS — A6920 Lyme disease, unspecified: Secondary | ICD-10-CM

## 2022-11-08 MED ORDER — DOXYCYCLINE HYCLATE 100 MG PO CAPS: 100.0000 mg | ORAL_CAPSULE | Freq: Two times a day (BID) | ORAL | 0 refills | Status: DC

## 2022-11-08 NOTE — Discharge Instructions (Addendum)
Your rash is concerning for early lymes disease. Please start taking doxycycline twice daily for 10 days. Monitor the rash to ensure it does not spread outside the line drawn today.  Do not take your antibiotic within two hours of food. Try to avoid excessive sun exposure.  Follow up with your PCP in 2 weeks, sooner if any new symptoms appear.

## 2022-11-08 NOTE — ED Triage Notes (Signed)
Patient to Urgent Care with complaints of infected insect bite present to her right rib.  Reports she was possible bitten by a chigger last weekend. Concerned about cellulitis. Area red and inflamed. Reports she has felt fatigued/ run down.

## 2022-11-08 NOTE — ED Provider Notes (Signed)
[facility]    CSN: [id number] Arrival date & time: 11/08/22  1316      History   Chief Complaint Chief Complaint  Patient presents with   Insect Bite    I was bitten by a chigger, I think, last weekend, and I'm developing cellulitis. I have a large, angry, painful red place all around the bite. - Entered by patient    HPI Bethany Bailey is a 57 y.o. female.   57yo female presents today with concern of a red, irritated, swollen area to R side of back, under bra strap. Feels like she may have been bitten by an insect last week, and noted over the past several days the area has become red and irritated. Denies removing a tick. Has not seen rash any other location. No fever or drainage from area. Does report feeling fatigued and "run down."      Past Medical History:  Diagnosis Date   GERD (gastroesophageal reflux disease)    Hypothyroid     Patient Active Problem List   Diagnosis Date Noted   Seasonal allergies 10/09/2022   Pressure injury of skin of left ischial tuberosity region 08/01/2022   Rash 08/01/2022   Bronchitis 04/11/2022   Left ankle pain 04/11/2022   Colon cancer screening    Polyp of descending colon    Ear fullness, right 10/15/2020   Urinary urgency 04/11/2019   Obesity (BMI 30.0-34.9) 03/02/2018   Spondylolisthesis at L5-S1 level 03/02/2018   Depression 02/03/2018   Cognitive change 09/14/2017   Bacterial vaginosis 01/26/2017   Hot flashes 01/14/2017   Menopause 01/14/2017   Postconcussive syndrome 01/02/2017   Impingement syndrome, shoulder, right 12/05/2016   Post-operative state 10/10/2016   Hypothyroidism 02/07/2016   Well woman exam with routine gynecological exam 02/07/2016   High risk heterosexual behavior 01/23/2015   Gastritis determined by endoscopy 11/15/2013   Heartburn 11/15/2013   Hiatal hernia 11/15/2013    Past Surgical History:  Procedure Laterality Date   ABDOMINAL HYSTERECTOMY Bilateral 10/10/2016   Procedure:  HYSTERECTOMY ABDOMINAL WITH BILATERAL SALPINGO OOPHERECTOMY;  Surgeon: Linzie Collin, MD;  Location: [facility] ORS;  Service: Gynecology;  Laterality: Bilateral;   BREAST BIOPSY Left 2005   benign   CERVICAL CONE BIOPSY  1990   COLONOSCOPY WITH PROPOFOL N/A 09/27/2021   Procedure: COLONOSCOPY WITH PROPOFOL;  Surgeon: Toney Reil, MD;  Location: [facility] ENDOSCOPY;  Service: Gastroenterology;  Laterality: N/A;   NOVASURE ABLATION  2012   TUBAL LIGATION  1999    OB History     Gravida  0   Para  0   Term  0   Preterm  0   AB  0   Living  0      SAB  0   IAB  0   Ectopic  0   Multiple  0   Live Births               Home Medications    Prior to Admission medications   Medication Sig Start Date End Date Taking? Authorizing Provider  doxycycline (VIBRAMYCIN) 100 MG capsule Take 1 capsule (100 mg total) by mouth 2 (two) times daily for 10 days. 11/08/22 11/18/22 Yes Arvetta Araque L, PA  pseudoephedrine (SUDAFED) 120 MG 12 hr tablet Take 1 tablet (120 mg total) by mouth every 12 (twelve) hours as needed for congestion. 10/09/22   Allegra Grana, FNP  albuterol (VENTOLIN HFA) 108 (90 Base) MCG/ACT inhaler Inhale 2 puffs into  the lungs every 6 (six) hours as needed for wheezing or shortness of breath. 04/25/22   Allegra Grana, FNP  Calcium-Magnesium-Vitamin D (CALCIUM 500 PO) Take by mouth.    [provider]  Cholecalciferol (VITAMIN D-3) 5000 units TABS Take 5,000 Units by mouth daily with supper.    [provider]  clindamycin (CLINDAGEL) 1 % gel Apply topically 2 (two) times daily. 08/01/22   Allegra Grana, FNP  cyanocobalamin 1000 MCG tablet Take 1,000 mcg by mouth daily.    [provider]  cyclobenzaprine (FLEXERIL) 5 MG tablet Take 1 tablet (5 mg total) by mouth at bedtime as needed. 08/17/20   Nadara Mustard, MD  DOTTI 0.1 MG/24HR patch Place 1 patch (0.1 mg total) onto the skin 2 (two) times a week. 09/01/22   Dominic, Courtney Heys, CNM  etodolac (LODINE) 400 MG tablet Take 400 mg by mouth 2 (two) times daily.    [provider]  loratadine (CLARITIN REDITABS) 10 MG dissolvable tablet Take 10 mg by mouth daily.    [provider]  Omega-3 Fatty Acids (FISH OIL PO) Take by mouth.    [provider]  progesterone (PROMETRIUM) 100 MG capsule Take 1 capsule (100 mg total) by mouth daily. 08/29/22   Dominic, Courtney Heys, CNM  SYNTHROID 100 MCG tablet Take 1 tablet (100 mcg total) by mouth daily before breakfast. 08/01/22   Allegra Grana, FNP    Family History Family History  Problem Relation Age of Onset   Diabetes Mother    Hypothyroidism Mother    Thyroid disease Mother    Dementia Maternal Grandmother    Stroke Maternal Grandmother    Diabetes Maternal Grandfather    Alzheimer's disease Paternal Grandmother    Hypothyroidism Son    Diabetes Maternal Uncle    Breast cancer Neg Hx     Social History Social History   Tobacco Use   Smoking status: Never   Smokeless tobacco: Never  Vaping Use   Vaping Use: Never used  Substance Use Topics   Alcohol use: Yes    Alcohol/week: 0.0 standard drinks of alcohol    Comment: Occasional    Drug use: No     Allergies   Aspirin, Erythromycin, Food, Ibuprofen, Metronidazole, and Sulfa antibiotics   Review of Systems Review of Systems As per HPI  Physical Exam Triage Vital Signs ED Triage Vitals  Enc Vitals Group     BP 11/08/22 1348 (!) 153/86     Pulse Rate 11/08/22 1348 73     Resp 11/08/22 1348 18     Temp 11/08/22 1348 98.4 F (36.9 C)     Temp src --      SpO2 11/08/22 1348 98 %     Weight --      Height --      Head Circumference --      Peak Flow --      Pain Score 11/08/22 1350 3     Pain Loc --      Pain Edu? --      Excl. in GC? --    No data found.  Updated Vital Signs BP (!) 153/86   Pulse 73   Temp 98.4 F (36.9 C)   Resp 18   LMP 09/23/2016 (Exact Date)   SpO2 98%   Visual Acuity Right  Eye Distance:   Left Eye Distance:   Bilateral Distance:    Right Eye Near:   Left Eye Near:  Bilateral Near:     Physical Exam Vitals and nursing note reviewed.  Constitutional:      General: She is not in acute distress.    Appearance: Normal appearance. She is not ill-appearing, toxic-appearing or diaphoretic.  HENT:     Head: Normocephalic and atraumatic.  Cardiovascular:     Rate and Rhythm: Normal rate.  Pulmonary:     Effort: Pulmonary effort is normal. No respiratory distress.  Musculoskeletal:     Cervical back: Neck supple.  Lymphadenopathy:     Cervical: No cervical adenopathy.  Skin:    General: Skin is warm and dry.     Coloration: Skin is not jaundiced or pale.     Findings: Erythema and rash (10cm x 5cm erythematous patch with target lesion appearance, see photo) present. No bruising or lesion.  Neurological:     General: No focal deficit present.     Mental Status: She is alert and oriented to person, place, and time.         UC Treatments / Results  Labs (all labs ordered are listed, but only abnormal results are displayed) Labs Reviewed - No data to display  EKG   Radiology No results found.  Procedures Procedures (including critical care time)  Medications Ordered in UC Medications - No data to display  Initial Impression / Assessment and Plan / UC Course  I have reviewed the triage vital signs and the nursing notes.  Pertinent labs & imaging results that were available during my care of the patient were reviewed by me and considered in my medical decision making (see chart for details).     Erythema migrans - rash most concerning for early lymes disease given bullseye appearance. Will start pt on doxycycline BID x 10 days. Line was drawn around the area. Recommended pt follow up with her PCP in 14 days for recheck, ER or UC if any new sx.   Final Clinical Impressions(s) / UC Diagnoses   Final diagnoses:  Erythema migrans (Lyme  disease)     Discharge Instructions      Your rash is concerning for early lymes disease. Please start taking doxycycline twice daily for 10 days. Monitor the rash to ensure it does not spread outside the line drawn today.  Do not take your antibiotic within two hours of food. Try to avoid excessive sun exposure.  Follow up with your PCP in 2 weeks, sooner if any new symptoms appear.     ED Prescriptions     Medication Sig Dispense Auth. Provider   doxycycline (VIBRAMYCIN) 100 MG capsule Take 1 capsule (100 mg total) by mouth 2 (two) times daily for 10 days. 20 capsule Maverick Patman L, PA      PDMP not reviewed this encounter.   Maretta Bees, [place] 11/08/22 [phone]

## 2022-11-17 ENCOUNTER — Encounter: Payer: Self-pay | Admitting: Family

## 2022-11-17 ENCOUNTER — Ambulatory Visit: Payer: 59 | Admitting: Family

## 2022-11-17 VITALS — BP 122/84 | HR 86 | Temp 98.0°F | Ht 65.0 in | Wt 192.8 lb

## 2022-11-17 DIAGNOSIS — M542 Cervicalgia: Secondary | ICD-10-CM | POA: Insufficient documentation

## 2022-11-17 DIAGNOSIS — M545 Low back pain, unspecified: Secondary | ICD-10-CM

## 2022-11-17 DIAGNOSIS — A6920 Lyme disease, unspecified: Secondary | ICD-10-CM | POA: Diagnosis not present

## 2022-11-17 DIAGNOSIS — G8929 Other chronic pain: Secondary | ICD-10-CM | POA: Diagnosis not present

## 2022-11-17 MED ORDER — DOXYCYCLINE HYCLATE 100 MG PO CAPS
100.0000 mg | ORAL_CAPSULE | Freq: Two times a day (BID) | ORAL | 0 refills | Status: AC
Start: 2022-11-17 — End: 2022-11-27

## 2022-11-17 MED ORDER — CYCLOBENZAPRINE HCL 10 MG PO TABS
10.0000 mg | ORAL_TABLET | Freq: Every evening | ORAL | 1 refills | Status: DC | PRN
Start: 2022-11-17 — End: 2023-08-20

## 2022-11-17 NOTE — Assessment & Plan Note (Addendum)
Patient completed the standard 10-day course of doxycycline 100 mg twice daily.  Ever so slight erythema remains present which I advised her would likely resolve on its own, with time. However I have extended doxycycline course out of an abundance of caution.  Patient would like to be on doxycycline for 3 months from what she has read.  I advised that  I feel most comfortable if we were to arrange consult with infectious disease for further advice in regards to prevention /identification of sequela of Lyme disease.  Referral placed

## 2022-11-17 NOTE — Assessment & Plan Note (Addendum)
Chronic, stable upper back and neck pain.  Referral physical therapy for massage.

## 2022-11-17 NOTE — Progress Notes (Signed)
Assessment & Plan:  Erythema chronicum migrans Assessment & Plan: Patient completed the standard 10-day course of doxycycline 100 mg twice daily.  Ever so slight erythema remains present which I advised her would likely resolve on its own, with time. However I have extended doxycycline course out of an abundance of caution.  Patient would like to be on doxycycline for 3 months from what she has read.  I advised that  I feel most comfortable if we were to arrange consult with infectious disease for further advice in regards to prevention /identification of sequela of Lyme disease.  Referral placed  Orders: -     Doxycycline Hyclate; Take 1 capsule (100 mg total) by mouth 2 (two) times daily for 10 days.  Dispense: 20 capsule; Refill: 0 -     Ambulatory referral to Infectious Disease  Cervicalgia -     Ambulatory referral to Physical Therapy -     Cyclobenzaprine HCl; Take 1 tablet (10 mg total) by mouth at bedtime as needed.  Dispense: 90 tablet; Refill: 1  Chronic low back pain, unspecified back pain laterality, unspecified whether sciatica present -     Cyclobenzaprine HCl; Take 1 tablet (10 mg total) by mouth at bedtime as needed.  Dispense: 90 tablet; Refill: 1  Neck pain Assessment & Plan: Chronic, stable upper back and neck pain.  Referral physical therapy for massage.       Return precautions given.   Risks, benefits, and alternatives of the medications and treatment plan prescribed today were discussed, and patient expressed understanding.   Education regarding symptom management and diagnosis given to patient on AVS either electronically or printed.  Return in about 6 months (around 05/20/2023).  Rennie Plowman, FNP  Subjective:    Patient ID: Bethany Bailey, female    DOB: 30-Mar-1966, 57 y.o.   MRN: [id number]  CC: Bethany Bailey is a 57 y.o. female who presents today for an acute visit.    HPI: She describes feeling something crawling on her 8 days ago on her back  after she working yard.  She did not see it but swatted it away.  5 days later she had painful itching right mid back under her bra line.  She did not pull off a tick. She saw a very red rash.  At that time when she presented to urgent care.    Redness has improved.  Today she will finish doxycycline.  She is concern for long-haul Lyme disease.    Denies headache, fever, new arthralgia, or fatigue  She has took four 12 mg tablets for ivermectin which she had at home.   Patient was seen urgent care 11/08/2022 for erythema migrans, suspected Lyme disease right side of back.  She was started on doxycycline 100 mg twice daily for 10 days.   Pictures included in the chart  Previously treated for left ischial tuberosity pain which has resolved.   She has chronic neck pain and upper back pain which is intermittent.  She is interested in insurance plan for an massage and would like a referral to   Allergies: Aspirin, Erythromycin, Food, Ibuprofen, Metronidazole, and Sulfa antibiotics Current Outpatient Medications on File Prior to Visit  Medication Sig Dispense Refill   albuterol (VENTOLIN HFA) 108 (90 Base) MCG/ACT inhaler Inhale 2 puffs into the lungs every 6 (six) hours as needed for wheezing or shortness of breath. 8 g 0   Calcium-Magnesium-Vitamin D (CALCIUM 500 PO) Take by mouth.     Cholecalciferol (  VITAMIN D-3) 5000 units TABS Take 5,000 Units by mouth daily with supper.     clindamycin (CLINDAGEL) 1 % gel Apply topically 2 (two) times daily. 30 g 2   cyanocobalamin 1000 MCG tablet Take 1,000 mcg by mouth daily.     DOTTI 0.1 MG/24HR patch Place 1 patch (0.1 mg total) onto the skin 2 (two) times a week. 8 patch 12   etodolac (LODINE) 400 MG tablet Take 400 mg by mouth 2 (two) times daily.     loratadine (CLARITIN REDITABS) 10 MG dissolvable tablet Take 10 mg by mouth daily.     Omega-3 Fatty Acids (FISH OIL PO) Take by mouth.     progesterone (PROMETRIUM) 100 MG capsule Take 1 capsule  (100 mg total) by mouth daily. 30 capsule 12   pseudoephedrine (SUDAFED) 120 MG 12 hr tablet Take 1 tablet (120 mg total) by mouth every 12 (twelve) hours as needed for congestion. 60 tablet 3   SYNTHROID 100 MCG tablet Take 1 tablet (100 mcg total) by mouth daily before breakfast. 90 tablet 3   No current facility-administered medications on file prior to visit.    Review of Systems  Constitutional:  Negative for chills and fever.  Eyes:  Negative for visual disturbance.  Respiratory:  Negative for cough.   Cardiovascular:  Negative for chest pain and palpitations.  Gastrointestinal:  Negative for nausea and vomiting.  Musculoskeletal:  Positive for neck pain. Negative for arthralgias and back pain.  Skin:  Positive for rash.  Neurological:  Negative for headaches.      Objective:    BP 122/84   Pulse 86   Temp 98 F (36.7 C) (Oral)   Ht 5\' 5"  (1.651 m)   Wt 192 lb 12.8 oz (87.5 kg)   LMP 09/23/2016 (Exact Date)   SpO2 99%   BMI 32.08 kg/m   BP Readings from Last 3 Encounters:  11/17/22 122/84  11/08/22 (!) 153/86  08/29/22 124/64   Wt Readings from Last 3 Encounters:  11/17/22 192 lb 12.8 oz (87.5 kg)  08/29/22 186 lb 14.4 oz (84.8 kg)  08/01/22 186 lb (84.4 kg)    Physical Exam Vitals reviewed.  Constitutional:      Appearance: She is well-developed.  Eyes:     Conjunctiva/sclera: Conjunctivae normal.  Cardiovascular:     Rate and Rhythm: Normal rate and regular rhythm.     Pulses: Normal pulses.     Heart sounds: Normal heart sounds.  Pulmonary:     Effort: Pulmonary effort is normal.     Breath sounds: Normal breath sounds. No wheezing, rhonchi or rales.  Skin:    General: Skin is warm and dry.          Comments: Right upper torso/flank erythema has nearly completely resolved.  2 to 3 cm area which is with mild erythema present.  No fluctuance, purulent drainage.Skin is intact  Neurological:     Mental Status: She is alert.  Psychiatric:         Speech: Speech normal.        Behavior: Behavior normal.        Thought Content: Thought content normal.

## 2022-11-17 NOTE — Patient Instructions (Signed)
Referral for massage.  Please call to schedule and let me know if you need anything else from my end.    I also placed a referral to infectious disease further discussion as it relates to sequela of Lyme disease and further antibiotic course.  We will call you to schedule an appointment with infectious disease, please let me know if do not hear from the office in the next couple of weeks.   I have sent in a 10-day course of doxycycline.   Ensure to take probiotics while on antibiotics and also for 2 weeks after completion. This can either be by eating yogurt daily or taking a probiotic supplement over the counter such as Culturelle.It is important to re-colonize the gut with good bacteria and also to prevent any diarrheal infections associated with antibiotic use.

## 2022-12-02 ENCOUNTER — Other Ambulatory Visit: Payer: Self-pay

## 2022-12-02 ENCOUNTER — Ambulatory Visit: Payer: 59 | Admitting: Infectious Diseases

## 2022-12-02 VITALS — BP 136/85 | HR 78 | Temp 98.1°F | Resp 16 | Wt 192.0 lb

## 2022-12-02 DIAGNOSIS — W57XXXA Bitten or stung by nonvenomous insect and other nonvenomous arthropods, initial encounter: Secondary | ICD-10-CM

## 2022-12-02 DIAGNOSIS — A6920 Lyme disease, unspecified: Secondary | ICD-10-CM

## 2022-12-02 DIAGNOSIS — S30860A Insect bite (nonvenomous) of lower back and pelvis, initial encounter: Secondary | ICD-10-CM | POA: Diagnosis not present

## 2022-12-02 DIAGNOSIS — R21 Rash and other nonspecific skin eruption: Secondary | ICD-10-CM

## 2022-12-02 NOTE — Progress Notes (Addendum)
Patient Active Problem List   Diagnosis Date Noted   Erythema chronicum migrans 11/17/2022   Neck pain 11/17/2022   Seasonal allergies 10/09/2022   Pressure injury of skin of left ischial tuberosity region 08/01/2022   Rash 08/01/2022   Bronchitis 04/11/2022   Left ankle pain 04/11/2022   Colon cancer screening    Polyp of descending colon    Ear fullness, right 10/15/2020   Urinary urgency 04/11/2019   Obesity (BMI 30.0-34.9) 03/02/2018   Spondylolisthesis at L5-S1 level 03/02/2018   Depression 02/03/2018   Cognitive change 09/14/2017   Bacterial vaginosis 01/26/2017   Hot flashes 01/14/2017   Menopause 01/14/2017   Postconcussive syndrome 01/02/2017   Impingement syndrome, shoulder, right 12/05/2016   Post-operative state 10/10/2016   Hypothyroidism 02/07/2016   Well woman exam with routine gynecological exam 02/07/2016   High risk heterosexual behavior 01/23/2015   Gastritis determined by endoscopy 11/15/2013   Heartburn 11/15/2013   Hiatal hernia 11/15/2013    Patient's Medications  New Prescriptions   No medications on file  Previous Medications   ALBUTEROL (VENTOLIN HFA) 108 (90 BASE) MCG/ACT INHALER    Inhale 2 puffs into the lungs every 6 (six) hours as needed for wheezing or shortness of breath.   CALCIUM-MAGNESIUM-VITAMIN D (CALCIUM 500 PO)    Take by mouth.   CHOLECALCIFEROL (VITAMIN D-3) 5000 UNITS TABS    Take 5,000 Units by mouth daily with supper.   CLINDAMYCIN (CLINDAGEL) 1 % GEL    Apply topically 2 (two) times daily.   CYANOCOBALAMIN 1000 MCG TABLET    Take 1,000 mcg by mouth daily.   CYCLOBENZAPRINE (FLEXERIL) 10 MG TABLET    Take 1 tablet (10 mg total) by mouth at bedtime as needed.   DOTTI 0.1 MG/24HR PATCH    Place 1 patch (0.1 mg total) onto the skin 2 (two) times a week.   ETODOLAC (LODINE) 400 MG TABLET    Take 400 mg by mouth 2 (two) times daily.   LORATADINE (CLARITIN REDITABS) 10 MG DISSOLVABLE TABLET    Take 10 mg by mouth daily.    OMEGA-3 FATTY ACIDS (FISH OIL PO)    Take by mouth.   PROGESTERONE (PROMETRIUM) 100 MG CAPSULE    Take 1 capsule (100 mg total) by mouth daily.   PSEUDOEPHEDRINE (SUDAFED) 120 MG 12 HR TABLET    Take 1 tablet (120 mg total) by mouth every 12 (twelve) hours as needed for congestion.   SYNTHROID 100 MCG TABLET    Take 1 tablet (100 mcg total) by mouth daily before breakfast.  Modified Medications   No medications on file  Discontinued Medications   No medications on file    Subjective: 57 YO female with PMH of GERD and Hypothyroidism who is referred by PCP for concerns of Lyme disease.   Patient reports being bit by a tick April 27th or 28th while doing yard work, this was followed by a reddish rash in the rt lateral upper back, beneath her bra strap. It was painful in the beginnning. Denies any similar rashes in the other parts of the body. Seen in the [facility] 5/4 and was thought to have Erythema migrans and was given a 10 days course of doxycycline. Seen by PCP on fu on 5/13 when rash had significantly improved with some faint redness. She was given an additional 10 days course of doxycycline and then referred to ID. She has completed both courses of doxycycline.   Denies smoking, IVDU but  alcohol occasionally.  Works as a Teacher, adult education.   Denies fevers, chills Denies nausea, vomiting, abdominal pain and diarrhea  Denies GU symptoms Denies joint pain, headache, chronic neck/upper back pain - referred to PT by PCP.  Denies blurry vision, focal weakness, numbness.   Review of Systems: all systems reviewed with pertinent positives and negatives as listed above  Past Medical History:  Diagnosis Date   GERD (gastroesophageal reflux disease)    Hypothyroid    Past Surgical History:  Procedure Laterality Date   ABDOMINAL HYSTERECTOMY Bilateral 10/10/2016   Procedure: HYSTERECTOMY ABDOMINAL WITH BILATERAL SALPINGO OOPHERECTOMY;  Surgeon: Linzie Collin, MD;  Location: [facility] ORS;  Service:  Gynecology;  Laterality: Bilateral;   BREAST BIOPSY Left 2005   benign   CERVICAL CONE BIOPSY  1990   COLONOSCOPY WITH PROPOFOL N/A 09/27/2021   Procedure: COLONOSCOPY WITH PROPOFOL;  Surgeon: Toney Reil, MD;  Location: [facility] ENDOSCOPY;  Service: Gastroenterology;  Laterality: N/A;   NOVASURE ABLATION  2012   TUBAL LIGATION  1999    Social History   Tobacco Use   Smoking status: Never   Smokeless tobacco: Never  Vaping Use   Vaping Use: Never used  Substance Use Topics   Alcohol use: Yes    Alcohol/week: 0.0 standard drinks of alcohol    Comment: Occasional    Drug use: No    Family History  Problem Relation Age of Onset   Diabetes Mother    Hypothyroidism Mother    Thyroid disease Mother    Dementia Maternal Grandmother    Stroke Maternal Grandmother    Diabetes Maternal Grandfather    Alzheimer's disease Paternal Grandmother    Hypothyroidism Son    Diabetes Maternal Uncle    Breast cancer Neg Hx     Allergies  Allergen Reactions   Aspirin Other (See Comments)    Ears ring   Erythromycin Nausea And Vomiting   Food     Wheat-coughing/sneezing & joint pain.   Ibuprofen Other (See Comments)    Every time she takes one dose, she gets stomach ache, dysuria.  This has happen multiple times.    Metronidazole Nausea Only    Paresthesias, dizziness   Sulfa Antibiotics Rash    Health Maintenance  Topic Date Due   Zoster Vaccines- Shingrix (1 of 2) Never done   COVID-19 Vaccine (3 - 2023-24 season) 03/07/2022   INFLUENZA VACCINE  02/05/2023   MAMMOGRAM  11/07/2023   PAP SMEAR-Modifier  08/26/2024   DTaP/Tdap/Td (3 - Td or Tdap) 10/16/2030   Colonoscopy  09/28/2031   Hepatitis C Screening  Completed   HIV Screening  Completed   HPV VACCINES  Aged Out    Objective: BP 136/85   Pulse 78   Temp 98.1 F (36.7 C) (Oral)   Resp 16   Wt 192 lb (87.1 kg)   LMP 09/23/2016 (Exact Date)   SpO2 100%   BMI 31.95 kg/m   Physical Exam Constitutional:       Appearance: Normal appearance.  HENT:     Head: Normocephalic and atraumatic.      Mouth: Mucous membranes are moist.  Eyes:    Conjunctiva/sclera: Conjunctivae normal.     Pupils: Pupils are equal, round, and bilaterally symmetrical   Cardiovascular:     Rate and Rhythm: Normal rate and regular rhythm.     Heart sounds: s1s2  Pulmonary:     Effort: Pulmonary effort is normal.     Breath sounds: Normal breath sounds.  Abdominal:     General: Non distended     Palpations: soft.   Musculoskeletal:        General: Normal range of motion.   Skin:    General: Skin is warm and dry.     Comments: Rt upper back redness has almost resolved, no rashes at other parts.   Neurological:     General: grossly non focal     Mental Status: awake, alert and oriented to person, place, and time.   Psychiatric:        Mood and Affect: Mood normal.   Lab Results Lab Results  Component Value Date   WBC 7.8 08/26/2021   HGB 13.1 08/26/2021   HCT 39.1 08/26/2021   MCV 90 08/26/2021   PLT 232 08/26/2021    Lab Results  Component Value Date   CREATININE 0.77 08/26/2021   BUN 22 08/26/2021   NA 139 08/26/2021   K 4.3 08/26/2021   CL 105 08/26/2021   CO2 23 08/26/2021    Lab Results  Component Value Date   ALT 25 08/26/2021   AST 21 08/26/2021   ALKPHOS 49 08/26/2021   BILITOT 0.3 08/26/2021    Lab Results  Component Value Date   CHOL 223 (H) 08/26/2021   HDL 65 08/26/2021   LDLCALC 135 (H) 08/26/2021   TRIG 128 08/26/2021   CHOLHDL 3.4 08/26/2021   Lab Results  Component Value Date   LABRPR Non Reactive 08/26/2021   No results found for: "HIV1RNAQUANT", "HIV1RNAVL", "CD4TABS"   Assessment/Plan # Possible Erythema Migrans # Tick bite S/p 2 courses of Doxycyline already prescribed on 5/4 and  Rash has almost resolved No need for further tx Will check for serology to aid in retrospective diagnosis, however no change in management anticipated  Fu as needed   I have  personally spent 67 minutes involved in face-to-face and non-face-to-face activities for this patient on the day of the visit. Professional time spent includes the following activities: Preparing to see the patient (review of tests), Obtaining and/or reviewing separately obtained history (admission/discharge record), Performing a medically appropriate examination and/or evaluation , Ordering medications/tests/procedures, referring and communicating with other health care professionals, Documenting clinical information in the EMR, Independently interpreting results (not separately reported), Communicating results to the patient/family/caregiver, Counseling and educating the patient/family/caregiver and Care coordination (not separately reported).   Victoriano Lain, MD [facility] 12/02/2022, 8:57 AM

## 2022-12-03 DIAGNOSIS — W57XXXA Bitten or stung by nonvenomous insect and other nonvenomous arthropods, initial encounter: Secondary | ICD-10-CM | POA: Insufficient documentation

## 2022-12-03 LAB — B. BURGDORFI ANTIBODIES: B burgdorferi Ab IgG+IgM: <0.9 index

## 2022-12-10 ENCOUNTER — Encounter: Payer: Self-pay | Admitting: Family

## 2022-12-10 ENCOUNTER — Ambulatory Visit: Payer: 59 | Admitting: Family

## 2022-12-10 VITALS — BP 130/76 | HR 76 | Temp 98.4°F | Ht 65.0 in | Wt 193.4 lb

## 2022-12-10 DIAGNOSIS — M79604 Pain in right leg: Secondary | ICD-10-CM

## 2022-12-10 NOTE — Patient Instructions (Addendum)
I have ordered ultrasound to evaluate for any superficial mass in your right thigh.  Let us know if you dont hear back within a week in regards to an appointment being scheduled.   So that you are aware, if you are [facility] MyChart user , please pay attention to your MyChart messages as you may receive a MyChart message with a phone number to call and schedule this test/appointment own your own from our referral coordinator. This is a new process so I do not want you to miss this message.  If you are not a MyChart user, you will receive a phone call.   Hip Bursitis Rehab Ask your health care provider which exercises are safe for you. Do exercises exactly as told by your health care provider and adjust them as directed. It is normal to feel mild stretching, pulling, tightness, or discomfort as you do these exercises. Stop right away if you feel sudden pain or your pain gets worse. Do not begin these exercises until told by your health care provider. Stretching exercise This exercise warms up your muscles and joints and improves the movement and flexibility of your hip. This exercise also helps to relieve pain and stiffness. Iliotibial band stretch An iliotibial band is a strong band of muscle tissue that runs from the outer side of your hip to the outer side of your thigh and knee. Lie on your side with your left / right leg in the top position. Bend your left / right knee and grab your ankle. Stretch out your bottom arm to help you balance. Slowly bring your knee back so your thigh is slightly behind your body. Slowly lower your knee toward the floor until you feel a gentle stretch on the outside of your left / right thigh. If you do not feel a stretch and your knee will not lower more toward the floor, place the heel of your other foot on top of your knee and pull your knee down toward the floor with your foot. Hold this position for __________ seconds. Slowly return to the starting position. Repeat  __________ times. Complete this exercise __________ times a day. Strengthening exercises These exercises build strength and endurance in your hip and pelvis. Endurance is the ability to use your muscles for a long time, even after they get tired. Bridge This exercise strengthens the muscles that move your thigh backward (hip extensors). Lie on your back on a firm surface with your knees bent and your feet flat on the floor. Tighten your buttocks muscles and lift your buttocks off the floor until your trunk is level with your thighs. Do not arch your back. You should feel the muscles working in your buttocks and the back of your thighs. If you do not feel these muscles, slide your feet 1-2 inches (2.5-5 cm) farther away from your buttocks. If this exercise is too easy, try doing it with your arms crossed over your chest. Hold this position for __________ seconds. Slowly lower your hips to the starting position. Let your muscles relax completely after each repetition. Repeat __________ times. Complete this exercise __________ times a day. Squats This exercise strengthens the muscles in front of your thigh and knee (quadriceps). Stand in front of a table, with your feet and knees pointing straight ahead. You may rest your hands on the table for balance but not for support. Slowly bend your knees and lower your hips like you are going to sit in a chair. Keep your weight over  your heels, not over your toes. Keep your lower legs upright so they are parallel with the table legs. Do not let your hips go lower than your knees. Do not bend lower than told by your health care provider. If your hip pain increases, do not bend as low. Hold the squat position for __________ seconds. Slowly push with your legs to return to standing. Do not use your hands to pull yourself to standing. Repeat __________ times. Complete this exercise __________ times a day. Hip hike  Stand sideways on a bottom step. Stand  on your left / right leg with your other foot unsupported next to the step. You can hold on to the railing or wall for balance if needed. Keep your knees straight and your torso square. Then lift your left / right hip up toward the ceiling. Hold this position for __________ seconds. Slowly let your left / right hip lower toward the floor, past the starting position. Your foot should get closer to the floor. Do not lean or bend your knees. Repeat __________ times. Complete this exercise __________ times a day. Single leg stand This exercise increases your balance. Without shoes, stand near a railing or in a doorway. You may hold on to the railing or door frame as needed for balance. Squeeze your left / right buttock muscles, then lift up your other foot. Do not let your left / right hip push out to the side. It is helpful to stand in front of a mirror for this exercise so you can watch your hip. Hold this position for __________ seconds. Repeat __________ times. Complete this exercise __________ times a day. This information is not intended to replace advice given to you by your health care provider. Make sure you discuss any questions you have with your health care provider. Document Revised: 06/05/2021 Document Reviewed: 06/05/2021 Elsevier Patient Education  2024 [employer].

## 2022-12-10 NOTE — Progress Notes (Signed)
Assessment & Plan:  Right leg pain Assessment & Plan: Etiology unclear.  Question if trochanteric bursa this is aggravating.  Presentation is not consistent with IT syndrome.  No claudication symptoms.  X-ray lumbar spine, sacrum, bilateral hips 08/01/2022.  Degenerative changes in bilateral inferior SI joints.  Mild curvature of the lumbar spine.  Lower lumbar facet degenerative changes.    Pending ultrasound to look for any superficial mass.  Pursue CT if symptoms persist. Close follow up.     Orders: -     Comprehensive metabolic panel -     CBC with Differential/Platelet -     B12 and Folate Panel -     US SOFT TISSUE LOWER EXTREMITY LIMITED RIGHT (NON-VASCULAR); Future     Return precautions given.   Risks, benefits, and alternatives of the medications and treatment plan prescribed today were discussed, and patient expressed understanding.   Education regarding symptom management and diagnosis given to patient on AVS either electronically or printed.  No follow-ups on file.  Rennie Plowman, FNP  Subjective:    Patient ID: Bethany Bailey, female    DOB: 12/12/1965, 57 y.o.   MRN: [id number]  CC: Bethany Bailey is a 57 y.o. female who presents today for an acute visit.    HPI: Complains of right upper leg pain.    Describes having a 'full body' deep tissue massage with onset afterwards .   Describes right upper thigh deep pain, x 8 weeks,episodic. She didn't have pain during the passage and then 2 days later, she felt 'sore' and since she has felt it worse.    She noticed that anterior thigh area was swollen. She will feel 'band' wrapping around upper thigh and groin. Cramping on right lateral hip. She will have pain on right hip. Left hip pain has resolved.   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  She will cold versus heat, throb,  or stabbing sensation.  It is uncomfortable to sleep on right hip.  No pain in  calves with walking, numbness, saddle anesthesia, urinary or bowel incontinence.     She has tried stretching with some relief. No relief from flexeril.    X-ray lumbar spine, sacrum, bilateral hips 08/01/2022.  Degenerative changes in bilateral inferior SI joints.  Mild curvature of the lumbar spine.  Lower lumbar facet degenerative changes.    Allergies: Aspirin, Erythromycin, Food, Ibuprofen, Metronidazole, and Sulfa antibiotics Current Outpatient Medications on File Prior to Visit  Medication Sig Dispense Refill   albuterol (VENTOLIN HFA) 108 (90 Base) MCG/ACT inhaler Inhale 2 puffs into the lungs every 6 (six) hours as needed for wheezing or shortness of breath. 8 g 0   Calcium-Magnesium-Vitamin D (CALCIUM 500 PO) Take by mouth.     Cholecalciferol (VITAMIN D-3) 5000 units TABS Take 5,000 Units by mouth daily with supper.     clindamycin (CLINDAGEL) 1 % gel Apply topically 2 (two) times daily. 30 g 2   cyanocobalamin 1000 MCG tablet Take 1,000 mcg by mouth daily.     cyclobenzaprine (FLEXERIL) 10 MG tablet Take 1 tablet (10 mg total) by mouth at bedtime as needed. 90 tablet 1   DOTTI 0.1 MG/24HR patch Place 1 patch (0.1 mg total) onto the skin 2 (two) times a week. 8 patch 12   etodolac (LODINE) 400 MG tablet Take 400 mg by mouth 2 (two) times daily.     loratadine (CLARITIN REDITABS) 10 MG dissolvable tablet Take  10 mg by mouth daily.     Omega-3 Fatty Acids (FISH OIL PO) Take by mouth.     progesterone (PROMETRIUM) 100 MG capsule Take 1 capsule (100 mg total) by mouth daily. 30 capsule 12   pseudoephedrine (SUDAFED) 120 MG 12 hr tablet Take 1 tablet (120 mg total) by mouth every 12 (twelve) hours as needed for congestion. 60 tablet 3   SYNTHROID 100 MCG tablet Take 1 tablet (100 mcg total) by mouth daily before breakfast. 90 tablet 3   No current facility-administered medications on file prior to visit.    Review of Systems  Constitutional:  Negative for chills and fever.   Respiratory:  Negative for cough.   Cardiovascular:  Negative for chest pain, palpitations and leg swelling.  Gastrointestinal:  Negative for nausea and vomiting.  Musculoskeletal:  Positive for back pain.  Neurological:  Negative for numbness.      Objective:    BP 130/76   Pulse 76   Temp 98.4 F (36.9 C) (Oral)   Ht 5\' 5"  (1.651 m)   Wt 193 lb 6.4 oz (87.7 kg)   LMP 09/23/2016 (Exact Date)   SpO2 99%   BMI 32.18 kg/m   BP Readings from Last 3 Encounters:  12/10/22 130/76  12/02/22 136/85  11/17/22 122/84   Wt Readings from Last 3 Encounters:  12/10/22 193 lb 6.4 oz (87.7 kg)  12/02/22 192 lb (87.1 kg)  11/17/22 192 lb 12.8 oz (87.5 kg)    Physical Exam Vitals reviewed.  Constitutional:      Appearance: She is well-developed.  Eyes:     Conjunctiva/sclera: Conjunctivae normal.  Cardiovascular:     Rate and Rhythm: Normal rate and regular rhythm.     Pulses: Normal pulses.     Heart sounds: Normal heart sounds.     Comments: No LE edema, palpable cords or masses. No erythema or increased warmth. No asymmetry in calf size when compared bilaterally LE hair growth symmetric and present. No discoloration or varicosities noted. LE warm and palpable pedal pulses.  Pulmonary:     Effort: Pulmonary effort is normal.     Breath sounds: Normal breath sounds. No wheezing, rhonchi or rales.  Musculoskeletal:     Lumbar back: No swelling, edema, spasms, tenderness or bony tenderness. Normal range of motion.     Right upper leg: No swelling, tenderness or bony tenderness.     Left upper leg: No swelling, tenderness or bony tenderness.     Right lower leg: No edema.     Left lower leg: No edema.       Legs:     Comments: Full range of motion with flexion, tension, lateral side bends. No bony tenderness. No pain, numbness, tingling elicited with single leg raise bilaterally.  With palpation of bilateral upper thighs, no discriminate mass.  No particularly noticeable  asymmetry.  Questionable area of which is more dense on the right ventral thigh as marked on diagram.  No erythema, increased warmth.  Skin is intact  Skin:    General: Skin is warm and dry.  Neurological:     Mental Status: She is alert.     Sensory: No sensory deficit.     Deep Tendon Reflexes:     Reflex Scores:      Patellar reflexes are 2+ on the right side and 2+ on the left side.    Comments: Sensation and strength intact bilateral lower extremities.  Psychiatric:  Speech: Speech normal.        Behavior: Behavior normal.        Thought Content: Thought content normal.

## 2022-12-11 LAB — CBC WITH DIFFERENTIAL/PLATELET
Basophils Absolute: 0.1 10*3/uL (ref 0.0–0.1)
Basophils Relative: 1.2 % (ref 0.0–3.0)
Eosinophils Absolute: 0.1 10*3/uL (ref 0.0–0.7)
Eosinophils Relative: 0.9 % (ref 0.0–5.0)
HCT: 39.4 % (ref 36.0–46.0)
Hemoglobin: 13.2 g/dL (ref 12.0–15.0)
Lymphocytes Relative: 26.2 % (ref 12.0–46.0)
Lymphs Abs: 2.3 10*3/uL (ref 0.7–4.0)
MCHC: 33.5 g/dL (ref 30.0–36.0)
MCV: 91.1 fl (ref 78.0–100.0)
Monocytes Absolute: 0.7 10*3/uL (ref 0.1–1.0)
Monocytes Relative: 7.5 % (ref 3.0–12.0)
Neutro Abs: 5.7 10*3/uL (ref 1.4–7.7)
Neutrophils Relative %: 64.2 % (ref 43.0–77.0)
Platelets: 279 10*3/uL (ref 150.0–400.0)
RBC: 4.32 Mil/uL (ref 3.87–5.11)
RDW: 12.8 % (ref 11.5–15.5)
WBC: 8.9 10*3/uL (ref 4.0–10.5)

## 2022-12-11 LAB — COMPREHENSIVE METABOLIC PANEL
ALT: 19 U/L (ref 0–35)
AST: 17 U/L (ref 0–37)
Albumin: 4.5 g/dL (ref 3.5–5.2)
Alkaline Phosphatase: 37 U/L — ABNORMAL LOW (ref 39–117)
BUN: 19 mg/dL (ref 6–23)
CO2: 27 mEq/L (ref 19–32)
Calcium: 9.5 mg/dL (ref 8.4–10.5)
Chloride: 104 mEq/L (ref 96–112)
Creatinine, Ser: 0.69 mg/dL (ref 0.40–1.20)
GFR: 97.76 mL/min (ref >60.00)
Glucose, Bld: 85 mg/dL (ref 70–99)
Potassium: 4.3 mEq/L (ref 3.5–5.1)
Sodium: 140 mEq/L (ref 135–145)
Total Bilirubin: 0.4 mg/dL (ref 0.2–1.2)
Total Protein: 7.5 g/dL (ref 6.0–8.3)

## 2022-12-11 LAB — B12 AND FOLATE PANEL
Folate: 22.4 ng/mL (ref >5.9)
Vitamin B-12: 953 pg/mL — ABNORMAL HIGH (ref 211–911)

## 2022-12-12 NOTE — Assessment & Plan Note (Addendum)
Etiology unclear.  Question if trochanteric bursa this is aggravating.  Presentation is not consistent with IT syndrome.  No claudication symptoms.  X-ray lumbar spine, sacrum, bilateral hips 08/01/2022.  Degenerative changes in bilateral inferior SI joints.  Mild curvature of the lumbar spine.  Lower lumbar facet degenerative changes.    Pending ultrasound to look for any superficial mass.  Pursue CT if symptoms persist. Close follow up.

## 2022-12-14 ENCOUNTER — Encounter: Payer: Self-pay | Admitting: Family

## 2022-12-15 ENCOUNTER — Ambulatory Visit: Payer: 59 | Admitting: Family

## 2022-12-15 NOTE — Telephone Encounter (Signed)
Noted  

## 2022-12-15 NOTE — Telephone Encounter (Signed)
Pt called back. As per pt, she's unable to make it for the appt today because she has a client at that time. As per pt, she's doing better today, less pain and swollen went down.

## 2022-12-17 ENCOUNTER — Other Ambulatory Visit: Payer: Self-pay | Admitting: Family

## 2022-12-18 ENCOUNTER — Ambulatory Visit
Admission: RE | Admit: 2022-12-18 | Discharge: 2022-12-18 | Disposition: A | Discharge status: Home / Self Care | Payer: 59 | Source: Ambulatory Visit | Attending: Family | Admitting: Family

## 2022-12-18 DIAGNOSIS — R2241 Localized swelling, mass and lump, right lower limb: Secondary | ICD-10-CM | POA: Diagnosis not present

## 2022-12-18 DIAGNOSIS — M79604 Pain in right leg: Secondary | ICD-10-CM | POA: Diagnosis not present

## 2022-12-26 ENCOUNTER — Telehealth: Payer: Self-pay

## 2022-12-26 NOTE — Telephone Encounter (Signed)
-----   Message from Allegra Grana, FNP sent at 12/19/2022  9:31 AM EDT ----- Call patient Patient has not viewed MyChart result note.  Please review my chart note in detail with patient.   Please let me know if questions

## 2022-12-26 NOTE — Telephone Encounter (Signed)
LMTCB in regards to lab results below.    Jenell,   Labs stable.  B12 elevated which is appropriate if you are taking a B12 supplement.     Regards, Claris Che

## 2023-01-30 ENCOUNTER — Telehealth: Payer: Self-pay | Admitting: Obstetrics and Gynecology

## 2023-01-30 ENCOUNTER — Encounter: Payer: Self-pay | Admitting: Family

## 2023-01-30 ENCOUNTER — Ambulatory Visit (INDEPENDENT_AMBULATORY_CARE_PROVIDER_SITE_OTHER): Payer: 59 | Admitting: Family

## 2023-01-30 VITALS — BP 130/76 | HR 82 | Temp 97.9°F | Ht 65.0 in | Wt 186.6 lb

## 2023-01-30 DIAGNOSIS — E039 Hypothyroidism, unspecified: Secondary | ICD-10-CM

## 2023-01-30 DIAGNOSIS — R232 Flushing: Secondary | ICD-10-CM

## 2023-01-30 DIAGNOSIS — Z13220 Encounter for screening for lipoid disorders: Secondary | ICD-10-CM | POA: Diagnosis not present

## 2023-01-30 DIAGNOSIS — R1011 Right upper quadrant pain: Secondary | ICD-10-CM

## 2023-01-30 DIAGNOSIS — Z136 Encounter for screening for cardiovascular disorders: Secondary | ICD-10-CM

## 2023-01-30 DIAGNOSIS — M79604 Pain in right leg: Secondary | ICD-10-CM | POA: Diagnosis not present

## 2023-01-30 DIAGNOSIS — Z1231 Encounter for screening mammogram for malignant neoplasm of breast: Secondary | ICD-10-CM

## 2023-01-30 LAB — COMPREHENSIVE METABOLIC PANEL
ALT: 24 U/L (ref 0–35)
AST: 20 U/L (ref 0–37)
Albumin: 4.6 g/dL (ref 3.5–5.2)
Alkaline Phosphatase: 40 U/L (ref 39–117)
BUN: 17 mg/dL (ref 6–23)
CO2: 29 mEq/L (ref 19–32)
Calcium: 9.6 mg/dL (ref 8.4–10.5)
Chloride: 103 mEq/L (ref 96–112)
Creatinine, Ser: 0.63 mg/dL (ref 0.40–1.20)
GFR: 99.83 mL/min (ref >60.00)
Glucose, Bld: 103 mg/dL — ABNORMAL HIGH (ref 70–99)
Potassium: 4.3 mEq/L (ref 3.5–5.1)
Sodium: 139 mEq/L (ref 135–145)
Total Bilirubin: 0.6 mg/dL (ref 0.2–1.2)
Total Protein: 7.5 g/dL (ref 6.0–8.3)

## 2023-01-30 LAB — URINALYSIS, ROUTINE W REFLEX MICROSCOPIC
Bilirubin Urine: NEGATIVE
Hgb urine dipstick: NEGATIVE
Ketones, ur: NEGATIVE
Leukocytes,Ua: NEGATIVE
Nitrite: NEGATIVE
RBC / HPF: NONE SEEN (ref 0–?)
Specific Gravity, Urine: 1.01 (ref 1.000–1.030)
Total Protein, Urine: NEGATIVE
Urine Glucose: NEGATIVE
Urobilinogen, UA: 0.2 (ref 0.0–1.0)
WBC, UA: NONE SEEN (ref 0–?)
pH: 6 (ref 5.0–8.0)

## 2023-01-30 LAB — LIPID PANEL
Cholesterol: 233 mg/dL — ABNORMAL HIGH (ref 0–200)
HDL: 73.3 mg/dL (ref >39.00)
LDL Cholesterol: 138 mg/dL — ABNORMAL HIGH (ref 0–99)
NonHDL: 159.91
Total CHOL/HDL Ratio: 3
Triglycerides: 112 mg/dL (ref 0.0–149.0)
VLDL: 22.4 mg/dL (ref 0.0–40.0)

## 2023-01-30 LAB — TSH: TSH: 0.9 u[IU]/mL (ref 0.35–5.50)

## 2023-01-30 NOTE — Patient Instructions (Addendum)
Please review surgical history under your MyChart as I am concerned the physician is not accurate.  It is Dr. Logan Bores listed there however does not appear to be Dr. Madelin Headings as we discussed today.  Please let me know after you review if record needs to be corrected.   consider pepcid ac daily or acid reflux  I have ordered ultrasound of gallbladder.  Please pay close attention to symptom while we are in this evaluation phase.  If anything were to change or develop new symptoms, please let me know right away.  I have also placed a referral to [facility] in regards to hormone replacement therapy.  Let us know if you dont hear back within a week in regards to an appointment being scheduled.   So that you are aware, if you are [facility] My[facility] user , please pay attention to your MyChart messages as you may receive a MyChart message with a phone number to call and schedule this test/appointment own your own from our referral coordinator. This is a new process so I do not want you to miss this message.  If you are not a MyChart user, you will receive a phone call.

## 2023-01-30 NOTE — Assessment & Plan Note (Signed)
Improved at this time.  Patient politely declines further evaluation at this time.  We discussed MRI of the hip, physical therapy, orthopedic referral.  She declines at this time and will let me know if she would like to pursue further evaluation.

## 2023-01-30 NOTE — Telephone Encounter (Signed)
The patient is calling to find out why in her mychart it indicates Dr. Elonda Husky as the surgeon on file for her hysterectomy  for 09/2016 when it was Dr. Augustina Mood with [facility]. She saw Dr. Augustina Mood prior to him leaving to [place].The patient states "she has never sen Dr. Elonda Husky in her life". She realized this when his photo came up on her my chart.  The patient would like someone to look into this and update to the correct provider. She has tried contacting [facility] medical records department and they have routed her back to our office. Please advise? The patient would like an update on this, please contact this patient with any questions!

## 2023-01-30 NOTE — Progress Notes (Signed)
Assessment & Plan:  RUQ abdominal pain -     Comprehensive metabolic panel -     Urinalysis, Routine w reflex microscopic -     Urine Culture -     US ABDOMEN LIMITED RUQ (LIVER/GB); Future  Encounter for screening mammogram for malignant neoplasm of breast -     3D Screening Mammogram, Left and Right; Future  Hot flashes -     Ambulatory referral to Obstetrics / Gynecology  Encounter for lipid screening for cardiovascular disease -     Lipid panel  Hypothyroidism, unspecified type Assessment & Plan: Pending TSH.   Continue Synthroid 100 mcg for now.   Orders: -     TSH  Right leg pain Assessment & Plan: Improved at this time.  Patient politely declines further evaluation at this time.  We discussed MRI of the hip, physical therapy, orthopedic referral.  She declines at this time and will let me know if she would like to pursue further evaluation.       Return precautions given.   Risks, benefits, and alternatives of the medications and treatment plan prescribed today were discussed, and patient expressed understanding.   Education regarding symptom management and diagnosis given to patient on AVS either electronically or printed.  No follow-ups on file.  Rennie Plowman, FNP  Subjective:    Patient ID: Bethany Bailey, female    DOB: 07-22-1965, 57 y.o.   MRN: [id number]  CC: Bethany Bailey is a 57 y.o. female who presents today for follow up.   HPI: Right lateral hip pain has improved. Swelling resolved.   She is concerned with low testosterone since bilateral nephrectomy, hysterectomy. She is concerned in regards to fatigue, hair loss this is related to low testosterone.  She is compliant with estradiol patch as initially prescribed by nurse midwife.    Complains of episodic right flank pain lasting 2-3 seconds, which started 2 days ago.   Burning , sharp pain. 'like a hot poker.'  Not r/t movement.  Noticed after lunch yesterday.  She has not tried  medications for this.  She has chronic belching and burping which is unchanged.   She has h/o GERD  H/o hiatal hernia  No fever, constipation, nausea, dysuria, hematuria, epigastric burning.       Hysterectomy, bilateral oophorectomy 2018, Dr. Madelin Headings  Allergies: Aspirin, Erythromycin, Food, Ibuprofen, Metronidazole, and Sulfa antibiotics Current Outpatient Medications on File Prior to Visit  Medication Sig Dispense Refill   albuterol (VENTOLIN HFA) 108 (90 Base) MCG/ACT inhaler Inhale 2 puffs into the lungs every 6 (six) hours as needed for wheezing or shortness of breath. 8 g 0   Calcium-Magnesium-Vitamin D (CALCIUM 500 PO) Take by mouth.     Cholecalciferol (VITAMIN D-3) 5000 units TABS Take 5,000 Units by mouth daily with supper.     clindamycin (CLINDAGEL) 1 % gel Apply topically 2 (two) times daily. 30 g 2   cyanocobalamin 1000 MCG tablet Take 1,000 mcg by mouth daily.     cyclobenzaprine (FLEXERIL) 10 MG tablet Take 1 tablet (10 mg total) by mouth at bedtime as needed. 90 tablet 1   DOTTI 0.1 MG/24HR patch Place 1 patch (0.1 mg total) onto the skin 2 (two) times a week. 8 patch 12   etodolac (LODINE) 400 MG tablet Take 400 mg by mouth 2 (two) times daily.     loratadine (CLARITIN REDITABS) 10 MG dissolvable tablet Take 10 mg by mouth daily.     Omega-3 Fatty  Acids (FISH OIL PO) Take by mouth.     progesterone (PROMETRIUM) 100 MG capsule Take 1 capsule (100 mg total) by mouth daily. 30 capsule 12   pseudoephedrine (SUDAFED) 120 MG 12 hr tablet Take 1 tablet (120 mg total) by mouth every 12 (twelve) hours as needed for congestion. 60 tablet 3   SYNTHROID 100 MCG tablet Take 1 tablet (100 mcg total) by mouth daily before breakfast. 90 tablet 3   No current facility-administered medications on file prior to visit.    Review of Systems  Constitutional:  Negative for chills and fever.  HENT:  Negative for congestion.   Respiratory:  Negative for cough, shortness of breath  and wheezing.   Cardiovascular:  Negative for chest pain and palpitations.  Gastrointestinal:  Positive for abdominal pain. Negative for abdominal distention, blood in stool, constipation, nausea and vomiting.  Genitourinary:  Negative for dysuria.  Musculoskeletal:  Positive for arthralgias (right hip). Negative for joint swelling.      Objective:    BP 130/76   Pulse 82   Temp 97.9 F (36.6 C) (Oral)   Ht 5\' 5"  (1.651 m)   Wt 186 lb 9.6 oz (84.6 kg)   LMP 09/23/2016 (Exact Date)   SpO2 98%   BMI 31.05 kg/m  BP Readings from Last 3 Encounters:  01/30/23 130/76  12/10/22 130/76  12/02/22 136/85   Wt Readings from Last 3 Encounters:  01/30/23 186 lb 9.6 oz (84.6 kg)  12/10/22 193 lb 6.4 oz (87.7 kg)  12/02/22 192 lb (87.1 kg)    Physical Exam Vitals reviewed.  Constitutional:      Appearance: Normal appearance. She is well-developed.  Eyes:     Conjunctiva/sclera: Conjunctivae normal.  Cardiovascular:     Rate and Rhythm: Normal rate and regular rhythm.     Pulses: Normal pulses.     Heart sounds: Normal heart sounds.  Pulmonary:     Effort: Pulmonary effort is normal.     Breath sounds: Normal breath sounds. No wheezing, rhonchi or rales.  Abdominal:     General: Bowel sounds are normal. There is no distension.     Palpations: Abdomen is soft. Abdomen is not rigid. There is no fluid wave or mass.     Tenderness: There is no abdominal tenderness. There is no guarding or rebound.  Musculoskeletal:     Lumbar back: No lacerations, tenderness or bony tenderness. Normal range of motion. Negative right straight leg raise test and negative left straight leg raise test.     Comments: Right Hip: No limp or waddling gait.  No pain with deep palpation of greater trochanter.     Skin:    General: Skin is warm and dry.  Neurological:     Mental Status: She is alert.  Psychiatric:        Speech: Speech normal.        Behavior: Behavior normal.        Thought Content:  Thought content normal.

## 2023-01-30 NOTE — Assessment & Plan Note (Signed)
Pending TSH.   Continue Synthroid 100 mcg for now.

## 2023-01-30 NOTE — Telephone Encounter (Signed)
Attempted to contact patient to discuss, LVM. Patient has a clinic and surgical history with Dr. Logan Bores and [facility] starting in 2018. Will await for her call to discuss further.

## 2023-02-01 ENCOUNTER — Other Ambulatory Visit: Payer: Self-pay | Admitting: Family

## 2023-02-01 DIAGNOSIS — R7309 Other abnormal glucose: Secondary | ICD-10-CM

## 2023-02-02 NOTE — Telephone Encounter (Signed)
The patient is calling the office to speak with Ladona Ridgel. Returning call. Please contact this patient.

## 2023-02-02 NOTE — Telephone Encounter (Signed)
Attempted to contact, LVM

## 2023-02-04 ENCOUNTER — Ambulatory Visit
Admission: RE | Admit: 2023-02-04 | Discharge: 2023-02-04 | Disposition: A | Discharge status: Home / Self Care | Payer: 59 | Source: Ambulatory Visit | Attending: Family | Admitting: Family

## 2023-02-04 DIAGNOSIS — Z1231 Encounter for screening mammogram for malignant neoplasm of breast: Secondary | ICD-10-CM | POA: Diagnosis not present

## 2023-02-04 NOTE — Telephone Encounter (Signed)
Spoke with patient and resolved concerns.

## 2023-02-05 ENCOUNTER — Ambulatory Visit
Admission: RE | Admit: 2023-02-05 | Discharge: 2023-02-05 | Disposition: A | Discharge status: Home / Self Care | Payer: 59 | Source: Ambulatory Visit | Attending: Family | Admitting: Family

## 2023-02-05 DIAGNOSIS — R1011 Right upper quadrant pain: Secondary | ICD-10-CM | POA: Insufficient documentation

## 2023-02-05 DIAGNOSIS — K7689 Other specified diseases of liver: Secondary | ICD-10-CM | POA: Diagnosis not present

## 2023-02-09 ENCOUNTER — Other Ambulatory Visit: Payer: Self-pay | Admitting: Family

## 2023-02-09 DIAGNOSIS — K760 Fatty (change of) liver, not elsewhere classified: Secondary | ICD-10-CM | POA: Insufficient documentation

## 2023-02-09 NOTE — Telephone Encounter (Signed)
Noted  

## 2023-02-10 ENCOUNTER — Telehealth: Payer: Self-pay

## 2023-02-10 NOTE — Telephone Encounter (Signed)
-----   Message from Rennie Plowman sent at 02/09/2023  1:25 PM EDT ----- Call patient Patient has not viewed MyChart result note.  Urine culture doesn't reveal infection ; it appears contaminated likely external and internal colonoizers  Please asked patient if she is experiencing urinary frequency, burning with urination.  If she has any urinary symptoms, I would advise to recollect urine.  Please educate on how to obtain clean-catch

## 2023-02-10 NOTE — Telephone Encounter (Signed)
See message below, left vociemail for a call back. Okay to give results

## 2023-02-16 ENCOUNTER — Telehealth: Payer: Self-pay

## 2023-02-16 NOTE — Telephone Encounter (Signed)
LVM to call back to office to ask patient if she is experiencing urinary frequency, burning with urination.  If she has any urinary symptoms, I would advise to recollect urine

## 2023-02-18 NOTE — Telephone Encounter (Signed)
Pt returned [facility] CMA call. call was transferred.

## 2023-02-23 DIAGNOSIS — D225 Melanocytic nevi of trunk: Secondary | ICD-10-CM | POA: Diagnosis not present

## 2023-02-23 DIAGNOSIS — L814 Other melanin hyperpigmentation: Secondary | ICD-10-CM | POA: Diagnosis not present

## 2023-02-23 DIAGNOSIS — L718 Other rosacea: Secondary | ICD-10-CM | POA: Diagnosis not present

## 2023-02-23 DIAGNOSIS — D2262 Melanocytic nevi of left upper limb, including shoulder: Secondary | ICD-10-CM | POA: Diagnosis not present

## 2023-02-23 DIAGNOSIS — D2272 Melanocytic nevi of left lower limb, including hip: Secondary | ICD-10-CM | POA: Diagnosis not present

## 2023-02-23 DIAGNOSIS — D2271 Melanocytic nevi of right lower limb, including hip: Secondary | ICD-10-CM | POA: Diagnosis not present

## 2023-02-23 DIAGNOSIS — D2261 Melanocytic nevi of right upper limb, including shoulder: Secondary | ICD-10-CM | POA: Diagnosis not present

## 2023-03-03 ENCOUNTER — Encounter: Payer: 59 | Admitting: Obstetrics and Gynecology

## 2023-03-03 DIAGNOSIS — N951 Menopausal and female climacteric states: Secondary | ICD-10-CM

## 2023-03-31 ENCOUNTER — Encounter: Payer: Self-pay | Admitting: Family

## 2023-03-31 NOTE — Telephone Encounter (Signed)
Spoke to pt scheduled appt for 04/02/23

## 2023-04-02 ENCOUNTER — Ambulatory Visit: Payer: 59 | Admitting: Family

## 2023-04-02 VITALS — BP 124/70 | HR 80 | Temp 97.9°F | Resp 16 | Wt 188.0 lb

## 2023-04-02 DIAGNOSIS — R21 Rash and other nonspecific skin eruption: Secondary | ICD-10-CM

## 2023-04-02 MED ORDER — CLINDAMYCIN PHOSPHATE 1 % EX GEL
Freq: Two times a day (BID) | CUTANEOUS | 2 refills | Status: AC
Start: 2023-04-02 — End: ?

## 2023-04-02 NOTE — Assessment & Plan Note (Addendum)
Differential includes seborrheic dermatitis of the scalp versus folliculitis versus psoriasis.  As patient has had benefit with Clindagel, I think is reasonable to continue. I have refilled.  If not, advise I more suspicious of seborrheic dermatitis of the scalp I would offer ketoconazole shampoo and consider topical corticosteroid in combination.

## 2023-04-02 NOTE — Progress Notes (Signed)
Assessment & Plan:  Rash Assessment & Plan: Differential includes seborrheic dermatitis of the scalp versus folliculitis versus psoriasis.  As patient has had benefit with Clindagel, I think is reasonable to continue. I have refilled.  If not, advise I more suspicious of seborrheic dermatitis of the scalp I would offer ketoconazole shampoo and consider topical corticosteroid in combination.     Orders: -     Clindamycin Phosphate; Apply topically 2 (two) times daily.  Dispense: 30 g; Refill: 2     Return precautions given.   Risks, benefits, and alternatives of the medications and treatment plan prescribed today were discussed, and patient expressed understanding.   Education regarding symptom management and diagnosis given to patient on AVS either electronically or printed.  No follow-ups on file.  Rennie Plowman, FNP  Subjective:    Patient ID: Bethany Bailey, female    DOB: 1965/11/22, 57 y.o.   MRN: [id number]  CC: Bethany Bailey is a 57 y.o. female who presents today for an acute visit.    HPI: Complains of posterior scalp rash, improved, x 6 weeks. Itchy. No flakes.    She has been using tea tree oil for roseasa 6 weeks ago when noticed rahs.  She is worried hair follicles may have 'gotten infected' Worse after Labor Day and swimming at the beach.   She has been using salicylic acid shampoo twice daily on the evening and zinc shampoo.  5 days ago she started Clindagel 1 application daily with improvement.  Allergies: Aspirin, Erythromycin, Food, Ibuprofen, Metronidazole, and Sulfa antibiotics Current Outpatient Medications on File Prior to Visit  Medication Sig Dispense Refill   albuterol (VENTOLIN HFA) 108 (90 Base) MCG/ACT inhaler Inhale 2 puffs into the lungs every 6 (six) hours as needed for wheezing or shortness of breath. 8 g 0   Calcium-Magnesium-Vitamin D (CALCIUM 500 PO) Take by mouth.     Cholecalciferol (VITAMIN D-3) 5000 units TABS Take 5,000 Units by  mouth daily with supper.     cyanocobalamin 1000 MCG tablet Take 1,000 mcg by mouth daily.     cyclobenzaprine (FLEXERIL) 10 MG tablet Take 1 tablet (10 mg total) by mouth at bedtime as needed. 90 tablet 1   DOTTI 0.1 MG/24HR patch Place 1 patch (0.1 mg total) onto the skin 2 (two) times a week. 8 patch 12   etodolac (LODINE) 400 MG tablet Take 400 mg by mouth 2 (two) times daily.     loratadine (CLARITIN REDITABS) 10 MG dissolvable tablet Take 10 mg by mouth daily.     Omega-3 Fatty Acids (FISH OIL PO) Take by mouth.     progesterone (PROMETRIUM) 100 MG capsule Take 1 capsule (100 mg total) by mouth daily. 30 capsule 12   pseudoephedrine (SUDAFED) 120 MG 12 hr tablet Take 1 tablet (120 mg total) by mouth every 12 (twelve) hours as needed for congestion. 60 tablet 3   SYNTHROID 100 MCG tablet Take 1 tablet (100 mcg total) by mouth daily before breakfast. 90 tablet 3   No current facility-administered medications on file prior to visit.    Review of Systems  Constitutional:  Negative for chills and fever.  Respiratory:  Negative for cough.   Cardiovascular:  Negative for chest pain and palpitations.  Gastrointestinal:  Negative for nausea and vomiting.  Skin:  Positive for rash.      Objective:    BP 124/70   Pulse 80   Temp 97.9 F (36.6 C)   Resp 16  Wt 188 lb (85.3 kg)   LMP 09/23/2016 (Exact Date)   SpO2 98%   BMI 31.28 kg/m   BP Readings from Last 3 Encounters:  04/02/23 124/70  01/30/23 130/76  12/10/22 130/76   Wt Readings from Last 3 Encounters:  04/02/23 188 lb (85.3 kg)  01/30/23 186 lb 9.6 oz (84.6 kg)  12/10/22 193 lb 6.4 oz (87.7 kg)    Physical Exam Vitals reviewed.  Constitutional:      Appearance: She is well-developed.  Eyes:     Conjunctiva/sclera: Conjunctivae normal.  Cardiovascular:     Rate and Rhythm: Normal rate and regular rhythm.     Pulses: Normal pulses.     Heart sounds: Normal heart sounds.  Pulmonary:     Effort: Pulmonary effort  is normal.     Breath sounds: Normal breath sounds. No wheezing, rhonchi or rales.  Skin:    General: Skin is warm and dry.          Comments: Posterior scalp, discrete white patchy lesions approximately 1 cm in diameter.  Scant yellow flakes present.  Neurological:     Mental Status: She is alert.  Psychiatric:        Speech: Speech normal.        Behavior: Behavior normal.        Thought Content: Thought content normal.

## 2023-04-02 NOTE — Patient Instructions (Signed)
As you have had benefit with Clindagel, I think is reasonable to continue. I have refilled.  If doesn't improve, advise I more suspicious of seborrheic dermatitis of the scalp I would offer ketoconazole shampoo and consider topical corticosteroid in combination.   Please let me know how you are doing

## 2023-04-07 ENCOUNTER — Encounter: Payer: Self-pay | Admitting: Obstetrics and Gynecology

## 2023-04-07 ENCOUNTER — Ambulatory Visit: Payer: 59 | Admitting: Obstetrics and Gynecology

## 2023-04-07 VITALS — BP 135/87 | HR 76 | Ht 65.0 in | Wt 190.2 lb

## 2023-04-07 DIAGNOSIS — N951 Menopausal and female climacteric states: Secondary | ICD-10-CM

## 2023-04-07 MED ORDER — ESTROGENS CONJUGATED 0.3 MG PO TABS
0.3000 mg | ORAL_TABLET | Freq: Every day | ORAL | 0 refills | Status: DC
Start: 2023-04-07 — End: 2023-08-20

## 2023-04-07 NOTE — Progress Notes (Signed)
Patient presents today to discuss a possible deficiency. She states she believes she may have a testosterone deficiency. Over the last year she has had symptoms of fatigue, muscle weakness, dry skin, mood swings and anxiety. She currently uses biweekly estradiol patched and Prometrium.

## 2023-04-07 NOTE — Progress Notes (Signed)
HPI:      [name] is a 57 y.o. G0P0000 who LMP was Patient's last menstrual period was 09/23/2016 (exact date).  Subjective:   She presents today stating that she feels like her hormones are off.  She has had an increased difficulty sleeping, lack of energy, difficulty achieving orgasm, hair loss etc.  She is currently using estrogen Vivelle-Dot patches and taking Prometrium.  (?) She is not sure why she is taking Prometrium although she does state that she thinks it originally helped with her sleep.  She has a recent TSH which is normal.  A recent CMP is normal. Of significant note patient has had a previous hysterectomy.    Hx: The following portions of the patient's history were reviewed and updated as appropriate:             She  has a past medical history of GERD (gastroesophageal reflux disease) and Hypothyroid. She does not have any pertinent problems on file. She  has a past surgical history that includes Tubal ligation (1999); Novasure ablation (2012); Cervical cone biopsy (1990); Abdominal hysterectomy (Bilateral, 10/10/2016); Breast biopsy (Left, 2005); and Colonoscopy with propofol (N/A, 09/27/2021). Her family history includes Alzheimer's disease in her paternal grandmother; Dementia in her maternal grandmother; Diabetes in her maternal grandfather, maternal uncle, and mother; Hypothyroidism in her mother and son; Stroke in her maternal grandmother; Thyroid disease in her mother. She  reports that she has never smoked. She has never used smokeless tobacco. She reports current alcohol use. She reports that she does not use drugs. She has a current medication list which includes the following prescription(s): calcium carbonate, vitamin d-3, cyanocobalamin, cyclobenzaprine, dotti, etodolac, loratadine, omega-3 fatty acids, prasterone (dhea), progesterone, pseudoephedrine, selenium, synthroid, albuterol, clindamycin, and estrogens (conjugated). She is allergic to aspirin,  erythromycin, food, ibuprofen, metronidazole, and sulfa antibiotics.       Review of Systems:  Review of Systems  Constitutional: Denied constitutional symptoms, night sweats, recent illness, fatigue, fever, insomnia and weight loss.  Eyes: Denied eye symptoms, eye pain, photophobia, vision change and visual disturbance.  Ears/Nose/Throat/Neck: Denied ear, nose, throat or neck symptoms, hearing loss, nasal discharge, sinus congestion and sore throat.  Cardiovascular: Denied cardiovascular symptoms, arrhythmia, chest pain/pressure, edema, exercise intolerance, orthopnea and palpitations.  Respiratory: Denied pulmonary symptoms, asthma, pleuritic pain, productive sputum, cough, dyspnea and wheezing.  Gastrointestinal: Denied, gastro-esophageal reflux, melena, nausea and vomiting.  Genitourinary: Denied genitourinary symptoms including symptomatic vaginal discharge, pelvic relaxation issues, and urinary complaints.  Musculoskeletal: Denied musculoskeletal symptoms, stiffness, swelling, muscle weakness and myalgia.  Dermatologic: Denied dermatology symptoms, rash and scar.  Neurologic: Denied neurology symptoms, dizziness, headache, neck pain and syncope.  Psychiatric: Denied psychiatric symptoms, anxiety and depression.  Endocrine: See HPI for additional information.   Meds:   Current Outpatient Medications on File Prior to Visit  Medication Sig Dispense Refill   Calcium-Magnesium-Vitamin D (CALCIUM 500 PO) Take by mouth.     Cholecalciferol (VITAMIN D-3) 5000 units TABS Take 5,000 Units by mouth daily with supper.     cyanocobalamin 1000 MCG tablet Take 1,000 mcg by mouth daily.     cyclobenzaprine (FLEXERIL) 10 MG tablet Take 1 tablet (10 mg total) by mouth at bedtime as needed. 90 tablet 1   DOTTI 0.1 MG/24HR patch Place 1 patch (0.1 mg total) onto the skin 2 (two) times a week. 8 patch 12   etodolac (LODINE) 400 MG tablet Take 400 mg by mouth 2 (two) times daily.  loratadine (CLARITIN  REDITABS) 10 MG dissolvable tablet Take 10 mg by mouth daily.     Omega-3 Fatty Acids (FISH OIL PO) Take by mouth.     Prasterone, DHEA, (DHEA PO) Take by mouth.     progesterone (PROMETRIUM) 100 MG capsule Take 1 capsule (100 mg total) by mouth daily. 30 capsule 12   pseudoephedrine (SUDAFED) 120 MG 12 hr tablet Take 1 tablet (120 mg total) by mouth every 12 (twelve) hours as needed for congestion. 60 tablet 3   SELENIUM PO Take by mouth.     SYNTHROID 100 MCG tablet Take 1 tablet (100 mcg total) by mouth daily before breakfast. 90 tablet 3   albuterol (VENTOLIN HFA) 108 (90 Base) MCG/ACT inhaler Inhale 2 puffs into the lungs every 6 (six) hours as needed for wheezing or shortness of breath. (Patient not taking: Reported on 04/07/2023) 8 g 0   clindamycin (CLINDAGEL) 1 % gel Apply topically 2 (two) times daily. (Patient not taking: Reported on 04/07/2023) 30 g 2   No current facility-administered medications on file prior to visit.      Objective:     Vitals:   04/07/23 1024  BP: 135/87  Pulse: 76   Filed Weights   04/07/23 1024  Weight: 190 lb 3.2 oz (86.3 kg)                        Assessment:    G0P0000 Patient Active Problem List   Diagnosis Date Noted   Fatty liver disease, nonalcoholic 02/09/2023   RUQ abdominal pain 01/30/2023   Right leg pain 12/10/2022   Tick bite of back 12/03/2022   Erythema chronicum migrans 11/17/2022   Neck pain 11/17/2022   Seasonal allergies 10/09/2022   Pressure injury of skin of left ischial tuberosity region 08/01/2022   Rash 08/01/2022   Bronchitis 04/11/2022   Left ankle pain 04/11/2022   Colon cancer screening    Polyp of descending colon    Ear fullness, right 10/15/2020   Urinary urgency 04/11/2019   Obesity (BMI 30.0-34.9) 03/02/2018   Spondylolisthesis at L5-S1 level 03/02/2018   Depression 02/03/2018   Cognitive change 09/14/2017   Bacterial vaginosis 01/26/2017   Hot flashes 01/14/2017   Menopause 01/14/2017    Postconcussive syndrome 01/02/2017   Impingement syndrome, shoulder, right 12/05/2016   Post-operative state 10/10/2016   Hypothyroidism 02/07/2016   Well woman exam with routine gynecological exam 02/07/2016   High risk heterosexual behavior 01/23/2015   Gastritis determined by endoscopy 11/15/2013   Heartburn 11/15/2013   Hiatal hernia 11/15/2013     1. Symptomatic menopausal or female climacteric states        Plan:            1.  We have discussed Prometrium and I have advised her to stop this medication.  2.  Will slightly increase her estrogen dose daily to see if this makes any difference with the above symptoms.  3.  Consideration for vaginal estrogen cream in the future.  Orders No orders of the defined types were placed in this encounter.    Meds ordered this encounter  Medications   estrogens, conjugated, (PREMARIN) 0.3 MG tablet    Sig: Take 1 tablet (0.3 mg total) by mouth daily. Take daily for 21 days then do not take for 7 days.    Dispense:  50 tablet    Refill:  0      F/U  Return in about 6 weeks (  around 05/19/2023) for Video visit if desired. I spent 22 minutes involved in the care of this patient preparing to see the patient by obtaining and reviewing her medical history (including labs, imaging tests and prior procedures), documenting clinical information in the electronic health record (EHR), counseling and coordinating care plans, writing and sending prescriptions, ordering tests or procedures and in direct communicating with the patient and medical staff discussing pertinent items from her history and physical exam.  Elonda Husky, M.D. 04/07/2023 11:00 AM

## 2023-04-14 DIAGNOSIS — H25813 Combined forms of age-related cataract, bilateral: Secondary | ICD-10-CM | POA: Diagnosis not present

## 2023-04-16 ENCOUNTER — Other Ambulatory Visit: Payer: Self-pay

## 2023-04-16 DIAGNOSIS — N951 Menopausal and female climacteric states: Secondary | ICD-10-CM

## 2023-04-16 MED ORDER — ESTRADIOL 1 MG PO TABS
1.0000 mg | ORAL_TABLET | Freq: Every day | ORAL | 3 refills | Status: DC
Start: 2023-04-16 — End: 2023-08-20

## 2023-04-16 MED ORDER — PROGESTERONE 200 MG PO CAPS
200.0000 mg | ORAL_CAPSULE | Freq: Every day | ORAL | 3 refills | Status: DC
Start: 2023-04-16 — End: 2023-08-20

## 2023-04-16 NOTE — Progress Notes (Signed)
Bethany Bailey, please give her a 90 day supply of Estrace generic estradiol 1 mg tablets one per day. using [facility] this is generally less than $25 for three months worth. Also a prescription for Prometrium 200 mg one per day Thank you   Medications sent in, LVM for patient to make her aware of new medications and instructions.

## 2023-04-16 NOTE — Telephone Encounter (Signed)
Attempted to reach pt by phone, LVM. Medications called in to on file pharmacy. Will send mychart message too.

## 2023-04-20 ENCOUNTER — Encounter: Payer: Self-pay | Admitting: Gastroenterology

## 2023-04-20 ENCOUNTER — Ambulatory Visit: Payer: 59 | Admitting: Gastroenterology

## 2023-04-20 VITALS — BP 154/84 | HR 76 | Temp 97.7°F | Ht 65.0 in | Wt 193.1 lb

## 2023-04-20 DIAGNOSIS — Z6832 Body mass index (BMI) 32.0-32.9, adult: Secondary | ICD-10-CM | POA: Diagnosis not present

## 2023-04-20 DIAGNOSIS — K760 Fatty (change of) liver, not elsewhere classified: Secondary | ICD-10-CM

## 2023-04-20 NOTE — Progress Notes (Signed)
Arlyss Repress, MD [address]  [address]  [place], [place] [address]  Main: [phone]  Fax: [phone]    Gastroenterology Consultation  Referring Provider:     Allegra Grana, FNP Primary Care Physician:  Allegra Grana, FNP Primary Gastroenterologist:  Dr. Arlyss Repress Reason for Consultation: Fatty liver        HPI:   Bethany Bailey is a 57 y.o. female referred by Allegra Grana, FNP  for consultation & management of recent finding of hepatic steatosis based on ultrasound liver.  Ultrasound was otherwise unremarkable.  Patient does not have any GI symptoms.  She reports that since June 2024 she is working on eating healthy, cutting back on carbohydrates.  Her liver enzymes have been normal over the last few years Viral hepatitis panel negative She does not smoke or drink alcohol  NSAIDs: None  Antiplts/Anticoagulants/Anti thrombotics: None  GI Procedures:  Screening colonoscopy 2023 - The examined portion of the ileum was normal. - One 6 mm polyp in the descending colon, removed with a cold snare. Resected and retrieved. Clip ( MR conditional) was placed. - The distal rectum and anal verge are normal on retroflexion view. DIAGNOSIS:  A. COLON POLYP, DESCENDING; COLD SNARE:  - ONE POLYPOID FRAGMENT WITH CRYPT HYPERPLASIA AND DILATATION, AND  INFLAMMATION, CONSISTENT WITH INFLAMMATORY POLYP  - ONE FRAGMENT WITH FEATURES OF HYPERPLASTIC POLYP.  - ONE FRAGMENT WITH PROMINENT LYMPHOID AGGREGATE.  - NEGATIVE FOR DYSPLASIA AND MALIGNANCY.   Past Medical History:  Diagnosis Date   GERD (gastroesophageal reflux disease)    Hypothyroid     Past Surgical History:  Procedure Laterality Date   ABDOMINAL HYSTERECTOMY Bilateral 10/10/2016   Procedure: HYSTERECTOMY ABDOMINAL WITH BILATERAL SALPINGO OOPHERECTOMY;  Surgeon: Linzie Collin, MD;  Location: [facility] ORS;  Service: Gynecology;  Laterality: Bilateral;   BREAST BIOPSY Left 2005   benign    CERVICAL CONE BIOPSY  1990   COLONOSCOPY WITH PROPOFOL N/A 09/27/2021   Procedure: COLONOSCOPY WITH PROPOFOL;  Surgeon: Toney Reil, MD;  Location: [facility] ENDOSCOPY;  Service: Gastroenterology;  Laterality: N/A;   NOVASURE ABLATION  2012   TUBAL LIGATION  1999     Current Outpatient Medications:    Calcium-Magnesium-Vitamin D (CALCIUM 500 PO), Take by mouth., Disp: , Rfl:    Cholecalciferol (VITAMIN D-3) 5000 units TABS, Take 5,000 Units by mouth daily with supper., Disp: , Rfl:    clindamycin (CLINDAGEL) 1 % gel, Apply topically 2 (two) times daily., Disp: 30 g, Rfl: 2   cyanocobalamin 1000 MCG tablet, Take 1,000 mcg by mouth daily., Disp: , Rfl:    cyclobenzaprine (FLEXERIL) 10 MG tablet, Take 1 tablet (10 mg total) by mouth at bedtime as needed., Disp: 90 tablet, Rfl: 1   DOTTI 0.1 MG/24HR patch, Place 1 patch (0.1 mg total) onto the skin 2 (two) times a week., Disp: 8 patch, Rfl: 12   estradiol (ESTRACE) 1 MG tablet, Take 1 tablet (1 mg total) by mouth daily., Disp: 90 tablet, Rfl: 3   estrogens, conjugated, (PREMARIN) 0.3 MG tablet, Take 1 tablet (0.3 mg total) by mouth daily. Take daily for 21 days then do not take for 7 days., Disp: 50 tablet, Rfl: 0   etodolac (LODINE) 400 MG tablet, Take 400 mg by mouth 2 (two) times daily., Disp: , Rfl:    loratadine (CLARITIN REDITABS) 10 MG dissolvable tablet, Take 10 mg by mouth daily., Disp: , Rfl:    Omega-3 Fatty Acids (  FISH OIL PO), Take by mouth., Disp: , Rfl:    pseudoephedrine (SUDAFED) 120 MG 12 hr tablet, Take 1 tablet (120 mg total) by mouth every 12 (twelve) hours as needed for congestion., Disp: 60 tablet, Rfl: 3   SELENIUM PO, Take by mouth., Disp: , Rfl:    SYNTHROID 100 MCG tablet, Take 1 tablet (100 mcg total) by mouth daily before breakfast., Disp: 90 tablet, Rfl: 3   albuterol (VENTOLIN HFA) 108 (90 Base) MCG/ACT inhaler, Inhale 2 puffs into the lungs every 6 (six) hours as needed for wheezing or shortness of breath.  (Patient not taking: Reported on 04/07/2023), Disp: 8 g, Rfl: 0   Prasterone, DHEA, (DHEA PO), Take by mouth. (Patient not taking: Reported on 04/20/2023), Disp: , Rfl:    progesterone (PROMETRIUM) 200 MG capsule, Take 1 capsule (200 mg total) by mouth daily. Place one capsule vaginally at bedtime (Patient not taking: Reported on 04/20/2023), Disp: 30 capsule, Rfl: 3   Family History  Problem Relation Age of Onset   Diabetes Mother    Hypothyroidism Mother    Thyroid disease Mother    Dementia Maternal Grandmother    Stroke Maternal Grandmother    Diabetes Maternal Grandfather    Alzheimer's disease Paternal Grandmother    Hypothyroidism Son    Diabetes Maternal Uncle    Breast cancer Neg Hx      Social History   Tobacco Use   Smoking status: Never   Smokeless tobacco: Never  Vaping Use   Vaping status: Never Used  Substance Use Topics   Alcohol use: Yes    Alcohol/week: 0.0 standard drinks of alcohol    Comment: Occasional    Drug use: No    Allergies as of 04/20/2023 - Review Complete 04/20/2023  Allergen Reaction Noted   Aspirin Other (See Comments) 11/14/2013   Erythromycin Nausea And Vomiting 11/14/2013   Food  10/01/2016   Ibuprofen Other (See Comments) 04/11/2022   Metronidazole Nausea Only 01/30/2017   Sulfa antibiotics Rash 11/14/2013    Review of Systems:    All systems reviewed and negative except where noted in HPI.   Physical Exam:  BP (!) 154/84 (BP Location: Left Arm, Patient Position: Sitting, Cuff Size: Normal)   Pulse 76   Temp 97.7 F (36.5 C) (Oral)   Ht 5\' 5"  (1.651 m)   Wt 193 lb 2 oz (87.6 kg)   LMP 09/23/2016 (Exact Date)   BMI 32.14 kg/m  Patient's last menstrual period was 09/23/2016 (exact date).  General:   Alert,  Well-developed, well-nourished, pleasant and cooperative in NAD Head:  Normocephalic and atraumatic. Eyes:  Sclera clear, no icterus.   Conjunctiva pink. Ears:  Normal auditory acuity. Nose:  No deformity, discharge,  or lesions. Mouth:  No deformity or lesions,oropharynx pink & moist. Neck:  Supple; no masses or thyromegaly. Lungs:  Respirations even and unlabored.  Clear throughout to auscultation.   No wheezes, crackles, or rhonchi. No acute distress. Heart:  Regular rate and rhythm; no murmurs, clicks, rubs, or gallops. Abdomen:  Normal bowel sounds. Soft, non-tender and non-distended without masses, hepatosplenomegaly or hernias noted.  No guarding or rebound tenderness.   Rectal: Not performed Msk:  Symmetrical without gross deformities. Good, equal movement & strength bilaterally. Pulses:  Normal pulses noted. Extremities:  No clubbing or edema.  No cyanosis. Neurologic:  Alert and oriented x3;  grossly normal neurologically. Skin:  Intact without significant lesions or rashes. No jaundice. Psych:  Alert and cooperative. Normal mood and  affect.  Imaging Studies: Reviewed  Assessment and Plan:   Bethany Bailey is a 57 y.o. female with BMI 32 is seen in consultation for steatosis.  LFTs have been normal.  Viral hepatitis panel negative  Fibrosis 4 Score = .8 (Low risk)        Interpretation for patients with NAFLD          <1.30       -  F0-F1 (Low risk)          1.30-2.67 -  Indeterminate           >2.67      -  F3-F4 (High risk)     Validated for ages 14-65  Discussed about healthy lifestyle including healthy eating habits, weight loss and increased physical activity/exercise, risk of progression of fatty liver to NASH, fibrosis and cirrhosis  Can repeat ultrasound liver in 1 year, recheck LFTs in 1 year  Patient felt reassured  Follow up as needed   Arlyss Repress, MD

## 2023-05-19 ENCOUNTER — Telehealth: Payer: 59 | Admitting: Obstetrics and Gynecology

## 2023-08-16 ENCOUNTER — Other Ambulatory Visit: Payer: Self-pay | Admitting: Family

## 2023-08-16 DIAGNOSIS — E039 Hypothyroidism, unspecified: Secondary | ICD-10-CM

## 2023-08-17 ENCOUNTER — Encounter: Payer: Self-pay | Admitting: Family

## 2023-08-20 ENCOUNTER — Encounter: Payer: Self-pay | Admitting: Family

## 2023-08-20 ENCOUNTER — Ambulatory Visit (INDEPENDENT_AMBULATORY_CARE_PROVIDER_SITE_OTHER): Payer: 59 | Admitting: Family

## 2023-08-20 VITALS — BP 122/78 | HR 62 | Temp 97.9°F | Ht 65.0 in | Wt 195.2 lb

## 2023-08-20 DIAGNOSIS — E039 Hypothyroidism, unspecified: Secondary | ICD-10-CM | POA: Diagnosis not present

## 2023-08-20 DIAGNOSIS — N951 Menopausal and female climacteric states: Secondary | ICD-10-CM

## 2023-08-20 DIAGNOSIS — Z79890 Hormone replacement therapy (postmenopausal): Secondary | ICD-10-CM | POA: Diagnosis not present

## 2023-08-20 DIAGNOSIS — M545 Low back pain, unspecified: Secondary | ICD-10-CM

## 2023-08-20 DIAGNOSIS — R1011 Right upper quadrant pain: Secondary | ICD-10-CM

## 2023-08-20 DIAGNOSIS — G8929 Other chronic pain: Secondary | ICD-10-CM

## 2023-08-20 DIAGNOSIS — M542 Cervicalgia: Secondary | ICD-10-CM

## 2023-08-20 DIAGNOSIS — Z13220 Encounter for screening for lipoid disorders: Secondary | ICD-10-CM

## 2023-08-20 DIAGNOSIS — Z136 Encounter for screening for cardiovascular disorders: Secondary | ICD-10-CM

## 2023-08-20 DIAGNOSIS — M79604 Pain in right leg: Secondary | ICD-10-CM

## 2023-08-20 LAB — COMPREHENSIVE METABOLIC PANEL
ALT: 14 U/L (ref 0–35)
AST: 14 U/L (ref 0–37)
Albumin: 4.2 g/dL (ref 3.5–5.2)
Alkaline Phosphatase: 48 U/L (ref 39–117)
BUN: 19 mg/dL (ref 6–23)
CO2: 29 meq/L (ref 19–32)
Calcium: 9 mg/dL (ref 8.4–10.5)
Chloride: 100 meq/L (ref 96–112)
Creatinine, Ser: 0.68 mg/dL (ref 0.40–1.20)
GFR: 97.63 mL/min (ref >60.00)
Glucose, Bld: 89 mg/dL (ref 70–99)
Potassium: 4.2 meq/L (ref 3.5–5.1)
Sodium: 138 meq/L (ref 135–145)
Total Bilirubin: 0.4 mg/dL (ref 0.2–1.2)
Total Protein: 7 g/dL (ref 6.0–8.3)

## 2023-08-20 LAB — TSH: TSH: 1.57 u[IU]/mL (ref 0.35–5.50)

## 2023-08-20 MED ORDER — CYCLOBENZAPRINE HCL 10 MG PO TABS
10.0000 mg | ORAL_TABLET | Freq: Every evening | ORAL | 1 refills | Status: AC | PRN
Start: 2023-08-20 — End: ?

## 2023-08-20 MED ORDER — ESTRADIOL 1 MG PO TABS
1.0000 mg | ORAL_TABLET | Freq: Every day | ORAL | 3 refills | Status: DC
Start: 2023-08-20 — End: 2023-09-08

## 2023-08-20 MED ORDER — SYNTHROID 100 MCG PO TABS
100.0000 ug | ORAL_TABLET | Freq: Every day | ORAL | 3 refills | Status: DC
Start: 2023-08-20 — End: 2023-09-08

## 2023-08-20 MED ORDER — ESTRADIOL 0.5 MG PO TABS: 0.2500 mg | ORAL_TABLET | Freq: Every day | ORAL | 3 refills | Status: DC

## 2023-08-20 MED ORDER — PROGESTERONE 200 MG PO CAPS
200.0000 mg | ORAL_CAPSULE | Freq: Every day | ORAL | 3 refills | Status: DC
Start: 2023-08-20 — End: 2023-09-08

## 2023-08-20 NOTE — Progress Notes (Signed)
Assessment & Plan:  Hormone replacement therapy (HRT) Assessment & Plan: Long discussion in regards to the risk of hormone replacement therapy including breast cancer, ASCVD.  We also discussed history of a hysterectomy and therefore she would not require progesterone.  Reviewed visits with OB/GYN, Dr. Logan Bores.  Upon chart review, it appears progesterone 200 mg was sent in by Dr. Logan Bores however sig indicated vaginal route.  Patient was using vaginally; however it appears Dr. Logan Bores intended this to be p.o.  I have refilled progesterone 200 mg daily.  Patient will stop Dotti estrogen patch and instead I will increase estradiol to 1.25 mg daily.  Discussed with patient estrogen appropriate for shortest duration, lowest dose.  We will revisit how to wean off in the coming years.  Patient verbalized understanding of all.   RUQ abdominal pain -     Comprehensive metabolic panel  Hypothyroidism, unspecified type Assessment & Plan: Pending TSH.   Continue Synthroid 100 mcg   Orders: -     TSH -     Synthroid; Take 1 tablet (100 mcg total) by mouth daily before breakfast.  Dispense: 90 tablet; Refill: 3  Encounter for lipid screening for cardiovascular disease  Right leg pain  Symptomatic menopausal or female climacteric states -     Progesterone; Take 1 capsule (200 mg total) by mouth daily.  Dispense: 90 capsule; Refill: 3 -     Estradiol; Take 1 tablet (1 mg total) by mouth daily. Take with estradiol 0.25mg  QD  Dispense: 90 tablet; Refill: 3  Vasomotor symptoms due to menopause -     Progesterone; Take 1 capsule (200 mg total) by mouth daily.  Dispense: 90 capsule; Refill: 3 -     Estradiol; Take 1 tablet (1 mg total) by mouth daily. Take with estradiol 0.25mg  QD  Dispense: 90 tablet; Refill: 3  Vaginal dryness, menopausal -     Progesterone; Take 1 capsule (200 mg total) by mouth daily.  Dispense: 90 capsule; Refill: 3 -     Estradiol; Take 1 tablet (1 mg total) by mouth daily. Take with  estradiol 0.25mg  QD  Dispense: 90 tablet; Refill: 3  Cervicalgia -     Cyclobenzaprine HCl; Take 1 tablet (10 mg total) by mouth at bedtime as needed.  Dispense: 90 tablet; Refill: 1  Chronic low back pain, unspecified back pain laterality, unspecified whether sciatica present -     Cyclobenzaprine HCl; Take 1 tablet (10 mg total) by mouth at bedtime as needed.  Dispense: 90 tablet; Refill: 1  Other orders -     Estradiol; Take 0.5 tablets (0.25 mg total) by mouth daily. Take with estradiol 1mg  qd  Dispense: 90 tablet; Refill: 3     Return precautions given.   Risks, benefits, and alternatives of the medications and treatment plan prescribed today were discussed, and patient expressed understanding.   Education regarding symptom management and diagnosis given to patient on AVS either electronically or printed.  Return for Complete Physical Exam.  Rennie Plowman, FNP  Subjective:    Patient ID: Bethany Bailey, female    DOB: May 23, 1966, 58 y.o.   MRN: [id number]  CC: Bethany Bailey is a 58 y.o. female who presents today for follow up.   HPI: Overall feels well today.   She has been taking estradiol 1 mg daily along with estrogen patch 0.1 mg.  Estrogen patches become more expensive and she would like to increase estradiol and stop using the patch.  Estrogen has been  significantly helpful for depression and fatigue.    No personal or family h/o breast cancer, ASCVD  H/o hysterectomy  She started vaginal progesterone tablet as prescribed by Dr Logan Bores which was irritating.  Per chart review, he advised her to discontinue progesterone 04/07/2023. she is requesting a refill of progesterone 200 mg which she also requested over Mychart message to Dr Logan Bores 04/07/23.    She has been taking progesterone for years and has found it helpful for sleep.  She would like to continue taking due to the benefit of the medication.  She understands it is not recommended in the setting of a  hysterectomy  Allergies: Aspirin, Erythromycin, Food, Ibuprofen, Metronidazole, and Sulfa antibiotics Current Outpatient Medications on File Prior to Visit  Medication Sig Dispense Refill   Calcium-Magnesium-Vitamin D (CALCIUM 500 PO) Take by mouth.     Cholecalciferol (VITAMIN D-3) 5000 units TABS Take 5,000 Units by mouth daily with supper.     clindamycin (CLINDAGEL) 1 % gel Apply topically 2 (two) times daily. 30 g 2   cyanocobalamin 1000 MCG tablet Take 1,000 mcg by mouth daily.     etodolac (LODINE) 400 MG tablet Take 400 mg by mouth 2 (two) times daily.     loratadine (CLARITIN REDITABS) 10 MG dissolvable tablet Take 10 mg by mouth daily.     Omega-3 Fatty Acids (FISH OIL PO) Take by mouth.     pseudoephedrine (SUDAFED) 120 MG 12 hr tablet Take 1 tablet (120 mg total) by mouth every 12 (twelve) hours as needed for congestion. 60 tablet 3   SELENIUM PO Take by mouth.     No current facility-administered medications on file prior to visit.    Review of Systems  Constitutional:  Negative for chills and fever.  Respiratory:  Negative for cough.   Cardiovascular:  Negative for chest pain and palpitations.  Gastrointestinal:  Negative for nausea and vomiting.      Objective:    BP 122/78   Pulse 62   Temp 97.9 F (36.6 C) (Oral)   Ht 5\' 5"  (1.651 m)   Wt 195 lb 3.2 oz (88.5 kg)   LMP 09/23/2016 (Exact Date)   SpO2 99%   BMI 32.48 kg/m  BP Readings from Last 3 Encounters:  08/20/23 122/78  04/20/23 (!) 154/84  04/07/23 135/87   Wt Readings from Last 3 Encounters:  08/20/23 195 lb 3.2 oz (88.5 kg)  04/20/23 193 lb 2 oz (87.6 kg)  04/07/23 190 lb 3.2 oz (86.3 kg)    Physical Exam Vitals reviewed.  Constitutional:      Appearance: She is well-developed.  Eyes:     Conjunctiva/sclera: Conjunctivae normal.  Cardiovascular:     Rate and Rhythm: Normal rate and regular rhythm.     Pulses: Normal pulses.     Heart sounds: Normal heart sounds.  Pulmonary:     Effort:  Pulmonary effort is normal.     Breath sounds: Normal breath sounds. No wheezing, rhonchi or rales.  Skin:    General: Skin is warm and dry.  Neurological:     Mental Status: She is alert.  Psychiatric:        Speech: Speech normal.        Behavior: Behavior normal.        Thought Content: Thought content normal.

## 2023-08-20 NOTE — Assessment & Plan Note (Signed)
Pending TSH.   Continue Synthroid 100 mcg

## 2023-08-20 NOTE — Assessment & Plan Note (Signed)
Long discussion in regards to the risk of hormone replacement therapy including breast cancer, ASCVD.  We also discussed history of a hysterectomy and therefore she would not require progesterone.  Reviewed visits with OB/GYN, Dr. Logan Bores.  Upon chart review, it appears progesterone 200 mg was sent in by Dr. Logan Bores however sig indicated vaginal route.  Patient was using vaginally; however it appears Dr. Logan Bores intended this to be p.o.  I have refilled progesterone 200 mg daily.  Patient will stop Dotti estrogen patch and instead I will increase estradiol to 1.25 mg daily.  Discussed with patient estrogen appropriate for shortest duration, lowest dose.  We will revisit how to wean off in the coming years.  Patient verbalized understanding of all.

## 2023-08-20 NOTE — Patient Instructions (Signed)
Increase estradiol to 1.25 mg May continue progesterone 200 mg by mouth  Nice to see you!

## 2023-09-05 ENCOUNTER — Encounter: Payer: Self-pay | Admitting: Family

## 2023-09-08 ENCOUNTER — Other Ambulatory Visit: Payer: Self-pay | Admitting: Family

## 2023-09-08 DIAGNOSIS — M542 Cervicalgia: Secondary | ICD-10-CM

## 2023-09-08 DIAGNOSIS — N951 Menopausal and female climacteric states: Secondary | ICD-10-CM

## 2023-09-08 DIAGNOSIS — M545 Low back pain, unspecified: Secondary | ICD-10-CM

## 2023-09-08 DIAGNOSIS — E039 Hypothyroidism, unspecified: Secondary | ICD-10-CM

## 2023-09-08 MED ORDER — SYNTHROID 100 MCG PO TABS
100.0000 ug | ORAL_TABLET | Freq: Every day | ORAL | 3 refills | Status: AC
Start: 2023-09-08 — End: ?

## 2023-09-08 MED ORDER — ESTRADIOL 1 MG PO TABS
1.0000 mg | ORAL_TABLET | Freq: Every day | ORAL | 3 refills | Status: AC
Start: 2023-09-08 — End: 2024-09-07

## 2023-09-08 MED ORDER — ESTRADIOL 0.5 MG PO TABS: 0.2500 mg | ORAL_TABLET | Freq: Every day | ORAL | 3 refills | Status: DC

## 2023-09-08 MED ORDER — PROGESTERONE 200 MG PO CAPS
200.0000 mg | ORAL_CAPSULE | Freq: Every day | ORAL | 3 refills | Status: AC
Start: 2023-09-08 — End: ?

## 2023-09-21 ENCOUNTER — Encounter: Payer: Self-pay | Admitting: Family

## 2023-09-22 ENCOUNTER — Other Ambulatory Visit: Payer: Self-pay | Admitting: Family

## 2023-09-22 DIAGNOSIS — R232 Flushing: Secondary | ICD-10-CM

## 2023-09-22 MED ORDER — ESTRADIOL 0.5 MG PO TABS
0.5000 mg | ORAL_TABLET | Freq: Every day | ORAL | 3 refills | Status: AC
Start: 2023-09-22 — End: ?

## 2023-09-30 ENCOUNTER — Ambulatory Visit (INDEPENDENT_AMBULATORY_CARE_PROVIDER_SITE_OTHER)

## 2023-09-30 ENCOUNTER — Ambulatory Visit (INDEPENDENT_AMBULATORY_CARE_PROVIDER_SITE_OTHER): Admitting: Podiatry

## 2023-09-30 ENCOUNTER — Encounter: Payer: Self-pay | Admitting: Podiatry

## 2023-09-30 DIAGNOSIS — M2042 Other hammer toe(s) (acquired), left foot: Secondary | ICD-10-CM | POA: Diagnosis not present

## 2023-09-30 DIAGNOSIS — D2372 Other benign neoplasm of skin of left lower limb, including hip: Secondary | ICD-10-CM

## 2023-09-30 NOTE — Progress Notes (Signed)
 Subjective:  Patient ID: Bethany Bailey, female    DOB: September 11, 1965,  MRN: [id number] HPI Chief Complaint  Patient presents with   Toe Pain    2nd toe left - hammertoe, callused at the tip, has noticed sharp pains x few months, redness occasionally at the knuckle   New Patient (Initial Visit)    Est pt 2020    58 y.o. female presents with the above complaint.   ROS: She denies fever chills nausea vomit muscle aches pains calf pain back pain chest pain shortness of breath.  Past Medical History:  Diagnosis Date   GERD (gastroesophageal reflux disease)    Hypothyroid    Past Surgical History:  Procedure Laterality Date   ABDOMINAL HYSTERECTOMY Bilateral 10/10/2016   Procedure: HYSTERECTOMY ABDOMINAL WITH BILATERAL SALPINGO OOPHERECTOMY;  Surgeon: Linzie Collin, MD;  Location: [facility] ORS;  Service: Gynecology;  Laterality: Bilateral;   BREAST BIOPSY Left 2005   benign   CERVICAL CONE BIOPSY  1990   COLONOSCOPY WITH PROPOFOL N/A 09/27/2021   Procedure: COLONOSCOPY WITH PROPOFOL;  Surgeon: Toney Reil, MD;  Location: [facility] ENDOSCOPY;  Service: Gastroenterology;  Laterality: N/A;   NOVASURE ABLATION  2012   TUBAL LIGATION  1999    Current Outpatient Medications:    Calcium-Magnesium-Vitamin D (CALCIUM 500 PO), Take by mouth., Disp: , Rfl:    Cholecalciferol (VITAMIN D-3) 5000 units TABS, Take 5,000 Units by mouth daily with supper., Disp: , Rfl:    clindamycin (CLINDAGEL) 1 % gel, Apply topically 2 (two) times daily., Disp: 30 g, Rfl: 2   cyanocobalamin 1000 MCG tablet, Take 1,000 mcg by mouth daily., Disp: , Rfl:    cyclobenzaprine (FLEXERIL) 10 MG tablet, Take 1 tablet (10 mg total) by mouth at bedtime as needed., Disp: 90 tablet, Rfl: 1   estradiol (ESTRACE) 0.5 MG tablet, Take 1 tablet (0.5 mg total) by mouth daily. Take with estradiol 1mg  qd, Disp: 90 tablet, Rfl: 3   estradiol (ESTRACE) 1 MG tablet, Take 1 tablet (1 mg total) by mouth daily. Take with estradiol 0.25mg   QD, Disp: 90 tablet, Rfl: 3   etodolac (LODINE) 400 MG tablet, Take 400 mg by mouth 2 (two) times daily., Disp: , Rfl:    loratadine (CLARITIN REDITABS) 10 MG dissolvable tablet, Take 10 mg by mouth daily., Disp: , Rfl:    Omega-3 Fatty Acids (FISH OIL PO), Take by mouth., Disp: , Rfl:    progesterone (PROMETRIUM) 200 MG capsule, Take 1 capsule (200 mg total) by mouth daily., Disp: 90 capsule, Rfl: 3   pseudoephedrine (SUDAFED) 120 MG 12 hr tablet, Take 1 tablet (120 mg total) by mouth every 12 (twelve) hours as needed for congestion., Disp: 60 tablet, Rfl: 3   SELENIUM PO, Take by mouth., Disp: , Rfl:    SYNTHROID 100 MCG tablet, Take 1 tablet (100 mcg total) by mouth daily before breakfast., Disp: 90 tablet, Rfl: 3  Allergies  Allergen Reactions   Aspirin Other (See Comments)    Ears ring   Erythromycin Nausea And Vomiting   Food     Wheat-coughing/sneezing & joint pain.   Ibuprofen Other (See Comments)    Every time she takes one dose, she gets stomach ache, dysuria.  This has happen multiple times.    Metronidazole Nausea Only    Paresthesias, dizziness   Sulfa Antibiotics Rash   Review of Systems Objective:  There were no vitals filed for this visit.  General: Well developed, nourished, in no acute distress, alert  and oriented x3   Dermatological: Skin is warm, dry and supple bilateral. Nails x 10 are well maintained; remaining integument appears unremarkable at this time. There are no open sores, no preulcerative lesions, no rash or signs of infection present.  Vascular: Dorsalis Pedis artery and Posterior Tibial artery pedal pulses are 2/4 bilateral with immedate capillary fill time. Pedal hair growth present. No varicosities and no lower extremity edema present bilateral.   Neruologic: Grossly intact via light touch bilateral. Vibratory intact via tuning fork bilateral. Protective threshold with Semmes Wienstein monofilament intact to all pedal sites bilateral. Patellar and  Achilles deep tendon reflexes 2+ bilateral. No Babinski or clonus noted bilateral.   Musculoskeletal: No gross boney pedal deformities bilateral. No pain, crepitus, or limitation noted with foot and ankle range of motion bilateral. Muscular strength 5/5 in all groups tested bilateral.  Tenderness on palpation of the PIPJ of the second digit left foot.  Small distal clavus is also noted here.  Noting to the second digits bilateral there is some osteoarthritic changes at the level of the PIPJ.    Gait: Unassisted, Nonantalgic.    Radiographs:  Radiographs taken today demonstrate osseously mature individual.  She has early osteoarthritic changes with the plantar most elongated second metatarsal.  Osteoarthritic changes are at the level of the PIPJ second toe left foot.  Assessment & Plan:   Assessment: Hammertoe deformity with osteoarthritic changes PIPJ second digit.  Plan: Discussed etiology pathology conservative surgical therapies at this point discussed topical therapy with her.  I debrided the callus to the tips of the toes.     Clema Skousen T. [place], [place]

## 2023-10-27 DIAGNOSIS — H16223 Keratoconjunctivitis sicca, not specified as Sjogren's, bilateral: Secondary | ICD-10-CM | POA: Diagnosis not present

## 2023-12-10 DIAGNOSIS — Z131 Encounter for screening for diabetes mellitus: Secondary | ICD-10-CM | POA: Diagnosis not present

## 2023-12-10 DIAGNOSIS — R5383 Other fatigue: Secondary | ICD-10-CM | POA: Diagnosis not present

## 2023-12-10 DIAGNOSIS — Z130 Encounter for screening for diseases of the blood and blood-forming organs and certain disorders involving the immune mechanism: Secondary | ICD-10-CM | POA: Diagnosis not present

## 2023-12-10 DIAGNOSIS — N951 Menopausal and female climacteric states: Secondary | ICD-10-CM | POA: Diagnosis not present

## 2023-12-10 DIAGNOSIS — Z13220 Encounter for screening for lipoid disorders: Secondary | ICD-10-CM | POA: Diagnosis not present

## 2023-12-17 DIAGNOSIS — R6882 Decreased libido: Secondary | ICD-10-CM | POA: Diagnosis not present

## 2023-12-21 ENCOUNTER — Encounter: Payer: Self-pay | Admitting: Family

## 2023-12-21 NOTE — Telephone Encounter (Signed)
 Advised pt to scheduled an appt and notify GI doctor as well

## 2023-12-25 ENCOUNTER — Ambulatory Visit: Payer: Self-pay

## 2023-12-25 ENCOUNTER — Ambulatory Visit (INDEPENDENT_AMBULATORY_CARE_PROVIDER_SITE_OTHER)

## 2023-12-25 VITALS — BP 122/80 | HR 77 | Temp 98.0°F | Ht 65.0 in | Wt 177.6 lb

## 2023-12-25 DIAGNOSIS — R197 Diarrhea, unspecified: Secondary | ICD-10-CM | POA: Diagnosis not present

## 2023-12-25 LAB — COMPREHENSIVE METABOLIC PANEL WITH GFR
ALT: 27 U/L (ref 0–35)
AST: 21 U/L (ref 0–37)
Albumin: 4.4 g/dL (ref 3.5–5.2)
Alkaline Phosphatase: 36 U/L — ABNORMAL LOW (ref 39–117)
BUN: 17 mg/dL (ref 6–23)
CO2: 29 meq/L (ref 19–32)
Calcium: 9.3 mg/dL (ref 8.4–10.5)
Chloride: 101 meq/L (ref 96–112)
Creatinine, Ser: 0.71 mg/dL (ref 0.40–1.20)
GFR: 95.09 mL/min (ref >60.00)
Glucose, Bld: 91 mg/dL (ref 70–99)
Potassium: 4.1 meq/L (ref 3.5–5.1)
Sodium: 138 meq/L (ref 135–145)
Total Bilirubin: 0.4 mg/dL (ref 0.2–1.2)
Total Protein: 7.4 g/dL (ref 6.0–8.3)

## 2023-12-25 NOTE — Progress Notes (Signed)
 Acute Office Visit  Subjective:    Patient ID: Bethany Bailey, female    DOB: Sep 09, 1965, 58 y.o.   MRN: [id number]  Chief Complaint  Patient presents with   Diarrhea   Patient is in today for following acute concerns:  Diarrhea: Watery BM started in the beginning of May. Reports when symptoms were worse she was having more than 6 episodes of watery bowel movement during day and 3-4 episodes at night. She has been taking Imodium from the onset of diarrhea which helps in reducing symptoms but not in fully resolving her symptoms.     Associated with cramps throughout all abdominal quadrant but more prominent around lower abdominal area. She also endorsed bloating, generalized abdominal tenderness, intermittent nausea throughout this course. Diarrhea triggered by food.   She changed her diet (around 12/14/23), cutting down on intake of sugar/carbohydrate/diary products and has noted significant improvement in her symptoms. She has not taken Imodium for the last 3 days. Has a h/o IBS, during last flare up took VSL #3 probiotic which helped.  She went through bad breakup recently. 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  She has a h/o hiatal hernia and denies GERD symptoms. Denies blood, mucous in the stool. No vomiting, fever, chills, unintentional weight loss. No previous GI surgery.  She also started Testosterone  earlier this week by her ob/gyn.    Review of Systems  Constitutional:  Negative for chills and fever.  Respiratory:  Negative for cough.   Gastrointestinal:  Positive for diarrhea and nausea. Negative for blood in stool, constipation, heartburn, melena and vomiting.  Musculoskeletal:  Negative for myalgias.   As per HPI    Objective:    BP 122/80   Pulse 77   Temp 98 F (36.7 C) (Oral)   Ht 5' 5 (1.651 m)   Wt 177 lb 9.6 oz (80.6 kg)   LMP 09/23/2016 (Exact Date)   SpO2 97%   BMI 29.55 kg/m    Physical Exam Vitals reviewed.  Constitutional:       General: She is not in acute distress.    Appearance: Normal appearance. She is not toxic-appearing.  HENT:     Head: Normocephalic and atraumatic.     Mouth/Throat:     Mouth: Mucous membranes are moist.  Neck:     Thyroid : No thyroid  mass or thyroid  tenderness.   Cardiovascular:     Rate and Rhythm: Normal rate and regular rhythm.  Pulmonary:     Effort: Pulmonary effort is normal.     Breath sounds: Normal breath sounds.  Abdominal:     General: Abdomen is protuberant. Bowel sounds are normal.     Palpations: Abdomen is soft.     Tenderness: There is no abdominal tenderness. There is no guarding or rebound. Negative signs include Murphy's sign and McBurney's sign.   Musculoskeletal:     Cervical back: Neck supple. No rigidity or tenderness.     Right lower leg: No edema.     Left lower leg: No edema.   Skin:    General: Skin is warm.   Neurological:     Mental Status: She is alert and oriented to person, place, and time.   Psychiatric:        Mood and Affect: Mood normal.        Behavior: Behavior normal.    No results found for any visits on 12/25/23.     Assessment & Plan:  Diarrhea, unspecified type Assessment &  Plan: Differential diagnosis includes diarrhea predominant IBS, IBD, malabsorption, medication induced (On Progesterone , testosterone ), colitis, hyperthyroidism.   Given h/o IBS with pertinent positive of symptoms improvement with diet change, presence of stressor and negative red flag symptoms diarrhea is likely IBS related. Recommend continuing avoidance of trigger foods, increased fiber intake, take daily probiotic, keep symptom journal if patient were to restart certain food group in her diet, stress management, staying hydrated. Recommend checking CMP today. Reviewed normal TSH from 08/2023.   Also discussed checking for fecal calprotectin but since patient has noted improvement in symptoms shared decision made to hold off on fecal test at this  time. Patient also counseled to consider GI evaluation if symptoms persists despite conservative management.    Orders: -     Comprehensive metabolic panel with GFR  I spent 30 minutes on the day of this face-to-face encounter reviewing the patient's medical and surgical history, current medications, ongoing concerns, and reviewing the assessment and plan with the patient.  Additionally, I spent time post-visit ordering and reviewing diagnostics and therapeutics with the patient.   Return if symptoms worsen or fail to improve.  Jacklin Mascot, MD

## 2023-12-25 NOTE — Assessment & Plan Note (Signed)
 Differential diagnosis includes diarrhea predominant IBS, IBD, malabsorption, medication induced (On Progesterone , testosterone ), colitis, hyperthyroidism.   Given h/o IBS with pertinent positive of symptoms improvement with diet change, presence of stressor and negative red flag symptoms diarrhea is likely IBS related. Recommend continuing avoidance of trigger foods, increased fiber intake, take daily probiotic, keep symptom journal if patient were to restart certain food group in her diet, stress management, staying hydrated. Recommend checking CMP today. Reviewed normal TSH from 08/2023.   Also discussed checking for fecal calprotectin but since patient has noted improvement in symptoms shared decision made to hold off on fecal test at this time. Patient also counseled to consider GI evaluation if symptoms persists despite conservative management.

## 2024-01-01 ENCOUNTER — Other Ambulatory Visit

## 2024-01-12 ENCOUNTER — Telehealth: Payer: Self-pay

## 2024-01-12 NOTE — Telephone Encounter (Signed)
 Patient notified via My Chart

## 2024-01-12 NOTE — Telephone Encounter (Signed)
 Copied from CRM [id number]. Topic: Clinical - Request for Lab/Test Order >> Jan 12, 2024 10:05 AM Macario HERO wrote: Reason for CRM: Patient stated she saw Dr. Abbey in June regarding a fecal test and patient stated she would like to do it. Can be reached over on MyChart.

## 2024-01-12 NOTE — Telephone Encounter (Signed)
 We mutually decided not to do stool test during her appointment in 12/25/23 as her symptoms were improved. Please let the patient know if she is having change in bowel movement eg- diarrhea I recommend following up with PCP.   Thank you,  Luke Shade, MD

## 2024-01-15 ENCOUNTER — Encounter: Payer: Self-pay | Admitting: Family

## 2024-01-18 ENCOUNTER — Encounter: Payer: Self-pay | Admitting: Family

## 2024-01-18 ENCOUNTER — Ambulatory Visit: Admitting: Family

## 2024-01-18 VITALS — BP 130/72 | HR 84 | Temp 98.5°F | Ht 65.5 in | Wt 173.8 lb

## 2024-01-18 DIAGNOSIS — R197 Diarrhea, unspecified: Secondary | ICD-10-CM

## 2024-01-18 LAB — TSH: TSH: 0.56 u[IU]/mL (ref 0.35–5.50)

## 2024-01-18 LAB — C-REACTIVE PROTEIN: CRP: <1 mg/dL (ref 0.5–20.0)

## 2024-01-18 NOTE — Progress Notes (Signed)
 Assessment & Plan:  Diarrhea, unspecified type Assessment & Plan: Afebrile.  Well-appearing today.  Etiology of diarrhea unknown at this time.  Some improvement with elimination of dairy products, carbohydrates, sugar and gluten.  Discussed IBS, FODMAP diet.  Pending labs.  Referral to GI  Orders: -     Ambulatory referral to Gastroenterology -     C Difficile Quick Screen w PCR reflex; Future -     Ova and parasite examination; Future -     C-reactive protein -     GI pathogen panel by PCR, stool; Future -     TSH -     H. pylori breath test     Return precautions given.   Risks, benefits, and alternatives of the medications and treatment plan prescribed today were discussed, and patient expressed understanding.   Education regarding symptom management and diagnosis given to patient on AVS either electronically or printed.  No follow-ups on file.  Rollene Northern, FNP  Subjective:    Patient ID: Bethany Bailey, female    DOB: 01-14-1966, 58 y.o.   MRN: [id number]  CC: Bethany Bailey is a 58 y.o. female who presents today for follow up.   HPI: Complains of watery brown nonbloody diarrhea Sxs started in May after a breakfast casserole.  She started VSL brand probiotics for one month, which initially improved and then symptoms recurred.  In 2012 has similar diarrhea, which resolved with eliminiation dairy, carb, sugar, gluten  started June 9th     She has been taking imodium prn, last dose 3 days ago.   A/w abdominal bloating, nausea, and diffuse constant ache   Diarrhea triggered by food   No diarrhea today. Denies fever, chills, vomiting  Suspected h/o IBS  TSH normal 08/2023  No recent travel.   No recent antibiotics  H/o hypothyroidism, NAFLD  Colonoscopy 09/27/21 Dr unk ; repeat in 7 years  Allergies: Aspirin, Erythromycin, Food, Ibuprofen , Metronidazole , and Sulfa antibiotics Current Outpatient Medications on File Prior to Visit  Medication Sig  Dispense Refill   Calcium-Magnesium -Vitamin D  (CALCIUM 500 PO) Take by mouth.     Cholecalciferol (VITAMIN D -3) 5000 units TABS Take 5,000 Units by mouth daily with supper.     clindamycin  (CLINDAGEL) 1 % gel Apply topically 2 (two) times daily. 30 g 2   cyanocobalamin  1000 MCG tablet Take 1,000 mcg by mouth daily.     cyclobenzaprine  (FLEXERIL ) 10 MG tablet Take 1 tablet (10 mg total) by mouth at bedtime as needed. 90 tablet 1   estradiol  (ESTRACE ) 0.5 MG tablet Take 1 tablet (0.5 mg total) by mouth daily. Take with estradiol  1mg  qd 90 tablet 3   estradiol  (ESTRACE ) 1 MG tablet Take 1 tablet (1 mg total) by mouth daily. Take with estradiol  0.25mg  QD 90 tablet 3   etodolac (LODINE) 400 MG tablet Take 400 mg by mouth 2 (two) times daily.     loratadine (CLARITIN REDITABS) 10 MG dissolvable tablet Take 10 mg by mouth daily.     Omega-3 Fatty Acids (FISH OIL PO) Take by mouth.     progesterone  (PROMETRIUM ) 200 MG capsule Take 1 capsule (200 mg total) by mouth daily. 90 capsule 3   pseudoephedrine  (SUDAFED) 120 MG 12 hr tablet Take 1 tablet (120 mg total) by mouth every 12 (twelve) hours as needed for congestion. 60 tablet 3   SELENIUM PO Take by mouth.     SYNTHROID  100 MCG tablet Take 1 tablet (100 mcg total) by mouth  daily before breakfast. 90 tablet 3   Testosterone  20.25 MG/ACT (1.62%) GEL SMARTSIG:1 pump T-DERMAL Daily     No current facility-administered medications on file prior to visit.    Review of Systems  Constitutional:  Negative for chills and fever.  Respiratory:  Negative for cough.   Cardiovascular:  Negative for chest pain and palpitations.  Gastrointestinal:  Positive for abdominal distention and diarrhea. Negative for constipation, nausea and vomiting.  Genitourinary:  Negative for difficulty urinating.      Objective:    BP 130/72   Pulse 84   Temp 98.5 F (36.9 C) (Oral)   Ht 5' 5.5 (1.664 m)   Wt 173 lb 12.8 oz (78.8 kg)   LMP 09/23/2016 (Exact Date)   SpO2  98%   BMI 28.48 kg/m  BP Readings from Last 3 Encounters:  01/18/24 130/72  12/25/23 122/80  08/20/23 122/78   Wt Readings from Last 3 Encounters:  01/18/24 173 lb 12.8 oz (78.8 kg)  12/25/23 177 lb 9.6 oz (80.6 kg)  08/20/23 195 lb 3.2 oz (88.5 kg)    Physical Exam Vitals reviewed.  Constitutional:      Appearance: Normal appearance. She is well-developed.  Eyes:     Conjunctiva/sclera: Conjunctivae normal.  Cardiovascular:     Rate and Rhythm: Normal rate and regular rhythm.     Pulses: Normal pulses.     Heart sounds: Normal heart sounds.  Pulmonary:     Effort: Pulmonary effort is normal.     Breath sounds: Normal breath sounds. No wheezing, rhonchi or rales.  Abdominal:     General: Bowel sounds are normal. There is no distension.     Palpations: Abdomen is soft. Abdomen is not rigid. There is no fluid wave or mass.     Tenderness: There is no abdominal tenderness. There is no guarding or rebound.  Skin:    General: Skin is warm and dry.  Neurological:     Mental Status: She is alert.  Psychiatric:        Speech: Speech normal.        Behavior: Behavior normal.        Thought Content: Thought content normal.

## 2024-01-19 ENCOUNTER — Encounter: Payer: Self-pay | Admitting: Family

## 2024-01-19 ENCOUNTER — Ambulatory Visit: Payer: Self-pay | Admitting: Family

## 2024-01-19 ENCOUNTER — Other Ambulatory Visit
Admission: RE | Admit: 2024-01-19 | Discharge: 2024-01-19 | Disposition: A | Discharge status: Home / Self Care | Source: Ambulatory Visit | Attending: Family | Admitting: Family

## 2024-01-19 DIAGNOSIS — R197 Diarrhea, unspecified: Secondary | ICD-10-CM | POA: Insufficient documentation

## 2024-01-19 LAB — GASTROINTESTINAL PANEL BY PCR, STOOL (REPLACES STOOL CULTURE)

## 2024-01-19 LAB — C DIFFICILE QUICK SCREEN W PCR REFLEX
C Diff antigen: NEGATIVE
C Diff interpretation: NEGATIVE
C Diff toxin: NEGATIVE

## 2024-01-19 LAB — H. PYLORI BREATH TEST: H. pylori Breath Test: NOT DETECTED

## 2024-01-20 ENCOUNTER — Telehealth: Payer: Self-pay | Admitting: Family

## 2024-01-20 ENCOUNTER — Other Ambulatory Visit: Payer: Self-pay | Admitting: Family

## 2024-01-20 ENCOUNTER — Ambulatory Visit: Payer: Self-pay | Admitting: Family

## 2024-01-20 DIAGNOSIS — R197 Diarrhea, unspecified: Secondary | ICD-10-CM

## 2024-01-20 NOTE — Telephone Encounter (Signed)
 Call [facility] Can fecal calprotectin lab be added on to stool?

## 2024-01-20 NOTE — Telephone Encounter (Signed)
 Called [facility] at [phone] to ask about lab request per provider and was place on hold for 15 minutes and never received an answer. Please attempt again tomorrow

## 2024-01-21 ENCOUNTER — Other Ambulatory Visit
Admission: RE | Admit: 2024-01-21 | Discharge: 2024-01-21 | Disposition: A | Discharge status: Home / Self Care | Source: Ambulatory Visit | Attending: Family | Admitting: Family

## 2024-01-21 DIAGNOSIS — R197 Diarrhea, unspecified: Secondary | ICD-10-CM | POA: Diagnosis not present

## 2024-01-21 LAB — OVA + PARASITE EXAM

## 2024-01-21 LAB — O&P RESULT

## 2024-01-21 NOTE — Telephone Encounter (Signed)
 Spoke with [facility]. They are checking to see if test can be added. Need to confirm they have the correct tube and it is still in the time frame to add on. They will call back if problems but if not, test will be added on.

## 2024-01-22 ENCOUNTER — Encounter: Payer: Self-pay | Admitting: Family

## 2024-01-22 ENCOUNTER — Other Ambulatory Visit: Payer: Self-pay | Admitting: Family

## 2024-01-22 DIAGNOSIS — K649 Unspecified hemorrhoids: Secondary | ICD-10-CM

## 2024-01-22 LAB — CALPROTECTIN, FECAL: Calprotectin, Fecal: 5 ug/g (ref 0–120)

## 2024-01-22 MED ORDER — HYDROCORTISONE ACETATE 25 MG RE SUPP: 25.0000 mg | Freq: Two times a day (BID) | RECTAL | 0 refills | Status: AC

## 2024-01-22 NOTE — Telephone Encounter (Signed)
 Pt had a visit with you on 7/14, is there something you can prescribe for hemorrhoids?

## 2024-01-24 ENCOUNTER — Ambulatory Visit: Payer: Self-pay | Admitting: Family

## 2024-01-24 NOTE — Patient Instructions (Addendum)
 Referral to GI.  Let us  know if you dont hear back within a week in regards to an appointment being scheduled.   So that you are aware, if you are [facility] MyChart user , please pay attention to your MyChart messages as you may receive a MyChart message with a phone number to call and schedule this test/appointment own your own from our referral coordinator. This is a new process so I do not want you to miss this message.  If you are not a MyChart user, you will receive a phone call.    Low-FODMAP Eating Plan  FODMAP stands for fermentable oligosaccharides, disaccharides, monosaccharides, and polyols. These are sugars that are hard for some people to digest. A low-FODMAP eating plan may help some people who have irritable bowel syndrome (IBS) and certain other bowel (intestinal) diseases to manage their symptoms. This meal plan can be complicated to follow. Work with a diet and nutrition specialist (dietitian) to make a low-FODMAP eating plan that is right for you. A dietitian can help make sure that you get enough nutrition from this diet. What are tips for following this plan? Reading food labels Check labels for hidden FODMAPs such as: High-fructose syrup. Honey. Agave. Natural fruit flavors. Onion or garlic powder. Choose low-FODMAP foods that contain 3-4 grams of fiber per serving. Check food labels for serving sizes. Eat only one serving at a time to make sure FODMAP levels stay low. Shopping Shop with a list of foods that are recommended on this diet and make a meal plan. Meal planning Follow a low-FODMAP eating plan for up to 6 weeks, or as told by your health care provider or dietitian. To follow the eating plan: Eliminate high-FODMAP foods from your diet completely. Choose only low-FODMAP foods to eat. You will do this for 2-6 weeks. Gradually reintroduce high-FODMAP foods into your diet one at a time. Most people should wait a few days before introducing the next new high-FODMAP food  into their meal plan. Your dietitian can recommend how quickly you may reintroduce foods. Keep a daily record of what and how much you eat and drink. Make note of any symptoms that you have after eating. Review your daily record with a dietitian regularly to identify which foods you can eat and which foods you should avoid. General tips Drink enough fluid each day to keep your urine pale yellow. Avoid processed foods. These often have added sugar and may be high in FODMAPs. Avoid most dairy products, whole grains, and sweeteners. Work with a dietitian to make sure you get enough fiber in your diet. Avoid high FODMAP foods at meals to manage symptoms. Recommended foods Fruits Bananas, oranges, tangerines, lemons, limes, blueberries, raspberries, strawberries, grapes, cantaloupe, honeydew melon, kiwi, papaya, passion fruit, and pineapple. Limited amounts of dried cranberries, banana chips, and shredded coconut. Vegetables Eggplant, zucchini, cucumber, peppers, green beans, bean sprouts, lettuce, arugula, kale, Swiss chard, spinach, collard greens, bok choy, summer squash, potato, and tomato. Limited amounts of corn, carrot, and sweet potato. Green parts of scallions. Grains Gluten-free grains, such as rice, oats, buckwheat, quinoa, corn, polenta, and millet. Gluten-free pasta, bread, or cereal. Rice noodles. Corn tortillas. Meats and other proteins Unseasoned beef, pork, poultry, or fish. Eggs. [place]. Tofu (firm) and tempeh. Limited amounts of nuts and seeds, such as almonds, walnuts, [place] nuts, pecans, peanuts, nut butters, pumpkin seeds, chia seeds, and sunflower seeds. Dairy Lactose-free milk, yogurt, and kefir. Lactose-free cottage cheese and ice cream. Non-dairy milks, such as almond, coconut,  hemp, and rice milk. Non-dairy yogurt. Limited amounts of goat cheese, brie, mozzarella, parmesan, swiss, and other hard cheeses. Fats and oils Butter-free spreads. Vegetable oils, such as olive,  canola, and sunflower oil. Seasoning and other foods Artificial sweeteners with names that do not end in ol, such as aspartame, saccharine, and stevia. Maple syrup, white table sugar, raw sugar, brown sugar, and molasses. Mayonnaise, soy sauce, and tamari. Fresh basil, coriander, parsley, rosemary, and thyme. Beverages Water and mineral water. Sugar-sweetened soft drinks. Small amounts of orange juice or cranberry juice. Black and green tea. Most dry wines. Coffee. The items listed above may not be a complete list of foods and beverages you can eat. Contact a dietitian for more information. Foods to avoid Fruits Fresh, dried, and juiced forms of apple, pear, watermelon, peach, plum, cherries, apricots, blackberries, boysenberries, figs, nectarines, and mango. Avocado. Vegetables Chicory root, artichoke, asparagus, cabbage, snow peas, Brussels sprouts, broccoli, sugar snap peas, mushrooms, celery, and cauliflower. Onions, garlic, leeks, and the white part of scallions. Grains Wheat, including kamut, durum, and semolina. Barley and bulgur. Couscous. Wheat-based cereals. Wheat noodles, bread, crackers, and pastries. Meats and other proteins Fried or fatty meat. Sausage. Cashews and pistachios. Soybeans, baked beans, black beans, chickpeas, kidney beans, fava beans, navy beans, lentils, black-eyed peas, and split peas. Dairy Milk, yogurt, ice cream, and soft cheese. Cream and sour cream. Milk-based sauces. Custard. Buttermilk. Soy milk. Seasoning and other foods Any sugar-free gum or candy. Foods that contain artificial sweeteners such as sorbitol, mannitol, isomalt, or xylitol. Foods that contain honey, high-fructose corn syrup, or agave. Bouillon, vegetable stock, beef stock, and chicken stock. Garlic and onion powder. Condiments made with onion, such as hummus, chutney, pickles, relish, salad dressing, and salsa. Tomato paste. Beverages Chicory-based drinks. Coffee substitutes. Chamomile tea.  Fennel tea. Sweet or fortified wines such as port or sherry. Diet soft drinks made with isomalt, mannitol, maltitol, sorbitol, or xylitol. Apple, pear, and mango juice. Juices with high-fructose corn syrup. The items listed above may not be a complete list of foods and beverages you should avoid. Contact a dietitian for more information. Summary FODMAP stands for fermentable oligosaccharides, disaccharides, monosaccharides, and polyols. These are sugars that are hard for some people to digest. A low-FODMAP eating plan is a short-term diet that helps to ease symptoms of certain bowel diseases. The eating plan usually lasts up to 6 weeks. After that, high-FODMAP foods are reintroduced gradually and one at a time. This can help you find out which foods may be causing symptoms. A low-FODMAP eating plan can be complicated. It is best to work with a dietitian who has experience with this type of plan. This information is not intended to replace advice given to you by your health care provider. Make sure you discuss any questions you have with your health care provider. Document Revised: 06/07/2023 Document Reviewed: 06/07/2023 Elsevier Patient Education  2025 [employer].

## 2024-01-24 NOTE — Assessment & Plan Note (Signed)
 Afebrile.  Well-appearing today.  Etiology of diarrhea unknown at this time.  Some improvement with elimination of dairy products, carbohydrates, sugar and gluten.  Discussed IBS, FODMAP diet.  Pending labs.  Referral to GI

## 2024-02-03 ENCOUNTER — Ambulatory Visit: Admitting: Family

## 2024-02-12 ENCOUNTER — Ambulatory Visit

## 2024-02-12 ENCOUNTER — Other Ambulatory Visit (HOSPITAL_COMMUNITY)
Admission: RE | Admit: 2024-02-12 | Discharge: 2024-02-12 | Disposition: A | Discharge status: Home / Self Care | Source: Ambulatory Visit | Attending: Certified Nurse Midwife | Admitting: Certified Nurse Midwife

## 2024-02-12 VITALS — BP 114/88 | HR 73 | Ht 65.0 in | Wt 176.8 lb

## 2024-02-12 DIAGNOSIS — Z113 Encounter for screening for infections with a predominantly sexual mode of transmission: Secondary | ICD-10-CM

## 2024-02-12 DIAGNOSIS — N760 Acute vaginitis: Secondary | ICD-10-CM | POA: Diagnosis not present

## 2024-02-12 DIAGNOSIS — B9689 Other specified bacterial agents as the cause of diseases classified elsewhere: Secondary | ICD-10-CM | POA: Diagnosis not present

## 2024-02-12 NOTE — Patient Instructions (Signed)
  Vaginitis You will learn about the causes symptoms, and treatment for the 3 main types of vaginitis; vaginosis, yeast infection and trichomoniasis. To view the content, go to this web address: [email or web address]  This video will expire on: 06/17/2025. If you need access to this video following this date, please reach out to the healthcare provider who assigned it to you. This information is not intended to replace advice given to you by your health care provider. Make sure you discuss any questions you have with your health care provider. [employer] Patient Education  2024 [employer].

## 2024-02-12 NOTE — Progress Notes (Signed)
    NURSE VISIT NOTE  Subjective:    Patient ID: Bethany Bailey, female    DOB: 12-27-1965, 58 y.o.   MRN: [id number]  HPI  Patient is a 58 y.o. G0P0000 female who presents for vaginal irritation/discomfort for 3-4 week(s) and has tried boric acid.  Denies abnormal vaginal bleeding or significant pelvic pain or fever. denies dysuria, hematuria, urinary frequency, urinary urgency, flank pain, abdominal pain, pelvic pain, cloudy malordorous urine, and vaginal discharge. Patient denies history of known exposure to STD but reports history of HPV.   Objective:    BP 114/88   Pulse 73   Ht 5' 5 (1.651 m)   Wt 176 lb 12.8 oz (80.2 kg)   LMP 09/23/2016 (Exact Date)   BMI 29.42 kg/m     Assessment:   1. Acute vaginitis   2. Screening examination for STD (sexually transmitted disease)     rule out GC or chlamydia and nonspecific vaginitis  Plan:   GC and chlamydia DNA  probe sent to lab.  ROV prn if symptoms persist or worsen.   Rollo JINNY Maxin, CMA

## 2024-02-15 ENCOUNTER — Encounter: Payer: Self-pay | Admitting: Certified Nurse Midwife

## 2024-02-15 LAB — CERVICOVAGINAL ANCILLARY ONLY
Bacterial Vaginitis (gardnerella): NEGATIVE
Candida Glabrata: NEGATIVE
Candida Vaginitis: NEGATIVE
Chlamydia: NEGATIVE
Comment: NEGATIVE
Comment: NEGATIVE
Comment: NEGATIVE
Comment: NEGATIVE
Comment: NEGATIVE
Comment: NORMAL
Neisseria Gonorrhea: NEGATIVE
Trichomonas: NEGATIVE

## 2024-02-25 DIAGNOSIS — L84 Corns and callosities: Secondary | ICD-10-CM | POA: Diagnosis not present

## 2024-02-25 DIAGNOSIS — L821 Other seborrheic keratosis: Secondary | ICD-10-CM | POA: Diagnosis not present

## 2024-02-25 DIAGNOSIS — D2271 Melanocytic nevi of right lower limb, including hip: Secondary | ICD-10-CM | POA: Diagnosis not present

## 2024-02-25 DIAGNOSIS — D2272 Melanocytic nevi of left lower limb, including hip: Secondary | ICD-10-CM | POA: Diagnosis not present

## 2024-02-25 DIAGNOSIS — D2261 Melanocytic nevi of right upper limb, including shoulder: Secondary | ICD-10-CM | POA: Diagnosis not present

## 2024-02-25 DIAGNOSIS — D2262 Melanocytic nevi of left upper limb, including shoulder: Secondary | ICD-10-CM | POA: Diagnosis not present

## 2024-02-25 DIAGNOSIS — D225 Melanocytic nevi of trunk: Secondary | ICD-10-CM | POA: Diagnosis not present

## 2024-02-25 DIAGNOSIS — L739 Follicular disorder, unspecified: Secondary | ICD-10-CM | POA: Diagnosis not present

## 2024-03-04 DIAGNOSIS — N951 Menopausal and female climacteric states: Secondary | ICD-10-CM | POA: Diagnosis not present
